# Patient Record
Sex: Female | Born: 1960 | Race: White | Hispanic: No | Smoking: Current every day smoker
Health system: Southern US, Community
[De-identification: ages and names within clinical notes are randomized; demographics above are authoritative.]

## PROBLEM LIST (undated history)

## (undated) DIAGNOSIS — Z973 Presence of spectacles and contact lenses: Secondary | ICD-10-CM

## (undated) DIAGNOSIS — F419 Anxiety disorder, unspecified: Secondary | ICD-10-CM

## (undated) DIAGNOSIS — L732 Hidradenitis suppurativa: Secondary | ICD-10-CM

## (undated) DIAGNOSIS — Z8719 Personal history of other diseases of the digestive system: Secondary | ICD-10-CM

## (undated) DIAGNOSIS — I10 Essential (primary) hypertension: Secondary | ICD-10-CM

## (undated) DIAGNOSIS — F319 Bipolar disorder, unspecified: Secondary | ICD-10-CM

## (undated) DIAGNOSIS — E042 Nontoxic multinodular goiter: Secondary | ICD-10-CM

## (undated) DIAGNOSIS — E785 Hyperlipidemia, unspecified: Secondary | ICD-10-CM

## (undated) DIAGNOSIS — R7303 Prediabetes: Secondary | ICD-10-CM

## (undated) DIAGNOSIS — F329 Major depressive disorder, single episode, unspecified: Secondary | ICD-10-CM

## (undated) DIAGNOSIS — C50919 Malignant neoplasm of unspecified site of unspecified female breast: Secondary | ICD-10-CM

## (undated) DIAGNOSIS — M797 Fibromyalgia: Secondary | ICD-10-CM

## (undated) DIAGNOSIS — K219 Gastro-esophageal reflux disease without esophagitis: Secondary | ICD-10-CM

## (undated) DIAGNOSIS — F32A Depression, unspecified: Secondary | ICD-10-CM

## (undated) DIAGNOSIS — G4730 Sleep apnea, unspecified: Secondary | ICD-10-CM

## (undated) DIAGNOSIS — M1990 Unspecified osteoarthritis, unspecified site: Secondary | ICD-10-CM

## (undated) HISTORY — DX: Malignant neoplasm of unspecified site of unspecified female breast: C50.919

## (undated) HISTORY — PX: TUBAL LIGATION: SHX77

## (undated) HISTORY — PX: CHOLECYSTECTOMY: SHX55

## (undated) HISTORY — PX: GALLBLADDER SURGERY: SHX652

## (undated) HISTORY — DX: Hidradenitis suppurativa: L73.2

---
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
## 1998-06-07 HISTORY — PX: SHOULDER ARTHROSCOPY: SHX128

## 2007-06-08 HISTORY — PX: BREAST BIOPSY: SHX20

## 2011-04-08 HISTORY — PX: ABLATION: SHX5711

## 2011-06-18 DIAGNOSIS — F3131 Bipolar disorder, current episode depressed, mild: Secondary | ICD-10-CM | POA: Diagnosis not present

## 2011-07-19 DIAGNOSIS — F3131 Bipolar disorder, current episode depressed, mild: Secondary | ICD-10-CM | POA: Diagnosis not present

## 2011-08-09 DIAGNOSIS — E785 Hyperlipidemia, unspecified: Secondary | ICD-10-CM | POA: Diagnosis not present

## 2011-08-09 DIAGNOSIS — E559 Vitamin D deficiency, unspecified: Secondary | ICD-10-CM | POA: Diagnosis not present

## 2011-08-09 DIAGNOSIS — E039 Hypothyroidism, unspecified: Secondary | ICD-10-CM | POA: Diagnosis not present

## 2011-08-09 DIAGNOSIS — E7800 Pure hypercholesterolemia, unspecified: Secondary | ICD-10-CM | POA: Diagnosis not present

## 2011-08-09 DIAGNOSIS — T887XXA Unspecified adverse effect of drug or medicament, initial encounter: Secondary | ICD-10-CM | POA: Diagnosis not present

## 2011-08-09 DIAGNOSIS — D649 Anemia, unspecified: Secondary | ICD-10-CM | POA: Diagnosis not present

## 2011-08-17 DIAGNOSIS — F3131 Bipolar disorder, current episode depressed, mild: Secondary | ICD-10-CM | POA: Diagnosis not present

## 2011-09-20 DIAGNOSIS — D1801 Hemangioma of skin and subcutaneous tissue: Secondary | ICD-10-CM | POA: Diagnosis not present

## 2011-09-20 DIAGNOSIS — L42 Pityriasis rosea: Secondary | ICD-10-CM | POA: Diagnosis not present

## 2011-09-27 DIAGNOSIS — L42 Pityriasis rosea: Secondary | ICD-10-CM | POA: Diagnosis not present

## 2011-09-27 DIAGNOSIS — F3131 Bipolar disorder, current episode depressed, mild: Secondary | ICD-10-CM | POA: Diagnosis not present

## 2011-09-28 ENCOUNTER — Ambulatory Visit: Payer: Self-pay | Admitting: Pain Medicine

## 2011-09-28 DIAGNOSIS — M25519 Pain in unspecified shoulder: Secondary | ICD-10-CM | POA: Diagnosis not present

## 2011-09-28 DIAGNOSIS — Z7982 Long term (current) use of aspirin: Secondary | ICD-10-CM | POA: Diagnosis not present

## 2011-09-28 DIAGNOSIS — K449 Diaphragmatic hernia without obstruction or gangrene: Secondary | ICD-10-CM | POA: Diagnosis not present

## 2011-09-28 DIAGNOSIS — M542 Cervicalgia: Secondary | ICD-10-CM | POA: Diagnosis not present

## 2011-09-28 DIAGNOSIS — M549 Dorsalgia, unspecified: Secondary | ICD-10-CM | POA: Diagnosis not present

## 2011-09-28 DIAGNOSIS — R51 Headache: Secondary | ICD-10-CM | POA: Diagnosis not present

## 2011-09-28 DIAGNOSIS — N200 Calculus of kidney: Secondary | ICD-10-CM | POA: Diagnosis not present

## 2011-09-28 DIAGNOSIS — Z79899 Other long term (current) drug therapy: Secondary | ICD-10-CM | POA: Diagnosis not present

## 2011-09-29 ENCOUNTER — Ambulatory Visit: Payer: Self-pay | Admitting: Internal Medicine

## 2011-09-29 DIAGNOSIS — Z1231 Encounter for screening mammogram for malignant neoplasm of breast: Secondary | ICD-10-CM | POA: Diagnosis not present

## 2011-10-06 ENCOUNTER — Ambulatory Visit: Payer: Self-pay | Admitting: Pain Medicine

## 2011-10-06 DIAGNOSIS — I10 Essential (primary) hypertension: Secondary | ICD-10-CM | POA: Diagnosis not present

## 2011-10-06 DIAGNOSIS — E785 Hyperlipidemia, unspecified: Secondary | ICD-10-CM | POA: Diagnosis not present

## 2011-10-06 DIAGNOSIS — R51 Headache: Secondary | ICD-10-CM | POA: Diagnosis not present

## 2011-10-06 DIAGNOSIS — M545 Low back pain, unspecified: Secondary | ICD-10-CM | POA: Diagnosis not present

## 2011-10-06 DIAGNOSIS — M75100 Unspecified rotator cuff tear or rupture of unspecified shoulder, not specified as traumatic: Secondary | ICD-10-CM | POA: Diagnosis not present

## 2011-10-06 DIAGNOSIS — S83006A Unspecified dislocation of unspecified patella, initial encounter: Secondary | ICD-10-CM | POA: Diagnosis not present

## 2011-10-06 DIAGNOSIS — M546 Pain in thoracic spine: Secondary | ICD-10-CM | POA: Diagnosis not present

## 2011-10-06 DIAGNOSIS — M5412 Radiculopathy, cervical region: Secondary | ICD-10-CM | POA: Diagnosis not present

## 2011-10-06 DIAGNOSIS — IMO0002 Reserved for concepts with insufficient information to code with codable children: Secondary | ICD-10-CM | POA: Diagnosis not present

## 2011-10-06 DIAGNOSIS — M542 Cervicalgia: Secondary | ICD-10-CM | POA: Diagnosis not present

## 2011-10-06 DIAGNOSIS — Z79899 Other long term (current) drug therapy: Secondary | ICD-10-CM | POA: Diagnosis not present

## 2011-10-07 DIAGNOSIS — S83006A Unspecified dislocation of unspecified patella, initial encounter: Secondary | ICD-10-CM | POA: Diagnosis not present

## 2011-10-07 DIAGNOSIS — E785 Hyperlipidemia, unspecified: Secondary | ICD-10-CM | POA: Diagnosis not present

## 2011-10-07 DIAGNOSIS — M75100 Unspecified rotator cuff tear or rupture of unspecified shoulder, not specified as traumatic: Secondary | ICD-10-CM | POA: Diagnosis not present

## 2011-10-07 DIAGNOSIS — I10 Essential (primary) hypertension: Secondary | ICD-10-CM | POA: Diagnosis not present

## 2011-10-21 DIAGNOSIS — M75100 Unspecified rotator cuff tear or rupture of unspecified shoulder, not specified as traumatic: Secondary | ICD-10-CM | POA: Diagnosis not present

## 2011-10-21 DIAGNOSIS — S83006A Unspecified dislocation of unspecified patella, initial encounter: Secondary | ICD-10-CM | POA: Diagnosis not present

## 2011-10-21 DIAGNOSIS — F3131 Bipolar disorder, current episode depressed, mild: Secondary | ICD-10-CM | POA: Diagnosis not present

## 2011-10-21 DIAGNOSIS — I10 Essential (primary) hypertension: Secondary | ICD-10-CM | POA: Diagnosis not present

## 2011-10-21 DIAGNOSIS — E785 Hyperlipidemia, unspecified: Secondary | ICD-10-CM | POA: Diagnosis not present

## 2011-10-25 ENCOUNTER — Ambulatory Visit: Payer: Self-pay | Admitting: Internal Medicine

## 2011-10-25 DIAGNOSIS — N289 Disorder of kidney and ureter, unspecified: Secondary | ICD-10-CM | POA: Diagnosis not present

## 2011-10-25 DIAGNOSIS — I10 Essential (primary) hypertension: Secondary | ICD-10-CM | POA: Diagnosis not present

## 2011-11-04 ENCOUNTER — Ambulatory Visit: Payer: Self-pay | Admitting: Pain Medicine

## 2011-11-04 DIAGNOSIS — M542 Cervicalgia: Secondary | ICD-10-CM | POA: Diagnosis not present

## 2011-11-04 DIAGNOSIS — Z79899 Other long term (current) drug therapy: Secondary | ICD-10-CM | POA: Diagnosis not present

## 2011-11-04 DIAGNOSIS — M546 Pain in thoracic spine: Secondary | ICD-10-CM | POA: Diagnosis not present

## 2011-11-04 DIAGNOSIS — R51 Headache: Secondary | ICD-10-CM | POA: Diagnosis not present

## 2011-11-04 DIAGNOSIS — M4712 Other spondylosis with myelopathy, cervical region: Secondary | ICD-10-CM | POA: Diagnosis not present

## 2011-11-16 DIAGNOSIS — F3131 Bipolar disorder, current episode depressed, mild: Secondary | ICD-10-CM | POA: Diagnosis not present

## 2011-11-17 ENCOUNTER — Ambulatory Visit: Payer: Self-pay | Admitting: Pain Medicine

## 2011-11-17 DIAGNOSIS — M546 Pain in thoracic spine: Secondary | ICD-10-CM | POA: Diagnosis not present

## 2011-11-17 DIAGNOSIS — M47812 Spondylosis without myelopathy or radiculopathy, cervical region: Secondary | ICD-10-CM | POA: Diagnosis not present

## 2011-11-17 DIAGNOSIS — IMO0001 Reserved for inherently not codable concepts without codable children: Secondary | ICD-10-CM | POA: Diagnosis not present

## 2011-11-17 DIAGNOSIS — M542 Cervicalgia: Secondary | ICD-10-CM | POA: Diagnosis not present

## 2011-11-17 DIAGNOSIS — M79609 Pain in unspecified limb: Secondary | ICD-10-CM | POA: Diagnosis not present

## 2011-11-17 DIAGNOSIS — R51 Headache: Secondary | ICD-10-CM | POA: Diagnosis not present

## 2011-11-17 DIAGNOSIS — Z79899 Other long term (current) drug therapy: Secondary | ICD-10-CM | POA: Diagnosis not present

## 2011-11-17 DIAGNOSIS — D1809 Hemangioma of other sites: Secondary | ICD-10-CM | POA: Diagnosis not present

## 2011-11-17 DIAGNOSIS — IMO0002 Reserved for concepts with insufficient information to code with codable children: Secondary | ICD-10-CM | POA: Diagnosis not present

## 2011-11-24 DIAGNOSIS — I10 Essential (primary) hypertension: Secondary | ICD-10-CM | POA: Diagnosis not present

## 2011-11-24 DIAGNOSIS — E785 Hyperlipidemia, unspecified: Secondary | ICD-10-CM | POA: Diagnosis not present

## 2011-11-24 DIAGNOSIS — M75100 Unspecified rotator cuff tear or rupture of unspecified shoulder, not specified as traumatic: Secondary | ICD-10-CM | POA: Diagnosis not present

## 2011-11-24 DIAGNOSIS — S83006A Unspecified dislocation of unspecified patella, initial encounter: Secondary | ICD-10-CM | POA: Diagnosis not present

## 2011-12-07 DIAGNOSIS — F3131 Bipolar disorder, current episode depressed, mild: Secondary | ICD-10-CM | POA: Diagnosis not present

## 2011-12-21 ENCOUNTER — Ambulatory Visit: Payer: Self-pay | Admitting: Pain Medicine

## 2011-12-21 DIAGNOSIS — R51 Headache: Secondary | ICD-10-CM | POA: Diagnosis not present

## 2011-12-21 DIAGNOSIS — M47812 Spondylosis without myelopathy or radiculopathy, cervical region: Secondary | ICD-10-CM | POA: Diagnosis not present

## 2011-12-21 DIAGNOSIS — M542 Cervicalgia: Secondary | ICD-10-CM | POA: Diagnosis not present

## 2011-12-21 DIAGNOSIS — M899 Disorder of bone, unspecified: Secondary | ICD-10-CM | POA: Diagnosis not present

## 2011-12-21 DIAGNOSIS — M545 Low back pain, unspecified: Secondary | ICD-10-CM | POA: Diagnosis not present

## 2011-12-21 DIAGNOSIS — M546 Pain in thoracic spine: Secondary | ICD-10-CM | POA: Diagnosis not present

## 2011-12-21 DIAGNOSIS — M79609 Pain in unspecified limb: Secondary | ICD-10-CM | POA: Diagnosis not present

## 2011-12-21 DIAGNOSIS — Z79899 Other long term (current) drug therapy: Secondary | ICD-10-CM | POA: Diagnosis not present

## 2011-12-21 DIAGNOSIS — M19019 Primary osteoarthritis, unspecified shoulder: Secondary | ICD-10-CM | POA: Diagnosis not present

## 2011-12-21 DIAGNOSIS — G8929 Other chronic pain: Secondary | ICD-10-CM | POA: Diagnosis not present

## 2011-12-21 DIAGNOSIS — IMO0002 Reserved for concepts with insufficient information to code with codable children: Secondary | ICD-10-CM | POA: Diagnosis not present

## 2011-12-29 ENCOUNTER — Ambulatory Visit: Payer: Self-pay | Admitting: Pain Medicine

## 2011-12-29 DIAGNOSIS — M67919 Unspecified disorder of synovium and tendon, unspecified shoulder: Secondary | ICD-10-CM | POA: Diagnosis not present

## 2011-12-29 DIAGNOSIS — M25519 Pain in unspecified shoulder: Secondary | ICD-10-CM | POA: Diagnosis not present

## 2011-12-29 DIAGNOSIS — M542 Cervicalgia: Secondary | ICD-10-CM | POA: Diagnosis not present

## 2011-12-29 DIAGNOSIS — M79609 Pain in unspecified limb: Secondary | ICD-10-CM | POA: Diagnosis not present

## 2011-12-29 DIAGNOSIS — M62838 Other muscle spasm: Secondary | ICD-10-CM | POA: Diagnosis not present

## 2011-12-29 DIAGNOSIS — M47812 Spondylosis without myelopathy or radiculopathy, cervical region: Secondary | ICD-10-CM | POA: Diagnosis not present

## 2011-12-29 DIAGNOSIS — M545 Low back pain, unspecified: Secondary | ICD-10-CM | POA: Diagnosis not present

## 2012-01-10 DIAGNOSIS — F3131 Bipolar disorder, current episode depressed, mild: Secondary | ICD-10-CM | POA: Diagnosis not present

## 2012-02-15 DIAGNOSIS — F3131 Bipolar disorder, current episode depressed, mild: Secondary | ICD-10-CM | POA: Diagnosis not present

## 2012-02-23 DIAGNOSIS — Z23 Encounter for immunization: Secondary | ICD-10-CM | POA: Diagnosis not present

## 2012-03-20 DIAGNOSIS — F3131 Bipolar disorder, current episode depressed, mild: Secondary | ICD-10-CM | POA: Diagnosis not present

## 2012-04-24 DIAGNOSIS — E785 Hyperlipidemia, unspecified: Secondary | ICD-10-CM | POA: Diagnosis not present

## 2012-04-24 DIAGNOSIS — J209 Acute bronchitis, unspecified: Secondary | ICD-10-CM | POA: Diagnosis not present

## 2012-04-24 DIAGNOSIS — I10 Essential (primary) hypertension: Secondary | ICD-10-CM | POA: Diagnosis not present

## 2012-05-09 DIAGNOSIS — F3131 Bipolar disorder, current episode depressed, mild: Secondary | ICD-10-CM | POA: Diagnosis not present

## 2012-05-12 DIAGNOSIS — E785 Hyperlipidemia, unspecified: Secondary | ICD-10-CM | POA: Diagnosis not present

## 2012-05-12 DIAGNOSIS — I10 Essential (primary) hypertension: Secondary | ICD-10-CM | POA: Diagnosis not present

## 2012-05-12 DIAGNOSIS — F3130 Bipolar disorder, current episode depressed, mild or moderate severity, unspecified: Secondary | ICD-10-CM | POA: Diagnosis not present

## 2012-05-12 DIAGNOSIS — K219 Gastro-esophageal reflux disease without esophagitis: Secondary | ICD-10-CM | POA: Diagnosis not present

## 2012-05-22 DIAGNOSIS — E785 Hyperlipidemia, unspecified: Secondary | ICD-10-CM | POA: Diagnosis not present

## 2012-05-22 DIAGNOSIS — I10 Essential (primary) hypertension: Secondary | ICD-10-CM | POA: Diagnosis not present

## 2012-05-22 DIAGNOSIS — J45991 Cough variant asthma: Secondary | ICD-10-CM | POA: Diagnosis not present

## 2012-05-22 DIAGNOSIS — M75100 Unspecified rotator cuff tear or rupture of unspecified shoulder, not specified as traumatic: Secondary | ICD-10-CM | POA: Diagnosis not present

## 2012-05-25 DIAGNOSIS — F411 Generalized anxiety disorder: Secondary | ICD-10-CM | POA: Diagnosis not present

## 2012-05-25 DIAGNOSIS — F3130 Bipolar disorder, current episode depressed, mild or moderate severity, unspecified: Secondary | ICD-10-CM | POA: Diagnosis not present

## 2012-05-25 DIAGNOSIS — F4001 Agoraphobia with panic disorder: Secondary | ICD-10-CM | POA: Diagnosis not present

## 2012-05-25 DIAGNOSIS — Z598 Other problems related to housing and economic circumstances: Secondary | ICD-10-CM | POA: Diagnosis not present

## 2012-06-12 DIAGNOSIS — F3162 Bipolar disorder, current episode mixed, moderate: Secondary | ICD-10-CM | POA: Diagnosis not present

## 2012-06-12 DIAGNOSIS — F4001 Agoraphobia with panic disorder: Secondary | ICD-10-CM | POA: Diagnosis not present

## 2012-06-22 DIAGNOSIS — F3131 Bipolar disorder, current episode depressed, mild: Secondary | ICD-10-CM | POA: Diagnosis not present

## 2012-07-03 DIAGNOSIS — F3132 Bipolar disorder, current episode depressed, moderate: Secondary | ICD-10-CM | POA: Diagnosis not present

## 2012-07-03 DIAGNOSIS — F4001 Agoraphobia with panic disorder: Secondary | ICD-10-CM | POA: Diagnosis not present

## 2012-07-24 DIAGNOSIS — F3132 Bipolar disorder, current episode depressed, moderate: Secondary | ICD-10-CM | POA: Diagnosis not present

## 2012-07-24 DIAGNOSIS — F3131 Bipolar disorder, current episode depressed, mild: Secondary | ICD-10-CM | POA: Diagnosis not present

## 2012-07-24 DIAGNOSIS — F4001 Agoraphobia with panic disorder: Secondary | ICD-10-CM | POA: Diagnosis not present

## 2012-07-25 DIAGNOSIS — I10 Essential (primary) hypertension: Secondary | ICD-10-CM | POA: Diagnosis not present

## 2012-07-25 DIAGNOSIS — J45991 Cough variant asthma: Secondary | ICD-10-CM | POA: Diagnosis not present

## 2012-07-25 DIAGNOSIS — Z0000 Encounter for general adult medical examination without abnormal findings: Secondary | ICD-10-CM | POA: Diagnosis not present

## 2012-07-25 DIAGNOSIS — Z131 Encounter for screening for diabetes mellitus: Secondary | ICD-10-CM | POA: Diagnosis not present

## 2012-07-25 DIAGNOSIS — M75100 Unspecified rotator cuff tear or rupture of unspecified shoulder, not specified as traumatic: Secondary | ICD-10-CM | POA: Diagnosis not present

## 2012-08-02 DIAGNOSIS — I10 Essential (primary) hypertension: Secondary | ICD-10-CM | POA: Diagnosis not present

## 2012-08-22 DIAGNOSIS — F319 Bipolar disorder, unspecified: Secondary | ICD-10-CM | POA: Diagnosis not present

## 2012-09-13 DIAGNOSIS — F3132 Bipolar disorder, current episode depressed, moderate: Secondary | ICD-10-CM | POA: Diagnosis not present

## 2012-09-13 DIAGNOSIS — F4001 Agoraphobia with panic disorder: Secondary | ICD-10-CM | POA: Diagnosis not present

## 2012-10-04 DIAGNOSIS — F4001 Agoraphobia with panic disorder: Secondary | ICD-10-CM | POA: Diagnosis not present

## 2012-10-04 DIAGNOSIS — F3132 Bipolar disorder, current episode depressed, moderate: Secondary | ICD-10-CM | POA: Diagnosis not present

## 2012-10-10 DIAGNOSIS — IMO0002 Reserved for concepts with insufficient information to code with codable children: Secondary | ICD-10-CM | POA: Diagnosis not present

## 2012-10-10 DIAGNOSIS — M5030 Other cervical disc degeneration, unspecified cervical region: Secondary | ICD-10-CM | POA: Diagnosis not present

## 2012-10-10 DIAGNOSIS — M9981 Other biomechanical lesions of cervical region: Secondary | ICD-10-CM | POA: Diagnosis not present

## 2012-10-10 DIAGNOSIS — M999 Biomechanical lesion, unspecified: Secondary | ICD-10-CM | POA: Diagnosis not present

## 2012-10-13 DIAGNOSIS — M5030 Other cervical disc degeneration, unspecified cervical region: Secondary | ICD-10-CM | POA: Diagnosis not present

## 2012-10-13 DIAGNOSIS — M999 Biomechanical lesion, unspecified: Secondary | ICD-10-CM | POA: Diagnosis not present

## 2012-10-13 DIAGNOSIS — M9981 Other biomechanical lesions of cervical region: Secondary | ICD-10-CM | POA: Diagnosis not present

## 2012-10-13 DIAGNOSIS — IMO0002 Reserved for concepts with insufficient information to code with codable children: Secondary | ICD-10-CM | POA: Diagnosis not present

## 2012-10-17 DIAGNOSIS — IMO0002 Reserved for concepts with insufficient information to code with codable children: Secondary | ICD-10-CM | POA: Diagnosis not present

## 2012-10-17 DIAGNOSIS — M999 Biomechanical lesion, unspecified: Secondary | ICD-10-CM | POA: Diagnosis not present

## 2012-10-17 DIAGNOSIS — M5030 Other cervical disc degeneration, unspecified cervical region: Secondary | ICD-10-CM | POA: Diagnosis not present

## 2012-10-17 DIAGNOSIS — M9981 Other biomechanical lesions of cervical region: Secondary | ICD-10-CM | POA: Diagnosis not present

## 2012-10-25 DIAGNOSIS — IMO0002 Reserved for concepts with insufficient information to code with codable children: Secondary | ICD-10-CM | POA: Diagnosis not present

## 2012-10-25 DIAGNOSIS — M9981 Other biomechanical lesions of cervical region: Secondary | ICD-10-CM | POA: Diagnosis not present

## 2012-10-25 DIAGNOSIS — M5030 Other cervical disc degeneration, unspecified cervical region: Secondary | ICD-10-CM | POA: Diagnosis not present

## 2012-10-25 DIAGNOSIS — M999 Biomechanical lesion, unspecified: Secondary | ICD-10-CM | POA: Diagnosis not present

## 2012-11-01 DIAGNOSIS — F4001 Agoraphobia with panic disorder: Secondary | ICD-10-CM | POA: Diagnosis not present

## 2012-11-01 DIAGNOSIS — F3132 Bipolar disorder, current episode depressed, moderate: Secondary | ICD-10-CM | POA: Diagnosis not present

## 2012-11-03 DIAGNOSIS — M5030 Other cervical disc degeneration, unspecified cervical region: Secondary | ICD-10-CM | POA: Diagnosis not present

## 2012-11-03 DIAGNOSIS — M9981 Other biomechanical lesions of cervical region: Secondary | ICD-10-CM | POA: Diagnosis not present

## 2012-11-03 DIAGNOSIS — M5137 Other intervertebral disc degeneration, lumbosacral region: Secondary | ICD-10-CM | POA: Diagnosis not present

## 2012-11-03 DIAGNOSIS — M999 Biomechanical lesion, unspecified: Secondary | ICD-10-CM | POA: Diagnosis not present

## 2012-11-07 DIAGNOSIS — F3131 Bipolar disorder, current episode depressed, mild: Secondary | ICD-10-CM | POA: Diagnosis not present

## 2012-11-09 DIAGNOSIS — M999 Biomechanical lesion, unspecified: Secondary | ICD-10-CM | POA: Diagnosis not present

## 2012-11-09 DIAGNOSIS — M5137 Other intervertebral disc degeneration, lumbosacral region: Secondary | ICD-10-CM | POA: Diagnosis not present

## 2012-11-09 DIAGNOSIS — M9981 Other biomechanical lesions of cervical region: Secondary | ICD-10-CM | POA: Diagnosis not present

## 2012-11-09 DIAGNOSIS — M5030 Other cervical disc degeneration, unspecified cervical region: Secondary | ICD-10-CM | POA: Diagnosis not present

## 2012-11-22 DIAGNOSIS — M9981 Other biomechanical lesions of cervical region: Secondary | ICD-10-CM | POA: Diagnosis not present

## 2012-11-22 DIAGNOSIS — M5030 Other cervical disc degeneration, unspecified cervical region: Secondary | ICD-10-CM | POA: Diagnosis not present

## 2012-11-22 DIAGNOSIS — IMO0002 Reserved for concepts with insufficient information to code with codable children: Secondary | ICD-10-CM | POA: Diagnosis not present

## 2012-11-22 DIAGNOSIS — M999 Biomechanical lesion, unspecified: Secondary | ICD-10-CM | POA: Diagnosis not present

## 2012-11-23 DIAGNOSIS — M75100 Unspecified rotator cuff tear or rupture of unspecified shoulder, not specified as traumatic: Secondary | ICD-10-CM | POA: Diagnosis not present

## 2012-11-23 DIAGNOSIS — I10 Essential (primary) hypertension: Secondary | ICD-10-CM | POA: Diagnosis not present

## 2012-11-27 DIAGNOSIS — M75100 Unspecified rotator cuff tear or rupture of unspecified shoulder, not specified as traumatic: Secondary | ICD-10-CM | POA: Diagnosis not present

## 2012-11-28 DIAGNOSIS — F3132 Bipolar disorder, current episode depressed, moderate: Secondary | ICD-10-CM | POA: Diagnosis not present

## 2012-11-28 DIAGNOSIS — F4001 Agoraphobia with panic disorder: Secondary | ICD-10-CM | POA: Diagnosis not present

## 2012-11-29 DIAGNOSIS — M5030 Other cervical disc degeneration, unspecified cervical region: Secondary | ICD-10-CM | POA: Diagnosis not present

## 2012-11-29 DIAGNOSIS — IMO0002 Reserved for concepts with insufficient information to code with codable children: Secondary | ICD-10-CM | POA: Diagnosis not present

## 2012-11-29 DIAGNOSIS — M999 Biomechanical lesion, unspecified: Secondary | ICD-10-CM | POA: Diagnosis not present

## 2012-11-29 DIAGNOSIS — M9981 Other biomechanical lesions of cervical region: Secondary | ICD-10-CM | POA: Diagnosis not present

## 2012-12-13 DIAGNOSIS — M9981 Other biomechanical lesions of cervical region: Secondary | ICD-10-CM | POA: Diagnosis not present

## 2012-12-13 DIAGNOSIS — IMO0002 Reserved for concepts with insufficient information to code with codable children: Secondary | ICD-10-CM | POA: Diagnosis not present

## 2012-12-13 DIAGNOSIS — M5030 Other cervical disc degeneration, unspecified cervical region: Secondary | ICD-10-CM | POA: Diagnosis not present

## 2012-12-13 DIAGNOSIS — M999 Biomechanical lesion, unspecified: Secondary | ICD-10-CM | POA: Diagnosis not present

## 2012-12-14 ENCOUNTER — Ambulatory Visit: Payer: Self-pay | Admitting: Internal Medicine

## 2012-12-14 DIAGNOSIS — R922 Inconclusive mammogram: Secondary | ICD-10-CM | POA: Diagnosis not present

## 2012-12-14 DIAGNOSIS — Z1231 Encounter for screening mammogram for malignant neoplasm of breast: Secondary | ICD-10-CM | POA: Diagnosis not present

## 2012-12-14 DIAGNOSIS — R928 Other abnormal and inconclusive findings on diagnostic imaging of breast: Secondary | ICD-10-CM | POA: Diagnosis not present

## 2012-12-21 DIAGNOSIS — F3132 Bipolar disorder, current episode depressed, moderate: Secondary | ICD-10-CM | POA: Diagnosis not present

## 2012-12-21 DIAGNOSIS — F4001 Agoraphobia with panic disorder: Secondary | ICD-10-CM | POA: Diagnosis not present

## 2013-01-25 DIAGNOSIS — F3162 Bipolar disorder, current episode mixed, moderate: Secondary | ICD-10-CM | POA: Diagnosis not present

## 2013-01-25 DIAGNOSIS — F4001 Agoraphobia with panic disorder: Secondary | ICD-10-CM | POA: Diagnosis not present

## 2013-01-29 DIAGNOSIS — F3131 Bipolar disorder, current episode depressed, mild: Secondary | ICD-10-CM | POA: Diagnosis not present

## 2013-02-19 DIAGNOSIS — F3132 Bipolar disorder, current episode depressed, moderate: Secondary | ICD-10-CM | POA: Diagnosis not present

## 2013-02-19 DIAGNOSIS — F4001 Agoraphobia with panic disorder: Secondary | ICD-10-CM | POA: Diagnosis not present

## 2013-03-15 DIAGNOSIS — F4001 Agoraphobia with panic disorder: Secondary | ICD-10-CM | POA: Diagnosis not present

## 2013-04-10 DIAGNOSIS — F4001 Agoraphobia with panic disorder: Secondary | ICD-10-CM | POA: Diagnosis not present

## 2013-04-27 DIAGNOSIS — F3131 Bipolar disorder, current episode depressed, mild: Secondary | ICD-10-CM | POA: Diagnosis not present

## 2013-06-11 DIAGNOSIS — F3131 Bipolar disorder, current episode depressed, mild: Secondary | ICD-10-CM | POA: Diagnosis not present

## 2013-06-12 DIAGNOSIS — F4001 Agoraphobia with panic disorder: Secondary | ICD-10-CM | POA: Diagnosis not present

## 2013-06-26 DIAGNOSIS — F3131 Bipolar disorder, current episode depressed, mild: Secondary | ICD-10-CM | POA: Diagnosis not present

## 2013-07-03 DIAGNOSIS — F4001 Agoraphobia with panic disorder: Secondary | ICD-10-CM | POA: Diagnosis not present

## 2013-07-27 DIAGNOSIS — F3131 Bipolar disorder, current episode depressed, mild: Secondary | ICD-10-CM | POA: Diagnosis not present

## 2013-08-08 ENCOUNTER — Ambulatory Visit: Payer: Self-pay | Admitting: Internal Medicine

## 2013-08-08 DIAGNOSIS — M75100 Unspecified rotator cuff tear or rupture of unspecified shoulder, not specified as traumatic: Secondary | ICD-10-CM | POA: Diagnosis not present

## 2013-08-08 DIAGNOSIS — M5030 Other cervical disc degeneration, unspecified cervical region: Secondary | ICD-10-CM | POA: Diagnosis not present

## 2013-08-08 DIAGNOSIS — E785 Hyperlipidemia, unspecified: Secondary | ICD-10-CM | POA: Diagnosis not present

## 2013-08-08 DIAGNOSIS — M47812 Spondylosis without myelopathy or radiculopathy, cervical region: Secondary | ICD-10-CM | POA: Diagnosis not present

## 2013-08-08 DIAGNOSIS — M25519 Pain in unspecified shoulder: Secondary | ICD-10-CM | POA: Diagnosis not present

## 2013-08-08 DIAGNOSIS — IMO0002 Reserved for concepts with insufficient information to code with codable children: Secondary | ICD-10-CM | POA: Diagnosis not present

## 2013-08-08 DIAGNOSIS — I10 Essential (primary) hypertension: Secondary | ICD-10-CM | POA: Diagnosis not present

## 2013-08-08 DIAGNOSIS — M19019 Primary osteoarthritis, unspecified shoulder: Secondary | ICD-10-CM | POA: Diagnosis not present

## 2013-08-14 DIAGNOSIS — M75100 Unspecified rotator cuff tear or rupture of unspecified shoulder, not specified as traumatic: Secondary | ICD-10-CM | POA: Diagnosis not present

## 2013-08-14 DIAGNOSIS — F3131 Bipolar disorder, current episode depressed, mild: Secondary | ICD-10-CM | POA: Diagnosis not present

## 2013-08-14 DIAGNOSIS — J209 Acute bronchitis, unspecified: Secondary | ICD-10-CM | POA: Diagnosis not present

## 2013-08-14 DIAGNOSIS — IMO0002 Reserved for concepts with insufficient information to code with codable children: Secondary | ICD-10-CM | POA: Diagnosis not present

## 2013-08-14 DIAGNOSIS — E785 Hyperlipidemia, unspecified: Secondary | ICD-10-CM | POA: Diagnosis not present

## 2013-08-14 DIAGNOSIS — D649 Anemia, unspecified: Secondary | ICD-10-CM | POA: Diagnosis not present

## 2013-08-14 DIAGNOSIS — E109 Type 1 diabetes mellitus without complications: Secondary | ICD-10-CM | POA: Diagnosis not present

## 2013-08-14 DIAGNOSIS — I10 Essential (primary) hypertension: Secondary | ICD-10-CM | POA: Diagnosis not present

## 2013-08-17 DIAGNOSIS — F4001 Agoraphobia with panic disorder: Secondary | ICD-10-CM | POA: Diagnosis not present

## 2013-09-10 DIAGNOSIS — F3131 Bipolar disorder, current episode depressed, mild: Secondary | ICD-10-CM | POA: Diagnosis not present

## 2013-09-14 DIAGNOSIS — F4001 Agoraphobia with panic disorder: Secondary | ICD-10-CM | POA: Diagnosis not present

## 2013-10-23 DIAGNOSIS — F4001 Agoraphobia with panic disorder: Secondary | ICD-10-CM | POA: Diagnosis not present

## 2013-11-16 DIAGNOSIS — F4001 Agoraphobia with panic disorder: Secondary | ICD-10-CM | POA: Diagnosis not present

## 2013-11-16 DIAGNOSIS — F3131 Bipolar disorder, current episode depressed, mild: Secondary | ICD-10-CM | POA: Diagnosis not present

## 2013-12-06 DIAGNOSIS — F4001 Agoraphobia with panic disorder: Secondary | ICD-10-CM | POA: Diagnosis not present

## 2013-12-10 DIAGNOSIS — M542 Cervicalgia: Secondary | ICD-10-CM | POA: Diagnosis not present

## 2013-12-10 DIAGNOSIS — R079 Chest pain, unspecified: Secondary | ICD-10-CM | POA: Diagnosis not present

## 2013-12-10 DIAGNOSIS — G4733 Obstructive sleep apnea (adult) (pediatric): Secondary | ICD-10-CM | POA: Diagnosis not present

## 2013-12-10 DIAGNOSIS — Z0000 Encounter for general adult medical examination without abnormal findings: Secondary | ICD-10-CM | POA: Diagnosis not present

## 2013-12-10 DIAGNOSIS — Z01419 Encounter for gynecological examination (general) (routine) without abnormal findings: Secondary | ICD-10-CM | POA: Diagnosis not present

## 2013-12-11 DIAGNOSIS — R079 Chest pain, unspecified: Secondary | ICD-10-CM | POA: Diagnosis not present

## 2013-12-11 DIAGNOSIS — Z124 Encounter for screening for malignant neoplasm of cervix: Secondary | ICD-10-CM | POA: Diagnosis not present

## 2013-12-11 DIAGNOSIS — Z0142 Encounter for cervical smear to confirm findings of recent normal smear following initial abnormal smear: Secondary | ICD-10-CM | POA: Diagnosis not present

## 2013-12-11 DIAGNOSIS — Z0000 Encounter for general adult medical examination without abnormal findings: Secondary | ICD-10-CM | POA: Diagnosis not present

## 2013-12-13 DIAGNOSIS — R079 Chest pain, unspecified: Secondary | ICD-10-CM | POA: Diagnosis not present

## 2013-12-17 DIAGNOSIS — F3131 Bipolar disorder, current episode depressed, mild: Secondary | ICD-10-CM | POA: Diagnosis not present

## 2013-12-26 DIAGNOSIS — G4730 Sleep apnea, unspecified: Secondary | ICD-10-CM | POA: Diagnosis not present

## 2013-12-31 DIAGNOSIS — M47812 Spondylosis without myelopathy or radiculopathy, cervical region: Secondary | ICD-10-CM | POA: Diagnosis not present

## 2014-01-02 DIAGNOSIS — G4733 Obstructive sleep apnea (adult) (pediatric): Secondary | ICD-10-CM | POA: Diagnosis not present

## 2014-01-14 DIAGNOSIS — G4733 Obstructive sleep apnea (adult) (pediatric): Secondary | ICD-10-CM | POA: Diagnosis not present

## 2014-01-15 ENCOUNTER — Ambulatory Visit: Payer: Self-pay | Admitting: Nurse Practitioner

## 2014-01-15 DIAGNOSIS — M5030 Other cervical disc degeneration, unspecified cervical region: Secondary | ICD-10-CM | POA: Diagnosis not present

## 2014-01-15 DIAGNOSIS — M47812 Spondylosis without myelopathy or radiculopathy, cervical region: Secondary | ICD-10-CM | POA: Diagnosis not present

## 2014-01-17 DIAGNOSIS — F3131 Bipolar disorder, current episode depressed, mild: Secondary | ICD-10-CM | POA: Diagnosis not present

## 2014-01-21 DIAGNOSIS — M542 Cervicalgia: Secondary | ICD-10-CM | POA: Diagnosis not present

## 2014-01-23 DIAGNOSIS — M542 Cervicalgia: Secondary | ICD-10-CM | POA: Diagnosis not present

## 2014-01-23 DIAGNOSIS — G4733 Obstructive sleep apnea (adult) (pediatric): Secondary | ICD-10-CM | POA: Diagnosis not present

## 2014-01-28 DIAGNOSIS — M542 Cervicalgia: Secondary | ICD-10-CM | POA: Diagnosis not present

## 2014-01-30 DIAGNOSIS — M542 Cervicalgia: Secondary | ICD-10-CM | POA: Diagnosis not present

## 2014-02-01 ENCOUNTER — Ambulatory Visit: Payer: Self-pay | Admitting: Internal Medicine

## 2014-02-01 DIAGNOSIS — Z1231 Encounter for screening mammogram for malignant neoplasm of breast: Secondary | ICD-10-CM | POA: Diagnosis not present

## 2014-02-04 DIAGNOSIS — M542 Cervicalgia: Secondary | ICD-10-CM | POA: Diagnosis not present

## 2014-02-06 DIAGNOSIS — M542 Cervicalgia: Secondary | ICD-10-CM | POA: Diagnosis not present

## 2014-02-13 DIAGNOSIS — M542 Cervicalgia: Secondary | ICD-10-CM | POA: Diagnosis not present

## 2014-02-15 DIAGNOSIS — M542 Cervicalgia: Secondary | ICD-10-CM | POA: Diagnosis not present

## 2014-02-18 DIAGNOSIS — M542 Cervicalgia: Secondary | ICD-10-CM | POA: Diagnosis not present

## 2014-02-20 DIAGNOSIS — M542 Cervicalgia: Secondary | ICD-10-CM | POA: Diagnosis not present

## 2014-02-26 DIAGNOSIS — M542 Cervicalgia: Secondary | ICD-10-CM | POA: Diagnosis not present

## 2014-02-28 DIAGNOSIS — M542 Cervicalgia: Secondary | ICD-10-CM | POA: Diagnosis not present

## 2014-03-04 DIAGNOSIS — M542 Cervicalgia: Secondary | ICD-10-CM | POA: Diagnosis not present

## 2014-03-06 DIAGNOSIS — M542 Cervicalgia: Secondary | ICD-10-CM | POA: Diagnosis not present

## 2014-03-13 DIAGNOSIS — M542 Cervicalgia: Secondary | ICD-10-CM | POA: Diagnosis not present

## 2014-03-15 DIAGNOSIS — F3131 Bipolar disorder, current episode depressed, mild: Secondary | ICD-10-CM | POA: Diagnosis not present

## 2014-03-18 DIAGNOSIS — M542 Cervicalgia: Secondary | ICD-10-CM | POA: Diagnosis not present

## 2014-03-20 DIAGNOSIS — M4302 Spondylolysis, cervical region: Secondary | ICD-10-CM | POA: Diagnosis not present

## 2014-03-22 DIAGNOSIS — M542 Cervicalgia: Secondary | ICD-10-CM | POA: Diagnosis not present

## 2014-03-25 DIAGNOSIS — Z23 Encounter for immunization: Secondary | ICD-10-CM | POA: Diagnosis not present

## 2014-03-28 DIAGNOSIS — F319 Bipolar disorder, unspecified: Secondary | ICD-10-CM | POA: Diagnosis not present

## 2014-03-29 DIAGNOSIS — M542 Cervicalgia: Secondary | ICD-10-CM | POA: Diagnosis not present

## 2014-04-01 DIAGNOSIS — B977 Papillomavirus as the cause of diseases classified elsewhere: Secondary | ICD-10-CM | POA: Diagnosis not present

## 2014-04-01 DIAGNOSIS — R87810 Cervical high risk human papillomavirus (HPV) DNA test positive: Secondary | ICD-10-CM | POA: Diagnosis not present

## 2014-04-01 DIAGNOSIS — K626 Ulcer of anus and rectum: Secondary | ICD-10-CM | POA: Diagnosis not present

## 2014-04-01 DIAGNOSIS — K629 Disease of anus and rectum, unspecified: Secondary | ICD-10-CM | POA: Diagnosis not present

## 2014-04-03 DIAGNOSIS — M542 Cervicalgia: Secondary | ICD-10-CM | POA: Diagnosis not present

## 2014-04-09 ENCOUNTER — Ambulatory Visit: Payer: Self-pay | Admitting: Gastroenterology

## 2014-04-09 DIAGNOSIS — Z808 Family history of malignant neoplasm of other organs or systems: Secondary | ICD-10-CM | POA: Diagnosis not present

## 2014-04-09 DIAGNOSIS — K5730 Diverticulosis of large intestine without perforation or abscess without bleeding: Secondary | ICD-10-CM | POA: Diagnosis not present

## 2014-04-09 DIAGNOSIS — Z720 Tobacco use: Secondary | ICD-10-CM | POA: Diagnosis not present

## 2014-04-09 DIAGNOSIS — K2950 Unspecified chronic gastritis without bleeding: Secondary | ICD-10-CM | POA: Diagnosis not present

## 2014-04-09 DIAGNOSIS — Z8489 Family history of other specified conditions: Secondary | ICD-10-CM | POA: Diagnosis not present

## 2014-04-09 DIAGNOSIS — K649 Unspecified hemorrhoids: Secondary | ICD-10-CM | POA: Diagnosis not present

## 2014-04-09 DIAGNOSIS — I10 Essential (primary) hypertension: Secondary | ICD-10-CM | POA: Diagnosis not present

## 2014-04-09 DIAGNOSIS — Z79899 Other long term (current) drug therapy: Secondary | ICD-10-CM | POA: Diagnosis not present

## 2014-04-09 DIAGNOSIS — Z833 Family history of diabetes mellitus: Secondary | ICD-10-CM | POA: Diagnosis not present

## 2014-04-09 DIAGNOSIS — K641 Second degree hemorrhoids: Secondary | ICD-10-CM | POA: Diagnosis not present

## 2014-04-09 DIAGNOSIS — Z823 Family history of stroke: Secondary | ICD-10-CM | POA: Diagnosis not present

## 2014-04-09 DIAGNOSIS — K2970 Gastritis, unspecified, without bleeding: Secondary | ICD-10-CM | POA: Diagnosis not present

## 2014-04-09 DIAGNOSIS — R1013 Epigastric pain: Secondary | ICD-10-CM | POA: Diagnosis not present

## 2014-04-09 DIAGNOSIS — G4730 Sleep apnea, unspecified: Secondary | ICD-10-CM | POA: Diagnosis not present

## 2014-04-09 DIAGNOSIS — Z888 Allergy status to other drugs, medicaments and biological substances status: Secondary | ICD-10-CM | POA: Diagnosis not present

## 2014-04-09 DIAGNOSIS — Z1211 Encounter for screening for malignant neoplasm of colon: Secondary | ICD-10-CM | POA: Diagnosis not present

## 2014-04-12 DIAGNOSIS — F3131 Bipolar disorder, current episode depressed, mild: Secondary | ICD-10-CM | POA: Diagnosis not present

## 2014-04-19 ENCOUNTER — Ambulatory Visit: Payer: Self-pay | Admitting: Internal Medicine

## 2014-04-19 DIAGNOSIS — R042 Hemoptysis: Secondary | ICD-10-CM | POA: Diagnosis not present

## 2014-04-19 DIAGNOSIS — E663 Overweight: Secondary | ICD-10-CM | POA: Diagnosis not present

## 2014-04-19 DIAGNOSIS — Z0389 Encounter for observation for other suspected diseases and conditions ruled out: Secondary | ICD-10-CM | POA: Diagnosis not present

## 2014-04-19 DIAGNOSIS — G4731 Primary central sleep apnea: Secondary | ICD-10-CM | POA: Diagnosis not present

## 2014-05-15 DIAGNOSIS — Z23 Encounter for immunization: Secondary | ICD-10-CM | POA: Diagnosis not present

## 2014-05-27 DIAGNOSIS — F3131 Bipolar disorder, current episode depressed, mild: Secondary | ICD-10-CM | POA: Diagnosis not present

## 2014-06-06 DIAGNOSIS — F4001 Agoraphobia with panic disorder: Secondary | ICD-10-CM | POA: Diagnosis not present

## 2014-06-10 DIAGNOSIS — Z79899 Other long term (current) drug therapy: Secondary | ICD-10-CM | POA: Diagnosis not present

## 2014-06-10 DIAGNOSIS — F3131 Bipolar disorder, current episode depressed, mild: Secondary | ICD-10-CM | POA: Diagnosis not present

## 2014-07-08 DIAGNOSIS — F4001 Agoraphobia with panic disorder: Secondary | ICD-10-CM | POA: Diagnosis not present

## 2014-07-29 DIAGNOSIS — F3131 Bipolar disorder, current episode depressed, mild: Secondary | ICD-10-CM | POA: Diagnosis not present

## 2014-08-06 DIAGNOSIS — F4001 Agoraphobia with panic disorder: Secondary | ICD-10-CM | POA: Diagnosis not present

## 2014-08-27 DIAGNOSIS — F4001 Agoraphobia with panic disorder: Secondary | ICD-10-CM | POA: Diagnosis not present

## 2014-09-12 DIAGNOSIS — H6980 Other specified disorders of Eustachian tube, unspecified ear: Secondary | ICD-10-CM | POA: Diagnosis not present

## 2014-09-12 DIAGNOSIS — H9319 Tinnitus, unspecified ear: Secondary | ICD-10-CM | POA: Diagnosis not present

## 2014-09-12 DIAGNOSIS — J301 Allergic rhinitis due to pollen: Secondary | ICD-10-CM | POA: Diagnosis not present

## 2014-09-12 DIAGNOSIS — H93293 Other abnormal auditory perceptions, bilateral: Secondary | ICD-10-CM | POA: Diagnosis not present

## 2014-09-12 DIAGNOSIS — R42 Dizziness and giddiness: Secondary | ICD-10-CM | POA: Diagnosis not present

## 2014-09-19 DIAGNOSIS — F3131 Bipolar disorder, current episode depressed, mild: Secondary | ICD-10-CM | POA: Diagnosis not present

## 2014-09-30 LAB — SURGICAL PATHOLOGY

## 2014-10-01 DIAGNOSIS — F4001 Agoraphobia with panic disorder: Secondary | ICD-10-CM | POA: Diagnosis not present

## 2014-10-21 DIAGNOSIS — M7550 Bursitis of unspecified shoulder: Secondary | ICD-10-CM | POA: Diagnosis not present

## 2014-10-21 DIAGNOSIS — M19041 Primary osteoarthritis, right hand: Secondary | ICD-10-CM | POA: Diagnosis not present

## 2014-10-21 DIAGNOSIS — M19042 Primary osteoarthritis, left hand: Secondary | ICD-10-CM | POA: Diagnosis not present

## 2014-10-22 DIAGNOSIS — F3131 Bipolar disorder, current episode depressed, mild: Secondary | ICD-10-CM | POA: Diagnosis not present

## 2014-10-22 DIAGNOSIS — R5381 Other malaise: Secondary | ICD-10-CM | POA: Diagnosis not present

## 2014-10-24 DIAGNOSIS — F4001 Agoraphobia with panic disorder: Secondary | ICD-10-CM | POA: Diagnosis not present

## 2014-11-01 DIAGNOSIS — E663 Overweight: Secondary | ICD-10-CM | POA: Diagnosis not present

## 2014-11-01 DIAGNOSIS — G4733 Obstructive sleep apnea (adult) (pediatric): Secondary | ICD-10-CM | POA: Diagnosis not present

## 2014-11-11 DIAGNOSIS — J45991 Cough variant asthma: Secondary | ICD-10-CM | POA: Diagnosis not present

## 2014-11-11 DIAGNOSIS — M7550 Bursitis of unspecified shoulder: Secondary | ICD-10-CM | POA: Diagnosis not present

## 2014-11-11 DIAGNOSIS — I10 Essential (primary) hypertension: Secondary | ICD-10-CM | POA: Diagnosis not present

## 2014-11-15 ENCOUNTER — Ambulatory Visit (INDEPENDENT_AMBULATORY_CARE_PROVIDER_SITE_OTHER): Payer: Medicare Other | Admitting: Licensed Clinical Social Worker

## 2014-11-15 DIAGNOSIS — F3130 Bipolar disorder, current episode depressed, mild or moderate severity, unspecified: Secondary | ICD-10-CM | POA: Diagnosis not present

## 2014-11-15 DIAGNOSIS — F4001 Agoraphobia with panic disorder: Secondary | ICD-10-CM

## 2014-11-15 NOTE — Progress Notes (Signed)
   THERAPIST PROGRESS NOTE  Session Time: 11:14 a.m.- 12:15 p.m.  Participation Level: Active  Behavioral Response: CasualAlertAnxious  Type of Therapy: Family Therapy  Treatment Goals addressed: Anxiety and Coping  Interventions: Strength-based and Supportive  Summary: Tiffany Mathews is a 54 y.o. female who presents with recent set back in her depressed mood that she attributes to recent rainy and dark cloudy days.  Her Psychiatrist recent added an antidepressant and the two are monitoring this to make sure she does not experienced an induced mania. With addition of the anti-depressant in combination with the previous meds, Clarisa is feeling a lessening of depressive symptoms.  "The gloom and doom has lifted."  In an effort to reinvest in her life and work through her social anxieties, client informed LCSW,   "I've signed up for classes at [employer]."  Getting [employer] assistance and will begin an Associates Degree in the Fall 2016. Decklyn brought her daughter, [name], to session.  Daughter shared her beautiful art work with [employer]. The two of them will travel to client's home state of [place] before daughter restarts school in August.  Living arrangements remain comfortable with daughter and estranged spouse. [name] talked about various interests and activities that she and daughter do together to stay active and she continues to enjoy reading.  The two voiced appropriate sadness around missing son, his fiance' and two year old grandson who recently moved back to [place].  No new concerns were voiced.  Suicidal/Homicidal: Nowithout intent/plan  Therapist Response: LCSW commended client's daughter on her beautiful artwork.  Discussed family dynamics and dealing with difficult situations. Reinforced role of thinking errors in the maintenance of symptoms and highlighted client's strengths and resources used over the past several months to continue coping with very stressful life events.  Discussed techniques  available to manage and/or decrease stress reactions given client's high stress. Emphasized revisiting stress management techniques in future sessions.  Ongoing supportive therapy with insight was provided along with much encouragement.  Plan: Return again in 3 weeks.  Diagnosis: Bi-Polar Disorder, Depressed, Moderate   Panic Disorder with Agoraphobia   Miguel Dibble, LCSW 11/15/2014

## 2014-11-19 DIAGNOSIS — F3131 Bipolar disorder, current episode depressed, mild: Secondary | ICD-10-CM | POA: Diagnosis not present

## 2014-12-11 ENCOUNTER — Ambulatory Visit (INDEPENDENT_AMBULATORY_CARE_PROVIDER_SITE_OTHER): Payer: Medicare Other | Admitting: Licensed Clinical Social Worker

## 2014-12-11 DIAGNOSIS — F3160 Bipolar disorder, current episode mixed, unspecified: Secondary | ICD-10-CM

## 2014-12-11 DIAGNOSIS — F4001 Agoraphobia with panic disorder: Secondary | ICD-10-CM
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
## 2014-12-12 DIAGNOSIS — F3160 Bipolar disorder, current episode mixed, unspecified: Secondary | ICD-10-CM | POA: Insufficient documentation

## 2014-12-12 DIAGNOSIS — F4001 Agoraphobia with panic disorder: Secondary | ICD-10-CM | POA: Insufficient documentation

## 2014-12-20 ENCOUNTER — Other Ambulatory Visit: Payer: Self-pay | Admitting: Gastroenterology

## 2015-01-08 DIAGNOSIS — F3131 Bipolar disorder, current episode depressed, mild: Secondary | ICD-10-CM | POA: Diagnosis not present

## 2015-01-09 ENCOUNTER — Ambulatory Visit (INDEPENDENT_AMBULATORY_CARE_PROVIDER_SITE_OTHER): Payer: Medicare Other | Admitting: Licensed Clinical Social Worker

## 2015-01-09 DIAGNOSIS — F3160 Bipolar disorder, current episode mixed, unspecified: Secondary | ICD-10-CM | POA: Diagnosis not present

## 2015-01-09 DIAGNOSIS — F4001 Agoraphobia with panic disorder: Secondary | ICD-10-CM | POA: Diagnosis not present

## 2015-01-09 NOTE — Progress Notes (Signed)
THERAPIST PROGRESS NOTE  Session Time: 1:15 p.m. - 2:20 p.m.  Participation Level: Active  Behavioral Response: NeatAlertAnxious and Depressed  Type of Therapy: Individual Therapy  Treatment Goals addressed: Anxiety and Coping  Interventions: Solution Focused, Strength-based, Supportive and Reframing  Summary: Tiffany Mathews is a 54 y.o. female who returns to OPT to continue addressing coping with anxiety and Bi-Polar. Beaulah continues to track her moods and with increasing hypo-mania during summer months followed by depression in the Fall/Winter months.  Having a consistent schedule helps her to keep moods stable.  She remains compliant with medications and with recent visit to see Dr. Nicolasa Ducking her Psychiatrist.  Angelamarie brought in a list of her medications.  She reported that despite some hypo-mania as evidenced by pressured speech and racing thoughts, she is doing good.  She did not appear hypo-manic today or depressed, remains attentive to personal hygiene and with good insight into signs of worsening Bi-Polar symptoms.  "Socializing is still a thing for me. I think that binge eating is a thing for me too."  Weight gain is of concern to her and associates this to ongoing anxiety and snacking.  Visit to see family in [place] was good and family was supportive and encouraging of both client and her daughter.  Tiffany Mathews's daughter remains compliant with her medications and treatment and she is very pleased with how well her daughter is doing.  The living arrangements remain that she lives with her ex-husband and daughter.  "I stay on my side of the house and he stays on his and we just don't cross paths."  This arrangement appears to work per client and allows her a more active role in her daughter's life.  On a positive note she was found eligible for a [employer] to begin a program at [employer] and classes begin 01/21/15.  "I'm all set."  Tiffany Mathews spoke favorably of the teacher of her program.  "I'm taking  baby steps. I'm pulling myself out of that hole."    Discussed current strategies that she uses to soothe herself and decrease anxiety such as deep breathing and positive self talk.  Client with good insight that there are still thinking styles that interfere with how she views herself and session focused more on identifying strengths and current self-critical self talk.  Tiffany Mathews has various activities that she enjoys such as reading and is hopeful that being out and in school will allow her new opportunities to meet people and get more involved with campus clubs and events.  No new concerns and Deltha able to see her progress and believes that the therapeutic experience remains positive and useful for her.  Goal: "Learning more strategies for decreasing anxiety symptoms."  Suicidal/Homicidal: Negativewithout intent/plan  Therapist Response:   LCSW reinforced importance of identification of irrational fears and beliefs that contribute to anxiety and panic. LCSW provided client with supportive therapy with insight along with emotional and social support to reinforce client's strengths, progress made and available resources. Also commended her on use of previously discussed information and coping strategies to redirect her thoughts.  Gently reinforced and assisted client to re-frame various self-defeating thoughts.  Offered hand-out that explained the fight or flight experience to assist Tiffany Mathews to continue to understand and recognize where she first feels her anxiety so that she can begin to use self-calming/self-soothing strategies.  Also provided hand-out on developing coping cards with examples.  Reassured that she and LCSW could continue to assess the effectiveness of current strategies being  used and work on additional ones.  Plan: Return again in 3-4 weeks.  [name] is aware that she can contact LCSW sooner PRN.  She will keep all appointments and take medications as  prescribed.  Diagnosis: Bi-Polar Disorder, MRE, Mixed, In Early Remission   Panic Disorder with Agoraphobia    Miguel Dibble, LCSW 01/09/2015

## 2015-01-27 DIAGNOSIS — J301 Allergic rhinitis due to pollen: Secondary | ICD-10-CM | POA: Diagnosis not present

## 2015-01-27 DIAGNOSIS — J387 Other diseases of larynx: Secondary | ICD-10-CM | POA: Diagnosis not present

## 2015-01-27 DIAGNOSIS — F458 Other somatoform disorders: Secondary | ICD-10-CM | POA: Diagnosis not present

## 2015-01-27 DIAGNOSIS — H6980 Other specified disorders of Eustachian tube, unspecified ear: Secondary | ICD-10-CM | POA: Diagnosis not present

## 2015-01-27 DIAGNOSIS — Z87891 Personal history of nicotine dependence: Secondary | ICD-10-CM | POA: Diagnosis not present

## 2015-02-12 ENCOUNTER — Ambulatory Visit: Payer: Medicare Other | Admitting: Licensed Clinical Social Worker

## 2015-02-12 DIAGNOSIS — M25562 Pain in left knee: Secondary | ICD-10-CM | POA: Diagnosis not present

## 2015-02-12 DIAGNOSIS — M25862 Other specified joint disorders, left knee: Secondary | ICD-10-CM | POA: Diagnosis not present

## 2015-02-14 ENCOUNTER — Other Ambulatory Visit: Payer: Self-pay | Admitting: Orthopedic Surgery

## 2015-02-14 DIAGNOSIS — M25562 Pain in left knee: Secondary | ICD-10-CM

## 2015-02-14 DIAGNOSIS — R2242 Localized swelling, mass and lump, left lower limb: Secondary | ICD-10-CM

## 2015-02-17 ENCOUNTER — Ambulatory Visit: Admission: RE | Admit: 2015-02-17 | Payer: Medicare Other | Source: Ambulatory Visit

## 2015-02-19 ENCOUNTER — Ambulatory Visit: Payer: Self-pay | Admitting: Licensed Clinical Social Worker

## 2015-02-24 ENCOUNTER — Ambulatory Visit: Payer: Medicare Other

## 2015-02-26 ENCOUNTER — Ambulatory Visit (INDEPENDENT_AMBULATORY_CARE_PROVIDER_SITE_OTHER): Payer: Medicare Other | Admitting: Licensed Clinical Social Worker

## 2015-02-26 DIAGNOSIS — F3160 Bipolar disorder, current episode mixed, unspecified: Secondary | ICD-10-CM | POA: Diagnosis not present

## 2015-02-26 DIAGNOSIS — F3130 Bipolar disorder, current episode depressed, mild or moderate severity, unspecified: Secondary | ICD-10-CM | POA: Diagnosis not present

## 2015-02-26 DIAGNOSIS — F4001 Agoraphobia with panic disorder: Secondary | ICD-10-CM

## 2015-02-26 NOTE — Progress Notes (Signed)
THERAPIST PROGRESS NOTE  Session Time: 3:12 p.m. - 4:15 p.m.  Participation Level: Active  Behavioral Response: Casual and NeatAlertAnxious and Depressed  Type of Therapy: Individual Therapy  Treatment Goals addressed: Anxiety and Coping  Interventions: CBT, Strength-based, Supportive and Reframing  Summary: Tiffany Mathews is a 54 y.o. female who returns to OPT to address ongoing psychosocial stressors, coping and illness management. Tiffany Mathews started school at [employer] and recently had to drop a class because of car problems and she was sick.  Now she is in class Tuesday 12:30 p.m.- 3:00 p.m. & Thursdays and Fridays 11:30 a.m.- 2:40 p.m. and is taking graphic arts.  She had to cancel her Monday night class because her daughter was texting her too much and was anxious with Darriana being gone a few hours from the home.  She had a conversation with her daughter about this and will try an evening class in the Spring 2017.    Her daughter is now on a 65B plan and other than some occasional clinging, Tiffany Mathews feels that her daughter is doing well and symptoms are managed on current medications and daughter continues to participate in OPT.  The living arrangements are the same and she and ex-spouse maintain distance yet are civil and able to communicate free from conflict regarding daughter's needs.  Tiffany Mathews and daughter cook meals together and seem to spend quality time together doing other activities as well.  Client will follow up with Dr. Nicolasa Ducking next week and will discuss an increase in her anti-depressant to be pro-active in managing her depression with pending change of seasons. "I know my triggers and I don't want to be a Debbie downer. I want to put up a Christmas tree."  She does not feel that her mood is as severe as it was this time last year and although things like hearing Christmas music or other seasonal songs and advertisements are difficult at times, these do not negatively impact her mood  to the severity that these did last year at this time.  Some concern voiced about sliding backwards in her mood and seeing herself more in a slump as evidenced by not exercising.  Mood is not interfering with her attendance and classroom participation per client and she is managing her anxiety by "I just push through it."  Tiffany Mathews remains goal focused and with good insight regarding triggers that can lead to relapse of her Bi-Polar, especially her depression and she remains pro-active in regards to her medication management and therapy needs.  "It is helpful to come here because you give me insight."  She voiced a positive experience with the therapeutic process and assured LCSW that she feels confident that she has more skills to cope with her anxiety and depression.  Maintaining regular contacts with family in [place] is a resource for [employer] and now being in school has reinforced for her that she can do more than she gave herself credit for in the past.  She talked about how she is enjoying the socialization even being one of the older students and feels less pressure on how her illness will be perceived by others since getting to know other students in her classes who are also in treatment or on medications.  No new concerns or needs voiced and overall Tiffany Mathews remains stable and is staying involved with meaningful activities and enjoying time with her daughter that is uninterrupted by her ex-spouse.  Suicidal/Homicidal: Negativewithout intent/plan  Therapist Response:   LCSW reinforced importance of identification  of irrational fears and beliefs that contribute to anxiety and panic and praised her for attending school and recognizing that she can push through her anxiety and not experience panics. LCSW provided client with supportive therapy with insight along with emotional and social support to reinforce client's strengths, progress made and available resources. Reassured that she and LCSW could continue  to assess the effectiveness of current strategies being used and work on additional ones as deemed necessary.  Assessed overall functioning and relapse risks.  Commended her for staying pro-active and involved in self-care and follow up with treatment recommendations.  Plan: Return again in 3-4 weeks.  [name] is aware that she can contact LCSW sooner PRN.  She will keep all appointments and take medications as prescribed.  Diagnosis: Bi-Polar Disorder, MRE, Depressed, Moderate   Panic Disorder with Agoraphobia  Miguel Dibble, LCSW 02/26/2015

## 2015-03-05 DIAGNOSIS — F3131 Bipolar disorder, current episode depressed, mild: Secondary | ICD-10-CM | POA: Diagnosis not present

## 2015-03-26 ENCOUNTER — Ambulatory Visit (INDEPENDENT_AMBULATORY_CARE_PROVIDER_SITE_OTHER): Payer: Medicare Other | Admitting: Licensed Clinical Social Worker

## 2015-03-26 DIAGNOSIS — F3130 Bipolar disorder, current episode depressed, mild or moderate severity, unspecified: Secondary | ICD-10-CM | POA: Diagnosis not present

## 2015-03-26 DIAGNOSIS — F4001 Agoraphobia with panic disorder: Secondary | ICD-10-CM

## 2015-03-26 NOTE — Progress Notes (Signed)
THERAPIST PROGRESS NOTE  Session Time: 11:10 a.m. - 12:30 p.m.  Participation Level: Active  Behavioral Response: CasualAlertAnxious, Depressed and Euthymic  Type of Therapy: Individual Therapy  Treatment Goals addressed: Anxiety and Coping  Interventions: Solution Focused, Strength-based, Supportive and Reframing  Summary: Tiffany Mathews is a 54 y.o. female who presents for routine OPT follow up session.  "I'm taking one day at a time."  Updates on her daughter were provided. Tiffany Mathews continues attending [employer] classes Tuesdays, Thursdays & Fridays.  "It's helpful to be there. "  She is enjoying the younger students and positive about her interactions with them and her professors.  Observing their creativity and exploring her own has been exciting per client. "I did it. I'm finally in school."  It is important to Tiffany Mathews to role model for her daughter how to live responsibly and with structure especially in light of her daughter's diagnosis of Schizophrenia.    Client provided LCSW with an update about her daughter who is doing well socially and academically and who was recently awarded a certificate for her art. Some sadness around how disconnected from emotions her daughter is yet client is pleased and proud of daughter's talents and opportunities to expand her love of animation and cameras/computers to do this.  No problems indicated with the ex-spouse and Tiffany Mathews with plans to have the two of them finalize some legal documents to protect their daughter as well as preserve client's maternal rights.  She expressed more confidence in asserting herself with him.  There is reported concern about her depression symptoms returning.  "I feel like I was a little manic in the Summer and had all of these plans and was staying busy. I feel body aches all over and I feel like I'm grieving and I don't know why. I feel like I'm bouncing back into this." She described self as feeling slowed down and forcing  self to get things done like simple things.  "I don't want it to be this way."  Client will follow up with Dr. Nicolasa Ducking next week and will discuss an increase in her anti-depressant to be pro-active in managing her depression with pending change of seasons.  Nikol talked about her writings "Unsent letters" is what she called and reflected on how "far out" she must have been emotionally and behaviorally. Through reading these again recently, she has been able to identify  "I've really made progress." Denied that her mood is not interfering with her attendance and classroom participation per client and she is managing her anxiety by "I just push through it."  Tiffany Mathews remains goal focused and with good insight regarding triggers that can lead to relapse of her Bi-Polar, especially her depression and she remains pro-active in regards to her medication management and therapy needs.  "It is helpful to come here because you give me insight."  She voiced a positive experience with the therapeutic process and assured LCSW that she feels confident that she has more skills to cope with her anxiety and depression.  No new concerns or needs voiced and overall Tiffany Mathews remains stable and is staying involved with meaningful activities and enjoying time with her daughter.  Client expressed disappointment in news about LCSW leaving this practice and accepted notice in writing with this information as well as information about her options to work with other OPT in the community or in this practice.  Tiffany Mathews expressed understanding that there could be a delay in services if she continues in OPT with this  LCSW yet her preference is to follow this LCSW to the new practice and continue OPT.  She provided LCSW her contact information/PHI, including insurance to coordinate service provision.  Suicidal/Homicidal: Negativewithout intent/plan  Therapist Response:   LCSW reinforced client's progress and increased use of effective  coping strategies.  Praised her efforts and celebrated her success in terms of getting into college.   LCSW provided client with supportive therapy with insight along with emotional and social support to reinforce client's strengths, progress made and available resources. Reassured that she and LCSW could continue to assess the effectiveness of current strategies being used and work on additional ones as deemed necessary.  Assessed overall functioning and relapse risks.  Commended her for staying pro-active and involved in self-care and follow up with treatment recommendations.  LCSW notified client that therapist resigned current position with this practice and last day to be 04/11/15.  Provided client with list of other providers and also included LCSW as an option if she would like to continue with LCSW at another practice pending insurance contract being secured.  Reassured client that she could also follow up in this clinic with Royal Piedra, LCSW PRN.    Plan: Terminated services with client at this location/practice.  Client sees Dr. Nicolasa Ducking next week for medication management and she will resume outpatient therapy with LCSW at LCSW's new office location.  [name] will contact LCSW at this practice until 04/11/15, and afterwards will contact her psychiatrist for any crisis until services can resume with LCSW.  Diagnosis: Bi-Polar Disorder, MRE, Depressed, Moderate   Panic Disorder with Agoraphobia  Tiffany Dibble, LCSW 03/26/2015

## 2015-03-27 ENCOUNTER — Other Ambulatory Visit: Payer: Self-pay | Admitting: Internal Medicine

## 2015-03-27 DIAGNOSIS — M7550 Bursitis of unspecified shoulder: Secondary | ICD-10-CM | POA: Diagnosis not present

## 2015-03-27 DIAGNOSIS — I10 Essential (primary) hypertension: Secondary | ICD-10-CM | POA: Diagnosis not present

## 2015-03-27 DIAGNOSIS — Z23 Encounter for immunization: Secondary | ICD-10-CM | POA: Diagnosis not present

## 2015-03-27 DIAGNOSIS — Z1231 Encounter for screening mammogram for malignant neoplasm of breast: Secondary | ICD-10-CM

## 2015-03-27 DIAGNOSIS — G5600 Carpal tunnel syndrome, unspecified upper limb: Secondary | ICD-10-CM | POA: Diagnosis not present

## 2015-04-03 DIAGNOSIS — F3131 Bipolar disorder, current episode depressed, mild: Secondary | ICD-10-CM | POA: Diagnosis not present

## 2015-04-09 DIAGNOSIS — A630 Anogenital (venereal) warts: Secondary | ICD-10-CM | POA: Diagnosis not present

## 2015-04-09 DIAGNOSIS — R102 Pelvic and perineal pain: Secondary | ICD-10-CM | POA: Diagnosis not present

## 2015-04-10 ENCOUNTER — Other Ambulatory Visit: Payer: Self-pay | Admitting: Internal Medicine

## 2015-04-10 ENCOUNTER — Ambulatory Visit
Admission: RE | Admit: 2015-04-10 | Discharge: 2015-04-10 | Disposition: A | Discharge status: Home / Self Care | Payer: Medicare Other | Source: Ambulatory Visit | Attending: Internal Medicine | Admitting: Internal Medicine

## 2015-04-10 DIAGNOSIS — Z1231 Encounter for screening mammogram for malignant neoplasm of breast: Secondary | ICD-10-CM

## 2015-04-23 DIAGNOSIS — Z1211 Encounter for screening for malignant neoplasm of colon: Secondary | ICD-10-CM | POA: Diagnosis not present

## 2015-04-23 DIAGNOSIS — R102 Pelvic and perineal pain: Secondary | ICD-10-CM | POA: Diagnosis not present

## 2015-04-23 DIAGNOSIS — Z124 Encounter for screening for malignant neoplasm of cervix: Secondary | ICD-10-CM | POA: Diagnosis not present

## 2015-04-23 DIAGNOSIS — Z01419 Encounter for gynecological examination (general) (routine) without abnormal findings: Secondary | ICD-10-CM | POA: Diagnosis not present

## 2015-05-21 DIAGNOSIS — F3132 Bipolar disorder, current episode depressed, moderate: Secondary | ICD-10-CM | POA: Diagnosis not present

## 2015-05-21 DIAGNOSIS — F4001 Agoraphobia with panic disorder: Secondary | ICD-10-CM | POA: Diagnosis not present

## 2015-05-28 DIAGNOSIS — F3131 Bipolar disorder, current episode depressed, mild: Secondary | ICD-10-CM | POA: Diagnosis not present

## 2015-06-23 DIAGNOSIS — F3132 Bipolar disorder, current episode depressed, moderate: Secondary | ICD-10-CM | POA: Diagnosis not present

## 2015-06-23 DIAGNOSIS — F4001 Agoraphobia with panic disorder: Secondary | ICD-10-CM | POA: Diagnosis not present

## 2015-07-01 DIAGNOSIS — F3131 Bipolar disorder, current episode depressed, mild: Secondary | ICD-10-CM | POA: Diagnosis not present

## 2015-07-10 ENCOUNTER — Encounter: Payer: Self-pay | Admitting: Emergency Medicine

## 2015-07-10 ENCOUNTER — Emergency Department: Payer: Medicare Other

## 2015-07-10 ENCOUNTER — Emergency Department
Admission: EM | Admit: 2015-07-10 | Discharge: 2015-07-10 | Disposition: A | Discharge status: Home / Self Care | Payer: Medicare Other | Attending: Emergency Medicine | Admitting: Emergency Medicine

## 2015-07-10 DIAGNOSIS — I10 Essential (primary) hypertension: Secondary | ICD-10-CM | POA: Insufficient documentation

## 2015-07-10 DIAGNOSIS — R51 Headache: Secondary | ICD-10-CM | POA: Insufficient documentation

## 2015-07-10 DIAGNOSIS — R079 Chest pain, unspecified: Secondary | ICD-10-CM | POA: Insufficient documentation

## 2015-07-10 DIAGNOSIS — R1013 Epigastric pain: Secondary | ICD-10-CM | POA: Insufficient documentation

## 2015-07-10 DIAGNOSIS — R109 Unspecified abdominal pain: Secondary | ICD-10-CM | POA: Diagnosis not present

## 2015-07-10 DIAGNOSIS — R0602 Shortness of breath: Secondary | ICD-10-CM | POA: Diagnosis not present

## 2015-07-10 HISTORY — DX: Bipolar disorder, unspecified: F31.9

## 2015-07-10 HISTORY — DX: Essential (primary) hypertension: I10

## 2015-07-10 LAB — CBC
HCT: 41.5 % (ref 35.0–47.0)
Hemoglobin: 14 g/dL (ref 12.0–16.0)
MCH: 31.7 pg (ref 26.0–34.0)
MCHC: 33.7 g/dL (ref 32.0–36.0)
MCV: 94 fL (ref 80.0–100.0)
PLATELETS: 351 10*3/uL (ref 150–440)
RBC: 4.41 MIL/uL (ref 3.80–5.20)
RDW: 13.3 % (ref 11.5–14.5)
WBC: 8.5 10*3/uL (ref 3.6–11.0)

## 2015-07-10 LAB — BASIC METABOLIC PANEL
Anion gap: 5 (ref 5–15)
BUN: 14 mg/dL (ref 6–20)
CALCIUM: 8.8 mg/dL — AB (ref 8.9–10.3)
CO2: 26 mmol/L (ref 22–32)
CREATININE: 0.83 mg/dL (ref 0.44–1.00)
Chloride: 110 mmol/L (ref 101–111)
GFR calc non Af Amer: >60 mL/min (ref >60)
GLUCOSE: 123 mg/dL — AB (ref 65–99)
Potassium: 3.7 mmol/L (ref 3.5–5.1)
Sodium: 141 mmol/L (ref 135–145)

## 2015-07-10 LAB — TROPONIN I: Troponin I: <0.03 ng/mL (ref <0.031)

## 2015-07-10 LAB — LIPASE, BLOOD: Lipase: 29 U/L (ref 11–51)

## 2015-07-10 MED ORDER — OXYCODONE-ACETAMINOPHEN 5-325 MG PO TABS
ORAL_TABLET | ORAL | Status: DC
Start: 2015-07-10 — End: 2015-07-10
  Filled 2015-07-10: qty 2

## 2015-07-10 MED ORDER — OXYCODONE-ACETAMINOPHEN 5-325 MG PO TABS
2.0000 | ORAL_TABLET | Freq: Once | ORAL | Status: AC
  Administered 2015-07-10: 2 via ORAL

## 2015-07-10 NOTE — Discharge Instructions (Signed)

## 2015-07-10 NOTE — ED Notes (Signed)
Pt reports left sided chest pain since last night, radiates into back and stabbing pain. Pt denies shortness of breath, nausea or vomiting. Pt reports dizziness and "severe" headache.

## 2015-07-10 NOTE — ED Notes (Signed)
Developed center and left sided chest pain last pm  Describes pain as stabbing

## 2015-07-10 NOTE — ED Provider Notes (Signed)
[facility] Emergency Department Provider Note     Time seen: ----------------------------------------- 6:06 PM on 07/10/2015 -----------------------------------------    I have reviewed the triage vital signs and the nursing notes.   HISTORY  Chief Complaint Chest Pain    HPI Tiffany Mathews is a 55 y.o. female who presents ER for left-sided chest pain and epigastric pain that radiates into her back. Patient describes a stabbing pain, denies fevers, chills, shortness of breath, nausea vomiting or diarrhea. Patient does report severe headache. Patient denies a history of this before. Nothing makes it better or worse.   Past Medical History  Diagnosis Date  . Hypertension   . Bipolar 1 disorder [facility])     Patient Active Problem List   Diagnosis Date Noted  . Panic disorder with agoraphobia and moderate panic attacks 12/12/2014  . Bipolar I disorder, most recent episode mixed ([place]) 12/12/2014    Past Surgical History  Procedure Laterality Date  . Breast biopsy Right 2009    -  . Cesarean section      Allergies Depakote; Lithium; and Lamictal  Social History Social History  Substance Use Topics  . Smoking status: Never Smoker   . Smokeless tobacco: None  . Alcohol Use: No    Review of Systems Constitutional: Negative for fever. Eyes: Negative for visual changes. ENT: Negative for sore throat. Cardiovascular: Positive for chest pain Respiratory: Negative for shortness of breath. Gastrointestinal: Positive for abdominal pain Genitourinary: Negative for dysuria. Musculoskeletal: Negative for back pain. Skin: Negative for rash. Neurological: Negative for headaches, focal weakness or numbness.  10-point ROS otherwise negative.  ____________________________________________   PHYSICAL EXAM:  VITAL SIGNS: ED Triage Vitals  Enc Vitals Group     BP --      Pulse --      Resp --      Temp --      Temp src --      SpO2 --    Weight --      Height --      Head Cir --      Peak Flow --      Pain Score 07/10/15 1708 10     Pain Loc --      Pain Edu? --      Excl. in [place]? --     Constitutional: Alert and oriented. Well appearing and in no distress. Eyes: Conjunctivae are normal. PERRL. Normal extraocular movements. ENT   Head: Normocephalic and atraumatic.   Nose: No congestion/rhinnorhea.   Mouth/Throat: Mucous membranes are moist.   Neck: No stridor. Cardiovascular: Normal rate, regular rhythm. Normal and symmetric distal pulses are present in all extremities. No murmurs, rubs, or gallops. Respiratory: Normal respiratory effort without tachypnea nor retractions. Breath sounds are clear and equal bilaterally. No wheezes/rales/rhonchi. Gastrointestinal: Epigastric tenderness, no rebound or guarding. Normal bowel sounds. Musculoskeletal: Nontender with normal range of motion in all extremities. No joint effusions.  No lower extremity tenderness nor edema. Neurologic:  Normal speech and language. No gross focal neurologic deficits are appreciated. Speech is normal. No gait instability. Skin:  Skin is warm, dry and intact. No rash noted. Psychiatric: Mood and affect are normal. Speech and behavior are normal. Patient exhibits appropriate insight and judgment. ____________________________________________  EKG: Interpreted by me. Normal sinus rhythm with normal axis normal intervals, no evidence of hypertrophy or acute infarction. Rate is 86 bpm  ____________________________________________  ED COURSE:  Pertinent labs & imaging results that were available during my care of the  patient were reviewed by me and considered in my medical decision making (see chart for details). Patient is in no acute distress, will check cardiac and abdominal labs. ____________________________________________    LABS (pertinent positives/negatives)  Labs Reviewed  BASIC METABOLIC PANEL - Abnormal; Notable for the  following:    Glucose, Bld 123 (*)    Calcium 8.8 (*)    All other components within normal limits  TROPONIN I  CBC  LIPASE, BLOOD    RADIOLOGY Images were viewed by me  Acute abdominal series Is unremarkable ____________________________________________  FINAL ASSESSMENT AND PLAN  Abdominal pain  Plan: Patient with labs and imaging as dictated above. Patient's labs are unremarkable, x-rays as well. She is stable for outpatient follow-up with her doctor. No clear etiology for symptoms.   Earleen Newport, MD   Earleen Newport, MD 07/10/15 2004

## 2015-07-15 DIAGNOSIS — F3132 Bipolar disorder, current episode depressed, moderate: Secondary | ICD-10-CM | POA: Diagnosis not present

## 2015-07-15 DIAGNOSIS — F4001 Agoraphobia with panic disorder: Secondary | ICD-10-CM | POA: Diagnosis not present

## 2015-07-17 DIAGNOSIS — F3131 Bipolar disorder, current episode depressed, mild: Secondary | ICD-10-CM | POA: Diagnosis not present

## 2015-07-18 DIAGNOSIS — K2900 Acute gastritis without bleeding: Secondary | ICD-10-CM | POA: Diagnosis not present

## 2015-07-18 DIAGNOSIS — I10 Essential (primary) hypertension: Secondary | ICD-10-CM | POA: Diagnosis not present

## 2015-07-21 DIAGNOSIS — R109 Unspecified abdominal pain: Secondary | ICD-10-CM | POA: Diagnosis not present

## 2015-07-21 DIAGNOSIS — R110 Nausea: Secondary | ICD-10-CM | POA: Diagnosis not present

## 2015-08-05 DIAGNOSIS — F4001 Agoraphobia with panic disorder: Secondary | ICD-10-CM | POA: Diagnosis not present

## 2015-08-05 DIAGNOSIS — F3132 Bipolar disorder, current episode depressed, moderate: Secondary | ICD-10-CM | POA: Diagnosis not present

## 2015-08-25 ENCOUNTER — Ambulatory Visit: Payer: Self-pay | Admitting: Gastroenterology

## 2015-08-28 DIAGNOSIS — F3131 Bipolar disorder, current episode depressed, mild: Secondary | ICD-10-CM | POA: Diagnosis not present

## 2015-08-29 DIAGNOSIS — F3131 Bipolar disorder, current episode depressed, mild: Secondary | ICD-10-CM | POA: Diagnosis not present

## 2015-08-29 DIAGNOSIS — Z79899 Other long term (current) drug therapy: Secondary | ICD-10-CM | POA: Diagnosis not present

## 2015-09-02 DIAGNOSIS — F4001 Agoraphobia with panic disorder: Secondary | ICD-10-CM | POA: Diagnosis not present

## 2015-09-02 DIAGNOSIS — F3132 Bipolar disorder, current episode depressed, moderate: Secondary | ICD-10-CM | POA: Diagnosis not present

## 2015-09-24 DIAGNOSIS — F3132 Bipolar disorder, current episode depressed, moderate: Secondary | ICD-10-CM | POA: Diagnosis not present

## 2015-09-24 DIAGNOSIS — F4001 Agoraphobia with panic disorder: Secondary | ICD-10-CM | POA: Diagnosis not present

## 2015-10-13 DIAGNOSIS — F3131 Bipolar disorder, current episode depressed, mild: Secondary | ICD-10-CM | POA: Diagnosis not present

## 2015-10-15 DIAGNOSIS — F3132 Bipolar disorder, current episode depressed, moderate: Secondary | ICD-10-CM | POA: Diagnosis not present

## 2015-10-15 DIAGNOSIS — F4001 Agoraphobia with panic disorder: Secondary | ICD-10-CM | POA: Diagnosis not present

## 2015-10-31 DIAGNOSIS — G4733 Obstructive sleep apnea (adult) (pediatric): Secondary | ICD-10-CM | POA: Diagnosis not present

## 2015-10-31 DIAGNOSIS — E669 Obesity, unspecified: Secondary | ICD-10-CM | POA: Diagnosis not present

## 2015-11-05 DIAGNOSIS — F4001 Agoraphobia with panic disorder: Secondary | ICD-10-CM | POA: Diagnosis not present

## 2015-11-05 DIAGNOSIS — F3132 Bipolar disorder, current episode depressed, moderate: Secondary | ICD-10-CM | POA: Diagnosis not present

## 2015-11-14 DIAGNOSIS — F3131 Bipolar disorder, current episode depressed, mild: Secondary | ICD-10-CM | POA: Diagnosis not present

## 2015-11-26 DIAGNOSIS — F4001 Agoraphobia with panic disorder: Secondary | ICD-10-CM | POA: Diagnosis not present

## 2015-11-26 DIAGNOSIS — F3132 Bipolar disorder, current episode depressed, moderate: Secondary | ICD-10-CM | POA: Diagnosis not present

## 2015-11-28 DIAGNOSIS — F3131 Bipolar disorder, current episode depressed, mild: Secondary | ICD-10-CM | POA: Diagnosis not present

## 2015-12-04 DIAGNOSIS — M7550 Bursitis of unspecified shoulder: Secondary | ICD-10-CM | POA: Diagnosis not present

## 2015-12-04 DIAGNOSIS — J45991 Cough variant asthma: Secondary | ICD-10-CM | POA: Diagnosis not present

## 2015-12-04 DIAGNOSIS — M797 Fibromyalgia: Secondary | ICD-10-CM | POA: Diagnosis not present

## 2015-12-04 DIAGNOSIS — E784 Other hyperlipidemia: Secondary | ICD-10-CM | POA: Diagnosis not present

## 2015-12-05 DIAGNOSIS — R5381 Other malaise: Secondary | ICD-10-CM | POA: Diagnosis not present

## 2015-12-05 DIAGNOSIS — R110 Nausea: Secondary | ICD-10-CM | POA: Diagnosis not present

## 2015-12-17 DIAGNOSIS — F4001 Agoraphobia with panic disorder: Secondary | ICD-10-CM | POA: Diagnosis not present

## 2015-12-17 DIAGNOSIS — F3132 Bipolar disorder, current episode depressed, moderate: Secondary | ICD-10-CM | POA: Diagnosis not present

## 2015-12-25 ENCOUNTER — Other Ambulatory Visit: Payer: Self-pay | Admitting: Gastroenterology

## 2015-12-25 DIAGNOSIS — M2550 Pain in unspecified joint: Secondary | ICD-10-CM | POA: Insufficient documentation

## 2015-12-25 DIAGNOSIS — R946 Abnormal results of thyroid function studies: Secondary | ICD-10-CM | POA: Diagnosis not present

## 2015-12-25 DIAGNOSIS — R21 Rash and other nonspecific skin eruption: Secondary | ICD-10-CM | POA: Diagnosis not present

## 2015-12-25 DIAGNOSIS — M791 Myalgia, unspecified site: Secondary | ICD-10-CM | POA: Insufficient documentation

## 2015-12-25 DIAGNOSIS — Q382 Macroglossia: Secondary | ICD-10-CM | POA: Diagnosis not present

## 2015-12-25 DIAGNOSIS — R918 Other nonspecific abnormal finding of lung field: Secondary | ICD-10-CM | POA: Diagnosis not present

## 2015-12-25 HISTORY — DX: Pain in unspecified joint: M25.50

## 2015-12-25 HISTORY — DX: Macroglossia: Q38.2

## 2016-01-20 ENCOUNTER — Telehealth: Payer: Self-pay | Admitting: Gastroenterology

## 2016-01-20 DIAGNOSIS — F3131 Bipolar disorder, current episode depressed, mild: Secondary | ICD-10-CM | POA: Diagnosis not present

## 2016-01-20 DIAGNOSIS — F900 Attention-deficit hyperactivity disorder, predominantly inattentive type: Secondary | ICD-10-CM | POA: Diagnosis not present

## 2016-01-20 NOTE — Telephone Encounter (Signed)
Pt stated the epigastric pain is getting painful. Can we give her some samples of Dexilant to try before her appt in mid September?

## 2016-01-20 NOTE — Telephone Encounter (Signed)
This is a patient of Dr Allen Norris last seen in 2015. She has continued to take Pantoprazole, however she is experiencing epigastric pain again and feels like the Pantoprazole is no longer working . I have made an appointment for her to see Dr Allen Norris on Monday September 18th. She would like to speak with you about possibly changing the medication for epigastric pain, before she sees Dr Allen Norris  In September. Please call and advise.

## 2016-01-24 ENCOUNTER — Other Ambulatory Visit: Payer: Self-pay | Admitting: Gastroenterology

## 2016-01-24 NOTE — Telephone Encounter (Signed)
Yes. Please give her samples.

## 2016-01-28 NOTE — Telephone Encounter (Signed)
Patient is returning your call.  

## 2016-01-28 NOTE — Telephone Encounter (Signed)
Tried contacting pt but vm box was full. Couldn't leave message.

## 2016-01-29 NOTE — Telephone Encounter (Signed)
Contacted pt to let her know Dr. Allen Norris was ok with her having samples of Dexilant to try. She stated she wanted to stay with the Pantoprazole for now and would like to add an additional PPI for any breakthrough reflux. Advised her she needs to make sure she has at least 8 hours between the two medications. Pt has a follow up with Dr. Allen Norris and I have advised her due to our schedule running into October, she really needs to keep this appt.

## 2016-02-12 DIAGNOSIS — F900 Attention-deficit hyperactivity disorder, predominantly inattentive type: Secondary | ICD-10-CM | POA: Diagnosis not present

## 2016-02-12 DIAGNOSIS — F3131 Bipolar disorder, current episode depressed, mild: Secondary | ICD-10-CM | POA: Diagnosis not present

## 2016-02-18 DIAGNOSIS — F3132 Bipolar disorder, current episode depressed, moderate: Secondary | ICD-10-CM | POA: Diagnosis not present

## 2016-02-18 DIAGNOSIS — F4001 Agoraphobia with panic disorder: Secondary | ICD-10-CM | POA: Diagnosis not present

## 2016-02-23 ENCOUNTER — Other Ambulatory Visit: Payer: Self-pay

## 2016-02-23 ENCOUNTER — Ambulatory Visit: Payer: Medicare Other | Admitting: Gastroenterology

## 2016-02-23 ENCOUNTER — Ambulatory Visit (INDEPENDENT_AMBULATORY_CARE_PROVIDER_SITE_OTHER): Payer: Medicare Other | Admitting: Gastroenterology

## 2016-02-23 ENCOUNTER — Encounter: Payer: Self-pay | Admitting: Gastroenterology

## 2016-02-23 VITALS — BP 149/74 | HR 86 | Temp 98.5°F | Ht 65.0 in | Wt 192.0 lb

## 2016-02-23 DIAGNOSIS — F32A Depression, unspecified: Secondary | ICD-10-CM | POA: Insufficient documentation

## 2016-02-23 DIAGNOSIS — K449 Diaphragmatic hernia without obstruction or gangrene: Secondary | ICD-10-CM | POA: Insufficient documentation

## 2016-02-23 DIAGNOSIS — G8929 Other chronic pain: Secondary | ICD-10-CM | POA: Diagnosis not present

## 2016-02-23 DIAGNOSIS — F329 Major depressive disorder, single episode, unspecified: Secondary | ICD-10-CM | POA: Insufficient documentation

## 2016-02-23 DIAGNOSIS — R1013 Epigastric pain: Secondary | ICD-10-CM | POA: Diagnosis not present

## 2016-02-23 DIAGNOSIS — IMO0002 Reserved for concepts with insufficient information to code with codable children: Secondary | ICD-10-CM | POA: Insufficient documentation

## 2016-02-23 DIAGNOSIS — E041 Nontoxic single thyroid nodule: Secondary | ICD-10-CM | POA: Insufficient documentation

## 2016-02-23 DIAGNOSIS — M1990 Unspecified osteoarthritis, unspecified site: Secondary | ICD-10-CM | POA: Insufficient documentation

## 2016-02-23 DIAGNOSIS — F319 Bipolar disorder, unspecified: Secondary | ICD-10-CM | POA: Insufficient documentation

## 2016-02-23 DIAGNOSIS — N209 Urinary calculus, unspecified: Secondary | ICD-10-CM | POA: Insufficient documentation

## 2016-02-23 DIAGNOSIS — F341 Dysthymic disorder: Secondary | ICD-10-CM | POA: Insufficient documentation

## 2016-02-23 DIAGNOSIS — R102 Pelvic and perineal pain: Secondary | ICD-10-CM | POA: Insufficient documentation

## 2016-02-23 DIAGNOSIS — K5792 Diverticulitis of intestine, part unspecified, without perforation or abscess without bleeding: Secondary | ICD-10-CM | POA: Insufficient documentation

## 2016-02-23 HISTORY — DX: Diaphragmatic hernia without obstruction or gangrene: K44.9

## 2016-02-23 NOTE — Progress Notes (Signed)
Primary Care Physician: Cletis Athens, MD  Primary Gastroenterologist:  Dr. Lucilla Lame  Chief Complaint  Patient presents with  . Abdominal Pain    HPI: Tiffany Mathews is a 55 y.o. female here for abdominalpain.  The patient reports that her abdominal pain is in the right upper quadrant and epigastric area.  She also states he goes down to the right middle quadrant. The patient had an EGD and colonoscopy in the past approximately one year ago at that time the patient was found to have an active gastritis. The patient now reports that she is taking her Protonix and Zantac daily.  She states that she still has some indigestion in the evening.  Current Outpatient Prescriptions  Medication Sig Dispense Refill  . amphetamine-dextroamphetamine (ADDERALL) 5 MG tablet     . aspirin EC 81 MG tablet Take by mouth.    Marland Kitchen azelastine (ASTELIN) 0.1 % nasal spray Place 1 spray into both nostrils 2 (two) times daily. Use in each nostril as directed    . clonazePAM (KLONOPIN) 0.5 MG tablet Take 0.5 mg by mouth 2 (two) times daily as needed for anxiety.    . cyclobenzaprine (FLEXERIL) 10 MG tablet Take 10 mg by mouth 3 (three) times daily as needed for muscle spasms.    . fluticasone (FLONASE) 50 MCG/ACT nasal spray     . gabapentin (NEURONTIN) 100 MG capsule     . losartan (COZAAR) 100 MG tablet Take by mouth.    . pantoprazole (PROTONIX) 40 MG tablet Take 1 tablet (40 mg total) by mouth daily. 30 tablet 6  . QUEtiapine (SEROQUEL) 25 MG tablet     . ranitidine (ZANTAC) 150 MG tablet Take 150 mg by mouth 2 (two) times daily.    . sertraline (ZOLOFT) 100 MG tablet Take 100 mg by mouth daily.    . sucralfate (CARAFATE) 1 g tablet     . atorvastatin (LIPITOR) 40 MG tablet Take by mouth.     No current facility-administered medications for this visit.     Allergies as of 02/23/2016 - Review Complete 02/23/2016  Allergen Reaction Noted  . Depakote [divalproex sodium] Other (See Comments) 03/20/2014  .  Lithium Other (See Comments) 03/20/2014  . Lamictal [lamotrigine] Rash and Other (See Comments) 03/20/2014    ROS:  General: Negative for anorexia, weight loss, fever, chills, fatigue, weakness. ENT: Negative for hoarseness, difficulty swallowing , nasal congestion. CV: Negative for chest pain, angina, palpitations, dyspnea on exertion, peripheral edema.  Respiratory: Negative for dyspnea at rest, dyspnea on exertion, cough, sputum, wheezing.  GI: See history of present illness. GU:  Negative for dysuria, hematuria, urinary incontinence, urinary frequency, nocturnal urination.  Endo: Negative for unusual weight change.    Physical Examination:   BP (!) 149/74   Pulse 86   Temp 98.5 F (36.9 C) (Oral)   Ht 5\' 5"  (1.651 m)   Wt 192 lb (87.1 kg)   BMI 31.95 kg/m   General: Well-nourished, well-developed in no acute distress.  Eyes: No icterus. Conjunctivae pink. Mouth: Oropharyngeal mucosa moist and pink , no lesions erythema or exudate. Lungs: Clear to auscultation bilaterally. Non-labored. Heart: Regular rate and rhythm, no murmurs rubs or gallops.  Abdomen: Bowel sounds are normal, Positive tenderness to one finger palpation while flexing abdominal wall muscles, nondistended, no hepatosplenomegaly or masses, no abdominal bruits or hernia , no rebound or guarding.   Extremities: No lower extremity edema. No clubbing or deformities. Neuro: Alert and oriented x 3.  Grossly intact.  Skin: Warm and dry, no jaundice.   Psych: Alert and cooperative, normal mood and affect.  Labs:    Imaging Studies: No results found.  Assessment and Plan:   Tiffany Mathews is a 55 y.o. y/o female who comes in today with epigastric and right-sided abdominal pain which is reproducible by raising the patient's legs 6 inches off the exam table and palpating with one finger. The patient's dyspepsia at night will be treated with switching to Dexilant  in the evening so that her blood concentration is  maximum in the evening.  The patient has also been told to use warm compresses to the abdominal wall muscles.  She has also been told that if her dyspepsia does not get better she may need a right upper quadrant ultrasound with a HIDA scan.  He should has been explained the plan and agrees with it.  Note: This dictation was prepared with Dragon dictation along with smaller phrase technology. Any transcriptional errors that result from this process are unintentional.

## 2016-03-01 DIAGNOSIS — E010 Iodine-deficiency related diffuse (endemic) goiter: Secondary | ICD-10-CM | POA: Diagnosis not present

## 2016-03-10 DIAGNOSIS — F3132 Bipolar disorder, current episode depressed, moderate: Secondary | ICD-10-CM | POA: Diagnosis not present

## 2016-03-10 DIAGNOSIS — F4001 Agoraphobia with panic disorder: Secondary | ICD-10-CM | POA: Diagnosis not present

## 2016-03-12 ENCOUNTER — Telehealth: Payer: Self-pay | Admitting: Gastroenterology

## 2016-03-12 ENCOUNTER — Other Ambulatory Visit: Payer: Self-pay

## 2016-03-12 DIAGNOSIS — R1084 Generalized abdominal pain: Secondary | ICD-10-CM

## 2016-03-12 NOTE — Telephone Encounter (Signed)
Tried returning pt's call. VM was not set up.

## 2016-03-12 NOTE — Telephone Encounter (Signed)
Pt returned call and advised her per her last office visit with Dr. Allen Norris, if no improvement with Dexilant pt is to be set up for a RUQ [place] with HIDA scan.   Pt has been scheduled for this imaging on Thursday, Oct 26th at [place] at [facility] outpatient [place] location. Pt has been instructed to arrive at 7:45am and to be NPO after midnight. She was also instructed to hold any pain medications the morning of scan. Pt verbalized understanding.

## 2016-03-12 NOTE — Telephone Encounter (Signed)
Doesn't need another RX for Dexilant. Same results as losartin. Does she need a appointement for gallbladder.

## 2016-03-25 DIAGNOSIS — F900 Attention-deficit hyperactivity disorder, predominantly inattentive type: Secondary | ICD-10-CM | POA: Diagnosis not present

## 2016-03-25 DIAGNOSIS — F3131 Bipolar disorder, current episode depressed, mild: Secondary | ICD-10-CM | POA: Diagnosis not present

## 2016-03-25 DIAGNOSIS — F5105 Insomnia due to other mental disorder: Secondary | ICD-10-CM | POA: Diagnosis not present

## 2016-03-30 ENCOUNTER — Telehealth: Payer: Self-pay

## 2016-03-30 DIAGNOSIS — E042 Nontoxic multinodular goiter: Secondary | ICD-10-CM | POA: Diagnosis not present

## 2016-03-30 NOTE — Telephone Encounter (Signed)
Joesphine Bare called [facility]. She needs to know when the patients last menstrual period was. She needs to have not had it for at least two years or have had her tubes tied. If not, she is going to need a urine pregnancy test ordered before her imaging on thursday

## 2016-03-30 NOTE — Telephone Encounter (Signed)
Tried contacting pt but vm was not set up.  

## 2016-03-30 NOTE — Telephone Encounter (Signed)
Pt returned call and stated she had a endometrial ablasion on 04/2011.

## 2016-04-01 ENCOUNTER — Ambulatory Visit
Admission: RE | Admit: 2016-04-01 | Discharge: 2016-04-01 | Disposition: A | Discharge status: Home / Self Care | Payer: Medicare Other | Source: Ambulatory Visit | Attending: Gastroenterology | Admitting: Gastroenterology

## 2016-04-01 ENCOUNTER — Encounter
Admission: RE | Admit: 2016-04-01 | Discharge: 2016-04-01 | Disposition: A | Discharge status: Home / Self Care | Payer: Medicare Other | Source: Ambulatory Visit | Attending: Gastroenterology | Admitting: Gastroenterology

## 2016-04-01 DIAGNOSIS — R1084 Generalized abdominal pain: Secondary | ICD-10-CM | POA: Insufficient documentation

## 2016-04-01 DIAGNOSIS — K828 Other specified diseases of gallbladder: Secondary | ICD-10-CM | POA: Insufficient documentation

## 2016-04-01 DIAGNOSIS — R932 Abnormal findings on diagnostic imaging of liver and biliary tract: Secondary | ICD-10-CM | POA: Insufficient documentation

## 2016-04-01 DIAGNOSIS — R1010 Upper abdominal pain, unspecified: Secondary | ICD-10-CM | POA: Diagnosis not present

## 2016-04-01 DIAGNOSIS — R109 Unspecified abdominal pain: Secondary | ICD-10-CM | POA: Diagnosis not present

## 2016-04-01 MED ORDER — TECHNETIUM TC 99M MEBROFENIN IV KIT
5.0000 | PACK | Freq: Once | INTRAVENOUS | Status: AC | PRN
  Administered 2016-04-01: 4.96 via INTRAVENOUS

## 2016-04-02 ENCOUNTER — Telehealth: Payer: Self-pay

## 2016-04-02 DIAGNOSIS — F3132 Bipolar disorder, current episode depressed, moderate: Secondary | ICD-10-CM | POA: Diagnosis not present

## 2016-04-02 DIAGNOSIS — F4001 Agoraphobia with panic disorder: Secondary | ICD-10-CM | POA: Diagnosis not present

## 2016-04-02 NOTE — Telephone Encounter (Signed)
Tried contacting pt regarding imaging results. No vm was set up.

## 2016-04-02 NOTE — Telephone Encounter (Signed)
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 We should have her set up with a surgeon to discuss possible gallbladder removal.

## 2016-04-02 NOTE — Telephone Encounter (Signed)
Pt notified of imaging results. Pt has been scheduled with Dr. Adonis Huguenin for consultation.

## 2016-04-02 NOTE — Telephone Encounter (Signed)
-----   Message from Lucilla Lame, MD sent at 04/01/2016 10:39 AM EDT ----- Let the patient know that the u/s showed fatty liver but no cause for pain.

## 2016-04-05 ENCOUNTER — Other Ambulatory Visit: Payer: Self-pay

## 2016-04-07 ENCOUNTER — Ambulatory Visit: Payer: Medicare Other | Admitting: General Surgery

## 2016-04-08 ENCOUNTER — Encounter: Payer: Self-pay | Admitting: General Surgery

## 2016-04-08 ENCOUNTER — Ambulatory Visit (INDEPENDENT_AMBULATORY_CARE_PROVIDER_SITE_OTHER): Payer: Medicare Other | Admitting: General Surgery

## 2016-04-08 VITALS — BP 143/81 | HR 100 | Temp 98.3°F | Ht 65.0 in | Wt 194.0 lb

## 2016-04-08 DIAGNOSIS — K828 Other specified diseases of gallbladder: Secondary | ICD-10-CM | POA: Diagnosis not present

## 2016-04-08 HISTORY — DX: Other specified diseases of gallbladder: K82.8

## 2016-04-08 NOTE — Patient Instructions (Signed)
You have requested to have your gallbladder removed. We will arrange for this to be done on 05/27/16 at [facility] with Dr. Adonis Huguenin.  You will most likely be out of work 1-2 weeks for this surgery. You will return after your post-op appointment with a lifting restriction for approximately 4 more weeks.  You will be able to eat anything you would like to following surgery. But, start by eating a bland diet and advance this as tolerated.  Please see the (blue)pre-care form that you have been given today. If you have any questions, please call our office.  Laparoscopic Cholecystectomy Laparoscopic cholecystectomy is surgery to remove the gallbladder. The gallbladder is located in the upper right part of the abdomen, behind the liver. It is a storage sac for bile, which is produced in the liver. Bile aids in the digestion and absorption of fats. Cholecystectomy is often done for inflammation of the gallbladder (cholecystitis). This condition is usually caused by a buildup of gallstones (cholelithiasis) in the gallbladder. Gallstones can block the flow of bile, and that can result in inflammation and pain. In severe cases, emergency surgery may be required. If emergency surgery is not required, you will have time to prepare for the procedure. Laparoscopic surgery is an alternative to open surgery. Laparoscopic surgery has a shorter recovery time. Your common bile duct may also need to be examined during the procedure. If stones are found in the common bile duct, they may be removed. LET [facility] CARE PROVIDER KNOW ABOUT:  Any allergies you have.  All medicines you are taking, including vitamins, herbs, eye drops, creams, and over-the-counter medicines.  Previous problems you or members of your family have had with the use of anesthetics.  Any blood disorders you have.  Previous surgeries you have had.    Any medical conditions you have. RISKS AND COMPLICATIONS Generally, this is a  safe procedure. However, problems may occur, including:  Infection.  Bleeding.  Allergic reactions to medicines.  Damage to other structures or organs.  A stone remaining in the common bile duct.  A bile leak from the cyst duct that is clipped when your gallbladder is removed.  The need to convert to open surgery, which requires a larger incision in the abdomen. This may be necessary if your surgeon thinks that it is not safe to continue with a laparoscopic procedure. BEFORE THE PROCEDURE  Ask your health care provider about:  Changing or stopping your regular medicines. This is especially important if you are taking diabetes medicines or blood thinners.  Taking medicines such as aspirin and ibuprofen. These medicines can thin your blood. Do not take these medicines before your procedure if your health care provider instructs you not to.  Follow instructions from your health care provider about eating or drinking restrictions.  Let your health care provider know if you develop a cold or an infection before surgery.  Plan to have someone take you home after the procedure.  Ask your health care provider how your surgical site will be marked or identified.  You may be given antibiotic medicine to help prevent infection. PROCEDURE  To reduce your risk of infection:  Your health care team will wash or sanitize their hands.  Your skin will be washed with soap.  An IV tube may be inserted into one of your veins.  You will be given a medicine to make you fall asleep (general anesthetic).  A breathing tube will be placed in your mouth.  The surgeon  will make several small cuts (incisions) in your abdomen.  A thin, lighted tube (laparoscope) that has a tiny camera on the end will be inserted through one of the small incisions. The camera on the laparoscope will send a picture to a TV screen (monitor) in the operating room. This will give the surgeon a good view inside your  abdomen.  A gas will be pumped into your abdomen. This will expand your abdomen to give the surgeon more room to perform the surgery.  Other tools that are needed for the procedure will be inserted through the other incisions. The gallbladder will be removed through one of the incisions.  After your gallbladder has been removed, the incisions will be closed with stitches (sutures), staples, or skin glue.  Your incisions may be covered with a bandage (dressing). The procedure may vary among health care providers and hospitals. AFTER THE PROCEDURE  Your blood pressure, heart rate, breathing rate, and blood oxygen level will be monitored often until the medicines you were given have worn off.  You will be given medicines as needed to control your pain.   This information is not intended to replace advice given to you by your health care provider. Make sure you discuss any questions you have with your health care provider.   Document Released: 05/24/2005 Document Revised: 02/12/2015 Document Reviewed: 01/03/2013 Elsevier Interactive Patient Education 2016 [place] Diet for Pancreatitis or Gallbladder Conditions A low-fat diet can be helpful if you have pancreatitis or a gallbladder condition. With these conditions, your pancreas and gallbladder have trouble digesting fats. A healthy eating plan with less fat will help rest your pancreas and gallbladder and reduce your symptoms. WHAT DO I NEED TO KNOW ABOUT THIS DIET?  Eat a low-fat diet.  Reduce your fat intake to less than 20-30% of your total daily calories. This is less than 50-60 g of fat per day.  Remember that you need some fat in your diet. Ask your dietician what your daily goal should be.  Choose nonfat and low-fat healthy foods. Look for the words "nonfat," "low fat," or "fat free."  As a guide, look on the label and choose foods with less than 3 g of fat per serving. Eat only one serving.  Avoid  alcohol.  Do not smoke. If you need help quitting, talk with your health care provider.  Eat small frequent meals instead of three large heavy meals. WHAT FOODS CAN I EAT? Grains Include healthy grains and starches such as potatoes, wheat bread, fiber-rich cereal, and brown rice. Choose whole grain options whenever possible. In adults, whole grains should account for 45-65% of your daily calories.  Fruits and Vegetables Eat plenty of fruits and vegetables. Fresh fruits and vegetables add fiber to your diet. Meats and Other Protein Sources Eat lean meat such as chicken and pork. Trim any fat off of meat before cooking it. Eggs, fish, and beans are other sources of protein. In adults, these foods should account for 10-35% of your daily calories. Dairy Choose low-fat milk and dairy options. Dairy includes fat and protein, as well as calcium.  Fats and Oils Limit high-fat foods such as fried foods, sweets, baked goods, sugary drinks.  Other Creamy sauces and condiments, such as mayonnaise, can add extra fat. Think about whether or not you need to use them, or use smaller amounts or low fat options. WHAT FOODS ARE NOT RECOMMENDED?  High fat foods, such as:  [employer].  Ice  cream.  French toast.  Sweet rolls.  Pizza.  Cheese bread.  Foods covered with batter, butter, creamy sauces, or cheese.  Fried foods.  Sugary drinks and desserts.  Foods that cause gas or bloating   This information is not intended to replace advice given to you by your health care provider. Make sure you discuss any questions you have with your health care provider.   Document Released: 05/29/2013 Document Reviewed: 05/29/2013 Elsevier Interactive Patient Education [employer].

## 2016-04-08 NOTE — Progress Notes (Signed)
Patient ID: Tiffany Mathews, female   DOB: 01-06-1961, 55 y.o.   MRN: [id number]  CC: RUQ Pain HPI Tiffany Mathews is a 55 y.o. female who presents to clinic today for further evaluation of right upper quadrant abdominal pain. She has had an extensive workup for this through GI. Upon doing so she was found to have a decreased ejection fraction consistent with biliary dyskinesia. Patient reports she is been having postprandial right upper quadrant pain for at least the last 6 months. The pain is always in her midepigastric and right upper quadrant and is worsened with eating but improves with time. The pain does not radiate and is showing sharp in nature as well as daily without any nausea, vomiting. She denies any fevers, chills, chest pain, shortness of breath, change in appetite. She also made between constipation and diarrhea but this is been her [name] going on for many years. She is otherwise in her usual state of health.  HPI  Past Medical History:  Diagnosis Date  . Bipolar 1 disorder ([place])   . Hypertension     Past Surgical History:  Procedure Laterality Date  . ABLATION  04/2011  . BLADDER REPAIR W/ CESAREAN SECTION  [id number]  . BREAST BIOPSY Right 2009   -  . CESAREAN SECTION    . SHOULDER ARTHROSCOPY  2000    Family History  Problem Relation Age of Onset  . Thyroid cancer Mother   . Hypertension Mother   . Diabetes Mother   . Breast cancer Maternal Aunt 35  . Alzheimer's disease Father   . Stroke Father   . Stomach cancer Sister   . Diabetes Brother   . Hypertension Brother   . Heart attack Maternal Grandfather   . Diabetes Brother   . Hypertension Brother   . Alcohol abuse Brother   . Schizophrenia Brother     Social History Social History  Substance Use Topics  . Smoking status: Former Smoker    Packs/day: 1.00    Years: 20.00    Quit date: 2016  . Smokeless tobacco: Never Used  . Alcohol use No    Allergies  Allergen Reactions  . Depakote  [Divalproex Sodium] Other (See Comments)    Severe hair loss  . Lithium Other (See Comments)    Unknown; "It just made me feel bad."  . Lamictal [Lamotrigine] Rash and Other (See Comments)    Current Outpatient Prescriptions  Medication Sig Dispense Refill  . amphetamine-dextroamphetamine (ADDERALL) 5 MG tablet     . aspirin EC 81 MG tablet Take by mouth.    Marland Kitchen atorvastatin (LIPITOR) 40 MG tablet Take by mouth.    Marland Kitchen azelastine (ASTELIN) 0.1 % nasal spray Place 1 spray into both nostrils 2 (two) times daily. Use in each nostril as directed    . clonazePAM (KLONOPIN) 0.5 MG tablet Take 0.5 mg by mouth 2 (two) times daily as needed for anxiety.    . cyclobenzaprine (FLEXERIL) 10 MG tablet Take 10 mg by mouth 3 (three) times daily as needed for muscle spasms.    . fluticasone (FLONASE) 50 MCG/ACT nasal spray     . gabapentin (NEURONTIN) 100 MG capsule     . losartan (COZAAR) 100 MG tablet Take by mouth.    . pantoprazole (PROTONIX) 40 MG tablet Take 1 tablet (40 mg total) by mouth daily. 30 tablet 6  . QUEtiapine (SEROQUEL) 25 MG tablet     . sertraline (ZOLOFT) 100 MG tablet Take 100 mg by mouth daily.  No current facility-administered medications for this visit.      Review of Systems A Multi-point review of systems was asked and was negative except for the findings documented in the history of present illness  Physical Exam Blood pressure (!) 143/81, pulse 100, temperature 98.3 F (36.8 C), temperature source Oral, height 5\' 5"  (1.651 m), weight 88 kg (194 lb). CONSTITUTIONAL: No acute distress. EYES: Pupils are equal, round, and reactive to light, Sclera are non-icteric. EARS, NOSE, MOUTH AND THROAT: The oropharynx is clear. The oral mucosa is pink and moist. Hearing is intact to voice. LYMPH NODES:  Lymph nodes in the neck are normal. RESPIRATORY:  Lungs are clear. There is normal respiratory effort, with equal breath sounds bilaterally, and without pathologic use of accessory  muscles. CARDIOVASCULAR: Heart is regular without murmurs, gallops, or rubs. GI: The abdomen is soft, mildly tender to deep palpation in the right upper quadrant with a negative Murphy sign, and nondistended. There are no palpable masses. There is no hepatosplenomegaly. There are normal bowel sounds in all quadrants. GU: Rectal deferred.   MUSCULOSKELETAL: Normal muscle strength and tone. No cyanosis or edema.   SKIN: Turgor is good and there are no pathologic skin lesions or ulcers. NEUROLOGIC: Motor and sensation is grossly normal. Cranial nerves are grossly intact. PSYCH:  Oriented to person, place and time. Affect is normal.  Data Reviewed Images and labs reviewed. She's not had any labs since February of this year at which point were all within normal limits. She had an ultrasound of the right upper quadrant that failed to identify any biliary pathology, no gallstones, wall thickening, pericholecystic fluid, ductal dilatation. She then had a HIDA scan last month as well which showed a decreased ejection fraction of 25%. I have personally reviewed the patient's imaging, laboratory findings and medical records.    Assessment    Biliary dyskinesia    Plan    55 year old female with right upper quadrant pain. Discussed the diagnosis of biliary dyskinesia at length with the patient. Discussed the treatment options to include a laparoscopic cholecystectomy. I discussed the procedure in detail.  The patient was given Neurosurgeon.  We discussed the risks and benefits of a laparoscopic cholecystectomy and possible cholangiogram including, but not limited to bleeding, infection, injury to surrounding structures such as the intestine or liver, bile leak, retained gallstones, need to convert to an open procedure, prolonged diarrhea, blood clots such as  DVT, common bile duct injury, anesthesia risks, and possible need for additional procedures.  The likelihood of improvement in symptoms and  return to the patient's normal status is good. We discussed the typical post-operative recovery course. Patient voiced understanding and desired to proceed however she wished to complete her semester school work. She'll return to clinic in 5 weeks with plans for surgery the week before Christmas. We discussed standard laparoscopy versus robotic and currently she is desiring a robotic surgery.      Time spent with the patient was 45 minutes, with more than 50% of the time spent in face-to-face education, counseling and care coordination.     Clayburn Pert, MD FACS General Surgeon 04/08/2016, 11:39 AM

## 2016-04-09 ENCOUNTER — Telehealth: Payer: Self-pay | Admitting: General Surgery

## 2016-04-09 NOTE — Telephone Encounter (Signed)
Pt advised of pre op date/time and sx date. Sx: 05/27/16 with Dr Eddie Dibbles assisted laparoscopic cholecystectomy.  Pre op: 05/20/16 @ 8:00am--Office.   Patient made aware to call [phone], between 1-3:00pm the day before surgery, to find out what time to arrive.

## 2016-04-22 DIAGNOSIS — F3132 Bipolar disorder, current episode depressed, moderate: Secondary | ICD-10-CM | POA: Diagnosis not present

## 2016-04-22 DIAGNOSIS — F4001 Agoraphobia with panic disorder: Secondary | ICD-10-CM | POA: Diagnosis not present

## 2016-05-06 DIAGNOSIS — F4011 Social phobia, generalized: Secondary | ICD-10-CM | POA: Diagnosis not present

## 2016-05-06 DIAGNOSIS — F411 Generalized anxiety disorder: Secondary | ICD-10-CM | POA: Diagnosis not present

## 2016-05-06 DIAGNOSIS — F900 Attention-deficit hyperactivity disorder, predominantly inattentive type: Secondary | ICD-10-CM | POA: Diagnosis not present

## 2016-05-06 DIAGNOSIS — F3181 Bipolar II disorder: Secondary | ICD-10-CM | POA: Diagnosis not present

## 2016-05-13 ENCOUNTER — Ambulatory Visit (INDEPENDENT_AMBULATORY_CARE_PROVIDER_SITE_OTHER): Payer: Medicare Other | Admitting: General Surgery

## 2016-05-13 ENCOUNTER — Encounter: Payer: Self-pay | Admitting: General Surgery

## 2016-05-13 VITALS — BP 150/84 | HR 95 | Temp 98.2°F | Ht 65.0 in | Wt 195.5 lb

## 2016-05-13 DIAGNOSIS — K828 Other specified diseases of gallbladder: Secondary | ICD-10-CM | POA: Diagnosis not present

## 2016-05-13 NOTE — Patient Instructions (Addendum)
We have your surgery scheduled on 05/27/16 and your pre-op appointment 05/20/16 at 8:00 am. Please call our office if you have any questions.

## 2016-05-13 NOTE — Progress Notes (Signed)
Outpatient Surgical Follow Up  05/13/2016  Tiffany Mathews is an 55 y.o. female.   Chief Complaint  Patient presents with  . Follow-up    Update H&P -Surgery scheduled 05/27/16    HPI: 55 year old female returns to clinic for follow-up of biliary dyskinesia. She is previously seen for this and has are to been scheduled for robotic assisted laparoscopic cholecystectomy. She is here to update her H&P. She reports that since her last visit she is continuing to have intermittent right upper quadrant pains. This is especially if she doesn't eat the appropriate foods. However, when she abide by low-fat diet she has minimal symptoms of right upper quadrant abdominal pain. She also reports since her last visit her anxiety medications have been changed. These are documented within the chart. She denies any current fevers, chills, nausea, vomiting, chest pain, short of breath, diarrhea, constipation.  Past Medical History:  Diagnosis Date  . Bipolar 1 disorder ([place])   . Hypertension     Past Surgical History:  Procedure Laterality Date  . ABLATION  04/2011  . BLADDER REPAIR W/ CESAREAN SECTION  [id number]  . BREAST BIOPSY Right 2009   -  . CESAREAN SECTION    . SHOULDER ARTHROSCOPY  2000    Family History  Problem Relation Age of Onset  . Thyroid cancer Mother   . Hypertension Mother   . Diabetes Mother   . Breast cancer Maternal Aunt 22  . Alzheimer's disease Father   . Stroke Father   . Stomach cancer Sister   . Diabetes Brother   . Hypertension Brother   . Heart attack Maternal Grandfather   . Diabetes Brother   . Hypertension Brother   . Alcohol abuse Brother   . Schizophrenia Brother     Social History:  reports that she quit smoking about 23 months ago. She has a 20.00 pack-year smoking history. She has never used smokeless tobacco. She reports that she does not drink alcohol or use drugs.  Allergies:  Allergies  Allergen Reactions  . Depakote [Divalproex Sodium]  Other (See Comments)    Severe hair loss  . Lithium Other (See Comments)    Unknown; "It just made me feel bad."  . Lamictal [Lamotrigine] Rash and Other (See Comments)    Medications reviewed.    ROS A multipoint review of systems was completed, all pertinent positives and negatives are documented within the history of present illness and remainder are negative.   BP (!) 150/84   Pulse 95   Temp 98.2 F (36.8 C) (Oral)   Ht 5\' 5"  (1.651 m)   Wt 88.7 kg (195 lb 8 oz)   BMI 32.53 kg/m   Physical Exam Gen.: No acute distress Neck: Supple and nontender Chest: Clear to auscultation Heart: Regular rate and rhythm Abdomen: Large, soft, nontender, nondistended. Extremities: Moves all extremities without cyanosis or edema.    No results found for this or any previous visit (from the past 48 hour(s)). No results found.  Assessment/Plan:  1. Biliary dyskinesia 55 year old female with biliary dyskinesia causing biliary colic. I again discussed the procedure in detail.  We reviewed the risks and benefits of a laparoscopic cholecystectomy and possible cholangiogram including, but not limited to bleeding, infection, injury to surrounding structures such as the intestine or liver, bile leak, retained gallstones, need to convert to an open procedure, prolonged diarrhea, blood clots such as  DVT, common bile duct injury, anesthesia risks, and possible need for additional procedures.  The likelihood of improvement  in symptoms and return to the patient's normal status is good. We discussed the typical post-operative recovery course. Patient again voiced understanding and reiterated that she is ready to proceed with having her gallbladder removed. Her surgeries scheduled for December 21.  A total of 25 minutes was used for this encounter, greater than 50% of it was used for counseling and coordination of care.    Clayburn Pert, MD FACS General Surgeon  05/13/2016,10:04 AM

## 2016-05-19 DIAGNOSIS — F4001 Agoraphobia with panic disorder: Secondary | ICD-10-CM | POA: Diagnosis not present

## 2016-05-19 DIAGNOSIS — F3132 Bipolar disorder, current episode depressed, moderate: Secondary | ICD-10-CM | POA: Diagnosis not present

## 2016-05-20 ENCOUNTER — Encounter
Admission: RE | Admit: 2016-05-20 | Discharge: 2016-05-20 | Disposition: A | Discharge status: Home / Self Care | Payer: Medicare Other | Source: Ambulatory Visit | Attending: General Surgery | Admitting: General Surgery

## 2016-05-20 DIAGNOSIS — Z01818 Encounter for other preprocedural examination: Secondary | ICD-10-CM | POA: Insufficient documentation

## 2016-05-20 DIAGNOSIS — I498 Other specified cardiac arrhythmias: Secondary | ICD-10-CM | POA: Diagnosis not present

## 2016-05-20 DIAGNOSIS — I10 Essential (primary) hypertension: Secondary | ICD-10-CM | POA: Insufficient documentation

## 2016-05-20 DIAGNOSIS — K828 Other specified diseases of gallbladder: Secondary | ICD-10-CM | POA: Insufficient documentation

## 2016-05-20 HISTORY — DX: Unspecified osteoarthritis, unspecified site: M19.90

## 2016-05-20 HISTORY — DX: Sleep apnea, unspecified: G47.30

## 2016-05-20 HISTORY — DX: Gastro-esophageal reflux disease without esophagitis: K21.9

## 2016-05-20 HISTORY — DX: Hyperlipidemia, unspecified: E78.5

## 2016-05-20 HISTORY — DX: Fibromyalgia: M79.7

## 2016-05-20 NOTE — Patient Instructions (Signed)
  Your procedure is scheduled [id number]. 05/27/16 Report to Day Surgery. To find out your arrival time please call [phone] between [place] on Wed. 05/26/16.  Remember: Instructions that are not followed completely may result in serious medical risk, up to and including death, or upon the discretion of your surgeon and anesthesiologist your surgery may need to be rescheduled.    __x__ 1. Do not eat food or drink liquids after midnight. No gum chewing or hard candies.     __x__ 2. No Alcohol for 24 hours before or after surgery.   ____ 3. Do Not Smoke For 24 Hours Prior to Your Surgery.   ____ 4. Bring all medications with you on the day of surgery if instructed.    __x__ 5. Notify your doctor if there is any change in your medical condition     (cold, fever, infections).       Do not wear jewelry, make-up, hairpins, clips or nail polish.  Do not wear lotions, powders, or perfumes. You may wear deodorant.  Do not shave 48 hours prior to surgery. Men may shave face and neck.  Do not bring valuables to the hospital.    [facility] is not responsible for any belongings or valuables.               Contacts, dentures or bridgework may not be worn into surgery.  Leave your suitcase in the car. After surgery it may be brought to your room.  For patients admitted to the hospital, discharge time is determined by your                treatment team.   Patients discharged the day of surgery will not be allowed to drive home.   Please read over the following fact sheets that you were given:      _x___ Take these medicines the morning of surgery with A SIP OF WATER:    1. ARIPiprazole (ABILIFY) 5 MG tablet  2. azelastine (ASTELIN) 0.1 % nasal spray  3. clonazePAM (KLONOPIN) 0.5 MG tablet  4.fluticasone (FLONASE) 50 MCG/ACT nasal spray  5.gabapentin (NEURONTIN) 100 MG capsule only if needed  6. losartan (COZAAR) 100 MG tablet    pantoprazole (PROTONIX) 40 MG tablet sertraline  (ZOLOFT) 100 MG tablet Tylenol if needed for pain  ____ Fleet Enema (as directed)   _x___ Use CHG Soap as directed  ____ Use inhalers on the day of surgery  ____ Stop metformin 2 days prior to surgery    ____ Take 1/2 of usual insulin dose the night before surgery and none on the morning of surgery.   _x___ Stop aspirin on today _x___ Stop Anti-inflammatories on today  Tylenol   ____ Stop supplements until after surgery.    _x___ Bring C-Pap to the hospital.

## 2016-05-27 ENCOUNTER — Ambulatory Visit: Payer: Medicare Other | Admitting: Anesthesiology

## 2016-05-27 ENCOUNTER — Ambulatory Visit
Admission: RE | Admit: 2016-05-27 | Discharge: 2016-05-27 | Disposition: A | Discharge status: Home / Self Care | Payer: Medicare Other | Source: Ambulatory Visit | Attending: General Surgery | Admitting: General Surgery

## 2016-05-27 ENCOUNTER — Encounter: Payer: Self-pay | Admitting: *Deleted

## 2016-05-27 ENCOUNTER — Encounter
Admission: RE | Disposition: A | Discharge status: Home / Self Care | Payer: Self-pay | Source: Ambulatory Visit | Attending: General Surgery

## 2016-05-27 DIAGNOSIS — M1990 Unspecified osteoarthritis, unspecified site: Secondary | ICD-10-CM | POA: Diagnosis not present

## 2016-05-27 DIAGNOSIS — K828 Other specified diseases of gallbladder: Secondary | ICD-10-CM

## 2016-05-27 DIAGNOSIS — Z87891 Personal history of nicotine dependence: Secondary | ICD-10-CM | POA: Insufficient documentation

## 2016-05-27 DIAGNOSIS — G4730 Sleep apnea, unspecified: Secondary | ICD-10-CM | POA: Diagnosis not present

## 2016-05-27 DIAGNOSIS — M797 Fibromyalgia: Secondary | ICD-10-CM | POA: Insufficient documentation

## 2016-05-27 DIAGNOSIS — F319 Bipolar disorder, unspecified: Secondary | ICD-10-CM | POA: Insufficient documentation

## 2016-05-27 DIAGNOSIS — K219 Gastro-esophageal reflux disease without esophagitis: Secondary | ICD-10-CM | POA: Diagnosis not present

## 2016-05-27 DIAGNOSIS — I10 Essential (primary) hypertension: Secondary | ICD-10-CM | POA: Diagnosis not present

## 2016-05-27 DIAGNOSIS — F419 Anxiety disorder, unspecified: Secondary | ICD-10-CM | POA: Insufficient documentation

## 2016-05-27 DIAGNOSIS — K811 Chronic cholecystitis: Secondary | ICD-10-CM | POA: Insufficient documentation

## 2016-05-27 HISTORY — PX: ROBOTIC ASSISTED LAPAROSCOPIC CHOLECYSTECTOMY: SHX6521

## 2016-05-27 HISTORY — PX: LYSIS OF ADHESION: SHX5961

## 2016-05-27 SURGERY — ROBOTIC ASSISTED LAPAROSCOPIC CHOLECYSTECTOMY
Anesthesia: General | Wound class: Clean Contaminated

## 2016-05-27 MED ORDER — SUGAMMADEX SODIUM 200 MG/2ML IV SOLN
INTRAVENOUS | Status: DC | PRN
  Administered 2016-05-27: 350 mg via INTRAVENOUS

## 2016-05-27 MED ORDER — DEXAMETHASONE SODIUM PHOSPHATE 10 MG/ML IJ SOLN
INTRAMUSCULAR | Status: AC
  Filled 2016-05-27: qty 1

## 2016-05-27 MED ORDER — LIDOCAINE HCL (PF) 1 % IJ SOLN
INTRAMUSCULAR | Status: AC
Start: 2016-05-27 — End: 2016-05-27
  Filled 2016-05-27: qty 30

## 2016-05-27 MED ORDER — PHENYLEPHRINE HCL 10 MG/ML IJ SOLN
INTRAMUSCULAR | Status: DC | PRN
  Administered 2016-05-27 (×5): 80 ug via INTRAVENOUS

## 2016-05-27 MED ORDER — BUPIVACAINE HCL 0.5 % IJ SOLN
INTRAMUSCULAR | Status: DC | PRN
  Administered 2016-05-27: 30 mL via SUBCUTANEOUS

## 2016-05-27 MED ORDER — FENTANYL CITRATE (PF) 100 MCG/2ML IJ SOLN
INTRAMUSCULAR | Status: AC
  Filled 2016-05-27: qty 2

## 2016-05-27 MED ORDER — INDOCYANINE GREEN 25 MG IV SOLR
1.5000 mg | Freq: Once | INTRAVENOUS | Status: AC
  Administered 2016-05-27: 1.5 mg via INTRAVENOUS
  Filled 2016-05-27: qty 25

## 2016-05-27 MED ORDER — ACETAMINOPHEN 10 MG/ML IV SOLN
INTRAVENOUS | Status: DC | PRN
  Administered 2016-05-27: 1000 mg via INTRAVENOUS

## 2016-05-27 MED ORDER — OXYCODONE HCL 5 MG PO TABS
5.0000 mg | ORAL_TABLET | Freq: Once | ORAL | Status: AC | PRN
  Administered 2016-05-27: 5 mg via ORAL

## 2016-05-27 MED ORDER — SUCCINYLCHOLINE CHLORIDE 200 MG/10ML IV SOSY
PREFILLED_SYRINGE | INTRAVENOUS | Status: AC
  Filled 2016-05-27: qty 10

## 2016-05-27 MED ORDER — INDOCYANINE GREEN 25 MG IV SOLR
INTRAVENOUS | Status: AC
  Filled 2016-05-27: qty 25

## 2016-05-27 MED ORDER — SODIUM CHLORIDE 0.9 % IV SOLN
INTRAVENOUS | Status: DC | PRN
  Administered 2016-05-27: 25 ug/min via INTRAVENOUS

## 2016-05-27 MED ORDER — ONDANSETRON HCL 4 MG/2ML IJ SOLN
INTRAMUSCULAR | Status: AC
  Filled 2016-05-27: qty 2

## 2016-05-27 MED ORDER — DEXAMETHASONE SODIUM PHOSPHATE 10 MG/ML IJ SOLN
INTRAMUSCULAR | Status: DC | PRN
  Administered 2016-05-27: 10 mg via INTRAVENOUS

## 2016-05-27 MED ORDER — EPHEDRINE 5 MG/ML INJ
INTRAVENOUS | Status: AC
  Filled 2016-05-27: qty 10

## 2016-05-27 MED ORDER — ONDANSETRON HCL 4 MG/2ML IJ SOLN
INTRAMUSCULAR | Status: DC | PRN
  Administered 2016-05-27: 4 mg via INTRAVENOUS

## 2016-05-27 MED ORDER — SODIUM CHLORIDE 0.9 % IJ SOLN
INTRAMUSCULAR | Status: AC
  Filled 2016-05-27: qty 10

## 2016-05-27 MED ORDER — FENTANYL CITRATE (PF) 100 MCG/2ML IJ SOLN
25.0000 ug | INTRAMUSCULAR | Status: DC | PRN
  Administered 2016-05-27 (×2): 50 ug via INTRAVENOUS

## 2016-05-27 MED ORDER — CHLORHEXIDINE GLUCONATE CLOTH 2 % EX PADS: 6.0000 | MEDICATED_PAD | Freq: Once | CUTANEOUS | Status: DC

## 2016-05-27 MED ORDER — ROCURONIUM BROMIDE 10 MG/ML (PF) SYRINGE
PREFILLED_SYRINGE | INTRAVENOUS | Status: AC
  Filled 2016-05-27: qty 10

## 2016-05-27 MED ORDER — GLYCOPYRROLATE 0.2 MG/ML IJ SOLN
INTRAMUSCULAR | Status: AC
  Filled 2016-05-27: qty 1

## 2016-05-27 MED ORDER — PHENYLEPHRINE HCL 10 MG/ML IJ SOLN
INTRAMUSCULAR | Status: AC
  Filled 2016-05-27: qty 1

## 2016-05-27 MED ORDER — PROPOFOL 10 MG/ML IV BOLUS
INTRAVENOUS | Status: AC
  Filled 2016-05-27: qty 40

## 2016-05-27 MED ORDER — ACETAMINOPHEN 10 MG/ML IV SOLN
INTRAVENOUS | Status: AC
  Filled 2016-05-27: qty 100

## 2016-05-27 MED ORDER — KETOROLAC TROMETHAMINE 30 MG/ML IJ SOLN
INTRAMUSCULAR | Status: DC | PRN
  Administered 2016-05-27: 30 mg via INTRAVENOUS

## 2016-05-27 MED ORDER — MIDAZOLAM HCL 2 MG/2ML IJ SOLN
INTRAMUSCULAR | Status: AC
  Filled 2016-05-27: qty 2

## 2016-05-27 MED ORDER — SUGAMMADEX SODIUM 200 MG/2ML IV SOLN
INTRAVENOUS | Status: AC
  Filled 2016-05-27: qty 2

## 2016-05-27 MED ORDER — HYDROCODONE-ACETAMINOPHEN 5-325 MG PO TABS: 1.0000 | ORAL_TABLET | Freq: Four times a day (QID) | ORAL | 0 refills | Status: DC | PRN

## 2016-05-27 MED ORDER — BUPIVACAINE HCL (PF) 0.5 % IJ SOLN
INTRAMUSCULAR | Status: AC
  Filled 2016-05-27: qty 30

## 2016-05-27 MED ORDER — OXYCODONE HCL 5 MG/5ML PO SOLN: 5.0000 mg | Freq: Once | ORAL | Status: AC | PRN

## 2016-05-27 MED ORDER — PHENYLEPHRINE 40 MCG/ML (10ML) SYRINGE FOR IV PUSH (FOR BLOOD PRESSURE SUPPORT)
PREFILLED_SYRINGE | INTRAVENOUS | Status: AC
  Filled 2016-05-27: qty 10

## 2016-05-27 MED ORDER — OXYCODONE HCL 5 MG PO TABS
ORAL_TABLET | ORAL | Status: AC
  Administered 2016-05-27: 5 mg via ORAL
  Filled 2016-05-27: qty 1

## 2016-05-27 MED ORDER — PROPOFOL 10 MG/ML IV BOLUS
INTRAVENOUS | Status: DC | PRN
Start: 2016-05-27 — End: 2016-05-27
  Administered 2016-05-27: 160 mg via INTRAVENOUS

## 2016-05-27 MED ORDER — LACTATED RINGERS IV SOLN
INTRAVENOUS | Status: DC
  Administered 2016-05-27: 07:00:00 via INTRAVENOUS

## 2016-05-27 MED ORDER — MIDAZOLAM HCL 2 MG/2ML IJ SOLN
INTRAMUSCULAR | Status: DC | PRN
  Administered 2016-05-27: 2 mg via INTRAVENOUS

## 2016-05-27 MED ORDER — FENTANYL CITRATE (PF) 100 MCG/2ML IJ SOLN
INTRAMUSCULAR | Status: DC | PRN
  Administered 2016-05-27: 100 ug via INTRAVENOUS
  Administered 2016-05-27: 50 ug via INTRAVENOUS

## 2016-05-27 MED ORDER — SODIUM CHLORIDE 0.9 % IV SOLN
INTRAVENOUS | Status: DC | PRN
  Administered 2016-05-27: 08:00:00 via INTRAVENOUS

## 2016-05-27 MED ORDER — KETOROLAC TROMETHAMINE 30 MG/ML IJ SOLN
INTRAMUSCULAR | Status: AC
  Filled 2016-05-27: qty 1

## 2016-05-27 MED ORDER — SUCCINYLCHOLINE CHLORIDE 20 MG/ML IJ SOLN
INTRAMUSCULAR | Status: DC | PRN
  Administered 2016-05-27: 100 mg via INTRAVENOUS

## 2016-05-27 MED ORDER — FENTANYL CITRATE (PF) 100 MCG/2ML IJ SOLN
INTRAMUSCULAR | Status: AC
  Administered 2016-05-27: 50 ug via INTRAVENOUS
  Filled 2016-05-27: qty 2

## 2016-05-27 MED ORDER — DEXTROSE 5 % IV SOLN
1.0000 g | INTRAVENOUS | Status: AC
  Administered 2016-05-27: 1 g via INTRAVENOUS
  Filled 2016-05-27: qty 1

## 2016-05-27 MED ORDER — LACTATED RINGERS IV SOLN
INTRAVENOUS | Status: DC | PRN
  Administered 2016-05-27: 07:00:00 via INTRAVENOUS

## 2016-05-27 MED ORDER — SEVOFLURANE IN SOLN
RESPIRATORY_TRACT | Status: AC
  Filled 2016-05-27: qty 250

## 2016-05-27 MED ORDER — LIDOCAINE 2% (20 MG/ML) 5 ML SYRINGE
INTRAMUSCULAR | Status: AC
  Filled 2016-05-27: qty 5

## 2016-05-27 MED ORDER — LIDOCAINE HCL (CARDIAC) 20 MG/ML IV SOLN
INTRAVENOUS | Status: DC | PRN
  Administered 2016-05-27: 80 mg via INTRAVENOUS

## 2016-05-27 MED ORDER — ROCURONIUM BROMIDE 100 MG/10ML IV SOLN
INTRAVENOUS | Status: DC | PRN
Start: 2016-05-27 — End: 2016-05-27
  Administered 2016-05-27: 50 mg via INTRAVENOUS
  Administered 2016-05-27 (×2): 10 mg via INTRAVENOUS

## 2016-05-27 SURGICAL SUPPLY — 41 items
ADHESIVE MASTISOL STRL (MISCELLANEOUS) ×4 IMPLANT
BLADE SURG SZ11 CARB STEEL (BLADE) ×4 IMPLANT
CANISTER SUCT 1200ML W/VALVE (MISCELLANEOUS) ×4 IMPLANT
CHLORAPREP W/TINT 26ML (MISCELLANEOUS) ×4 IMPLANT
CLIP LIGATING HEMO O LOK GREEN (MISCELLANEOUS) ×8 IMPLANT
CORD BIP STRL DISP 12FT (MISCELLANEOUS) ×4 IMPLANT
COVER TIP SHEARS 8 DVNC (MISCELLANEOUS) ×2 IMPLANT
COVER TIP SHEARS 8MM DA VINCI (MISCELLANEOUS) ×2
DEFOGGER SCOPE WARMER CLEARIFY (MISCELLANEOUS) ×4 IMPLANT
DRAPE 3 ARM ACCESS DA VINCI (DRAPES) ×2
DRAPE 3 ARM ACCESS DVNC (DRAPES) ×2 IMPLANT
DRESSING TELFA 4X3 1S ST N-ADH (GAUZE/BANDAGES/DRESSINGS) ×4 IMPLANT
DRSG TEGADERM 4X4.75 (GAUZE/BANDAGES/DRESSINGS) ×16 IMPLANT
ELECT REM PT RETURN 9FT ADLT (ELECTROSURGICAL) ×4
ELECTRODE REM PT RTRN 9FT ADLT (ELECTROSURGICAL) ×2 IMPLANT
GLOVE BIO SURGEON STRL SZ7.5 (GLOVE) ×8 IMPLANT
GLOVE INDICATOR 8.0 STRL GRN (GLOVE) ×8 IMPLANT
GOWN STRL REUS W/ TWL LRG LVL3 (GOWN DISPOSABLE) ×6 IMPLANT
GOWN STRL REUS W/TWL LRG LVL3 (GOWN DISPOSABLE) ×6
IRRIGATION STRYKERFLOW (MISCELLANEOUS) ×2 IMPLANT
IRRIGATOR STRYKERFLOW (MISCELLANEOUS) ×4
IV NS 1000ML (IV SOLUTION) ×2
IV NS 1000ML BAXH (IV SOLUTION) ×2 IMPLANT
KIT PINK PAD W/HEAD ARE REST (MISCELLANEOUS) ×4
KIT PINK PAD W/HEAD ARM REST (MISCELLANEOUS) ×2 IMPLANT
L-HOOK LAP DISP 36CM (ELECTROSURGICAL) ×4
LHOOK LAP DISP 36CM (ELECTROSURGICAL) ×2 IMPLANT
NDL SAFETY 22GX1.5 (NEEDLE) ×4 IMPLANT
NS IRRIG 500ML POUR BTL (IV SOLUTION) ×4 IMPLANT
PACK LAP CHOLECYSTECTOMY (MISCELLANEOUS) ×4 IMPLANT
PENCIL ELECTRO HAND CTR (MISCELLANEOUS) ×4 IMPLANT
POUCH ENDO CATCH 10MM SPEC (MISCELLANEOUS) ×4 IMPLANT
SCISSORS METZENBAUM CVD 33 (INSTRUMENTS) ×4 IMPLANT
SOLUTION ELECTROLUBE (MISCELLANEOUS) ×4 IMPLANT
SUT MNCRL 4-0 (SUTURE) ×2
SUT MNCRL 4-0 27XMFL (SUTURE) ×2
SUT VICRYL 0 AB UR-6 (SUTURE) ×8 IMPLANT
SUTURE MNCRL 4-0 27XMF (SUTURE) ×2 IMPLANT
TROCAR XCEL 12X100 BLDLESS (ENDOMECHANICALS) ×4 IMPLANT
TROCAR XCEL NON-BLD 5MMX100MML (ENDOMECHANICALS) ×4 IMPLANT
TUBING INSUFFLATOR HI FLOW (MISCELLANEOUS) ×4 IMPLANT

## 2016-05-27 NOTE — Brief Op Note (Signed)
05/27/2016  9:22 AM  PATIENT:  Tiffany Mathews  55 y.o. female  PRE-OPERATIVE DIAGNOSIS:  BILIARY DYSKINESIA  POST-OPERATIVE DIAGNOSIS:  BILIARY DYSKINESIA  PROCEDURE:  Procedure(s): ROBOTIC ASSISTED LAPAROSCOPIC CHOLECYSTECTOMY (N/A) LYSIS OF ADHESION  SURGEON:  Surgeon(s) and Role:    * Clayburn Pert, MD - Primary  PHYSICIAN ASSISTANT:   ASSISTANTS: none   ANESTHESIA:   general  EBL:  Total I/O In: 1200 [I.V.:1200] Out: 10 [Blood:10]  BLOOD ADMINISTERED:none  DRAINS: none   LOCAL MEDICATIONS USED:  MARCAINE   , XYLOCAINE  and Amount: 30 ml  SPECIMEN:  Source of Specimen:  gallbladder  DISPOSITION OF SPECIMEN:  PATHOLOGY  COUNTS:  YES  TOURNIQUET:  * No tourniquets in log *  DICTATION: .Dragon Dictation  PLAN OF CARE: Discharge to home after PACU  PATIENT DISPOSITION:  PACU - hemodynamically stable.   Delay start of Pharmacological VTE agent (>24hrs) due to surgical blood loss or risk of bleeding: no

## 2016-05-27 NOTE — Interval H&P Note (Signed)
History and Physical Interval Note:  05/27/2016 6:55 AM  Tiffany Mathews  has presented today for surgery, with the diagnosis of BILIARY DYSKINESIA  The various methods of treatment have been discussed with the patient and family. After consideration of risks, benefits and other options for treatment, the patient has consented to  Procedure(s): ROBOTIC ASSISTED LAPAROSCOPIC CHOLECYSTECTOMY (N/A) as a surgical intervention .  The patient's history has been reviewed, patient examined, no change in status, stable for surgery.  I have reviewed the patient's chart and labs.  Questions were answered to the patient's satisfaction.     Clayburn Pert

## 2016-05-27 NOTE — H&P (View-Only) (Signed)
Outpatient Surgical Follow Up  05/13/2016  Tiffany Mathews is an 55 y.o. female.   Chief Complaint  Patient presents with  . Follow-up    Update H&P -Surgery scheduled 05/27/16    HPI: 55 year old female returns to clinic for follow-up of biliary dyskinesia. She is previously seen for this and has are to been scheduled for robotic assisted laparoscopic cholecystectomy. She is here to update her H&P. She reports that since her last visit she is continuing to have intermittent right upper quadrant pains. This is especially if she doesn't eat the appropriate foods. However, when she abide by low-fat diet she has minimal symptoms of right upper quadrant abdominal pain. She also reports since her last visit her anxiety medications have been changed. These are documented within the chart. She denies any current fevers, chills, nausea, vomiting, chest pain, short of breath, diarrhea, constipation.  Past Medical History:  Diagnosis Date  . Bipolar 1 disorder ([place])   . Hypertension     Past Surgical History:  Procedure Laterality Date  . ABLATION  04/2011  . BLADDER REPAIR W/ CESAREAN SECTION  [id number]  . BREAST BIOPSY Right 2009   -  . CESAREAN SECTION    . SHOULDER ARTHROSCOPY  2000    Family History  Problem Relation Age of Onset  . Thyroid cancer Mother   . Hypertension Mother   . Diabetes Mother   . Breast cancer Maternal Aunt 56  . Alzheimer's disease Father   . Stroke Father   . Stomach cancer Sister   . Diabetes Brother   . Hypertension Brother   . Heart attack Maternal Grandfather   . Diabetes Brother   . Hypertension Brother   . Alcohol abuse Brother   . Schizophrenia Brother     Social History:  reports that she quit smoking about 23 months ago. She has a 20.00 pack-year smoking history. She has never used smokeless tobacco. She reports that she does not drink alcohol or use drugs.  Allergies:  Allergies  Allergen Reactions  . Depakote [Divalproex Sodium]  Other (See Comments)    Severe hair loss  . Lithium Other (See Comments)    Unknown; "It just made me feel bad."  . Lamictal [Lamotrigine] Rash and Other (See Comments)    Medications reviewed.    ROS A multipoint review of systems was completed, all pertinent positives and negatives are documented within the history of present illness and remainder are negative.   BP (!) 150/84   Pulse 95   Temp 98.2 F (36.8 C) (Oral)   Ht 5\' 5"  (1.651 m)   Wt 88.7 kg (195 lb 8 oz)   BMI 32.53 kg/m   Physical Exam Gen.: No acute distress Neck: Supple and nontender Chest: Clear to auscultation Heart: Regular rate and rhythm Abdomen: Large, soft, nontender, nondistended. Extremities: Moves all extremities without cyanosis or edema.    No results found for this or any previous visit (from the past 48 hour(s)). No results found.  Assessment/Plan:  1. Biliary dyskinesia 55 year old female with biliary dyskinesia causing biliary colic. I again discussed the procedure in detail.  We reviewed the risks and benefits of a laparoscopic cholecystectomy and possible cholangiogram including, but not limited to bleeding, infection, injury to surrounding structures such as the intestine or liver, bile leak, retained gallstones, need to convert to an open procedure, prolonged diarrhea, blood clots such as  DVT, common bile duct injury, anesthesia risks, and possible need for additional procedures.  The likelihood of improvement  in symptoms and return to the patient's normal status is good. We discussed the typical post-operative recovery course. Patient again voiced understanding and reiterated that she is ready to proceed with having her gallbladder removed. Her surgeries scheduled for December 21.  A total of 25 minutes was used for this encounter, greater than 50% of it was used for counseling and coordination of care.    Clayburn Pert, MD FACS General Surgeon  05/13/2016,10:04 AM

## 2016-05-27 NOTE — Anesthesia Preprocedure Evaluation (Signed)
Anesthesia Evaluation  Patient identified by MRN, date of birth, ID band Patient awake    Reviewed: Allergy & Precautions, H&P , NPO status , Patient's Chart, lab work & pertinent test results  History of Anesthesia Complications Negative for: history of anesthetic complications  Airway Mallampati: III  TM Distance: <3 FB Neck ROM: full    Dental no notable dental hx. (+) Poor Dentition, Chipped   Pulmonary neg shortness of breath, sleep apnea , former smoker,    Pulmonary exam normal breath sounds clear to auscultation       Cardiovascular Exercise Tolerance: Good hypertension, (-) angina(-) Past MI Normal cardiovascular exam Rhythm:regular Rate:Normal     Neuro/Psych PSYCHIATRIC DISORDERS Depression Bipolar Disorder  Neuromuscular disease    GI/Hepatic Neg liver ROS, hiatal hernia, GERD  Medicated and Controlled,  Endo/Other  negative endocrine ROS  Renal/GU      Musculoskeletal  (+) Arthritis , Fibromyalgia -  Abdominal   Peds  Hematology negative hematology ROS (+)   Anesthesia Other Findings Past Medical History: No date: Arthritis No date: Bipolar 1 disorder ([facility]) No date: Fibromyalgia No date: GERD (gastroesophageal reflux disease) No date: Hyperlipidemia No date: Hypertension No date: Sleep apnea  Past Surgical History: 04/2011: ABLATION [id number]: BLADDER REPAIR W/ CESAREAN SECTION 2009: BREAST BIOPSY Right     Comment: - No date: CESAREAN SECTION 2000: SHOULDER ARTHROSCOPY No date: TUBAL LIGATION     Reproductive/Obstetrics negative OB ROS                             Anesthesia Physical Anesthesia Plan  ASA: III  Anesthesia Plan: General ETT   Post-op Pain Management:    Induction:   Airway Management Planned:   Additional Equipment:   Intra-op Plan:   Post-operative Plan:   Informed Consent: I have reviewed the patients History and Physical,  chart, labs and discussed the procedure including the risks, benefits and alternatives for the proposed anesthesia with the patient or authorized representative who has indicated his/her understanding and acceptance.   Dental Advisory Given  Plan Discussed with: Anesthesiologist, CRNA and Surgeon  Anesthesia Plan Comments:         Anesthesia Quick Evaluation

## 2016-05-27 NOTE — Op Note (Signed)
Robotic Assisted Laparoscopic Cholecystectomy  Pre-operative Diagnosis: Biliary Dyskinesia  Post-operative Diagnosis: Biliary dyskinesia, supraumbilical hernia  Procedure: Robotic Assisted Laparoscopic Cholecystectomy with lysis of adhesions  Surgeon: Juanda Crumble T. Adonis Huguenin, MD FACS  Anesthesia: Gen. with endotracheal tube  Assistant:None  Procedure Details  The patient was seen again in the Holding Room. The benefits, complications, treatment options, and expected outcomes were discussed with the patient. The risks of bleeding, infection, recurrence of symptoms, failure to resolve symptoms, bile duct damage, bile duct leak, retained common bile duct stone, bowel injury, any of which could require further surgery and/or ERCP, stent, or papillotomy were reviewed with the patient. The likelihood of improving the patient's symptoms with return to their baseline status is good.  The patient and/or family concurred with the proposed plan, giving informed consent.  The patient was taken to Operating Room, identified as Tiffany Mathews and the procedure verified as robotic assisted Laparoscopic Cholecystectomy.  A Time Out was held and the above information confirmed.  Prior to the induction of general anesthesia, antibiotic prophylaxis was administered. VTE prophylaxis was in place. General endotracheal anesthesia was then administered and tolerated well. After the induction, the abdomen was prepped with Chloraprep and draped in the sterile fashion. The patient was positioned in the supine position.  Local anesthetic  was injected into the skin below the umbilicus and an incision made. The skin was incised with an 11 blade scalpel and using Bovie electrocautery segment of the level of the fascia. The fascia was grasped with a Cook forceps and incised again with the 11 blade scalpel. This showed visualization of the peritoneal cavity and a 11 mm trocar was inserted. Pneumoperitoneum was then created under  direct visualization with CO2 and tolerated well without any adverse changes in the patient's vital signs. Immediately omentum stuck to the upper midline was identified obscuring our view of the liver and upper abdomen. Our left-sided trochars were able to be placed. 10 cm lateral from the umbilicus our initial 5 mm operative trocar was placed. 10 cm lateral to that additional 5 mm assistant trocar was placed. Using accommodation of blunt dissection and electrocautery the midline hernia was able to be reduced without any obvious Location. This then allowed for additional operative trocar placed 10 cm to the right of the umbilicus. All skin incisions  were infiltrated with a local anesthetic agent before making the incision and placing the trocars. The robot was then docked and undocked because the patient was in the incorrect position.  The patient was positioned  in reverse Trendelenburg, tilted slightly to the patient's left.  We then re-docked the [employer] robot and proceeded with the operation. The gallbladder was identified, the fundus grasped and retracted cephalad.   I broke scrub and proceeded to the console. Adhesions were lysed bluntly. The infundibulum was grasped and retracted laterally, exposing the peritoneum overlying the triangle of Calot. This was then divided and exposed in a blunt fashion. A critical view of the cystic duct and cystic artery was obtained.  The cystic duct was clearly identified and bluntly dissected. Our anatomy was verified using a firefighter lysin indigo carmine.  The cystic duct and cystic artery were then serially clipped with hemo-lock clips. There are cut in between with robotic shears.The gallbladder was taken from the gallbladder fossa in a retrograde fashion with the electrocautery. The gallbladder was removed and placed in an Endocatch bag. The liver bed was irrigated and inspected. Hemostasis was achieved with the electrocautery. Copious irrigation was utilized  and was repeatedly aspirated until clear.  The gallbladder and Endocatch sac were then removed through the umbilical port site.   Inspection of the right upper quadrant was performed. No bleeding, bile duct injury or leak, or bowel injury was noted. Pneumoperitoneum was released.  The umbilical port site was closed with figure-of-eight 0 Vicryl sutures. 4-0 subcuticular Monocryl was used to close the skin at all port sites. Steristrips and Mastisol and sterile dressings were  applied.  The patient was then extubated and brought to the recovery room in stable condition. Sponge, lap, and needle counts were correct at closure and at the conclusion of the case.   Findings: Biliary dyskinesia, ventral hernia   Estimated Blood Loss: 10 mL         Drains: None         Specimens: Gallbladder           Complications: none               Anneli Bing T. Adonis Huguenin, MD, FACS

## 2016-05-27 NOTE — Anesthesia Procedure Notes (Signed)
Procedure Name: Intubation Date/Time: 05/27/2016 7:34 AM Performed by: Darlyne Russian Pre-anesthesia Checklist: Patient identified, Emergency Drugs available, Suction available, Patient being monitored and Timeout performed Patient Re-evaluated:Patient Re-evaluated prior to inductionOxygen Delivery Method: Circle system utilized Preoxygenation: Pre-oxygenation with 100% oxygen Intubation Type: IV induction Ventilation: Mask ventilation without difficulty Laryngoscope Size: Mac and 3 Grade View: Grade II Tube type: Oral Tube size: 7.0 mm Number of attempts: 1 Airway Equipment and Method: Stylet Placement Confirmation: ETT inserted through vocal cords under direct vision,  positive ETCO2 and breath sounds checked- equal and bilateral Secured at: 22 cm Tube secured with: Tape Dental Injury: Teeth and Oropharynx as per pre-operative assessment  Comments: Soft biteblock placed between left molars.

## 2016-05-27 NOTE — Discharge Instructions (Signed)
Laparoscopic Cholecystectomy, Care After °This sheet gives you information about how to care for yourself after your procedure. Your health care provider may also give you more specific instructions. If you have problems or questions, contact your health care provider. °What can I expect after the procedure? °After the procedure, it is common to have: °· Pain at your incision sites. You will be given medicines to control this pain. °· Mild nausea or vomiting. °· Bloating and possible shoulder pain from the air-like gas that was used during the procedure. ° °Follow these instructions at home: °Incision care ° °· Follow instructions from your health care provider about how to take care of your incisions. Make sure you: °? Wash your hands with soap and water before you change your bandage (dressing). If soap and water are not available, use hand sanitizer. °? Change your dressing as told by your health care provider. °? Leave stitches (sutures), skin glue, or adhesive strips in place. These skin closures may need to be in place for 2 weeks or longer. If adhesive strip edges start to loosen and curl up, you may trim the loose edges. Do not remove adhesive strips completely unless your health care provider tells you to do that. °· Do not take baths, swim, or use a hot tub until your health care provider approves. Ask your health care provider if you can take showers. You may only be allowed to take sponge baths for bathing. °· Check your incision area every day for signs of infection. Check for: °? More redness, swelling, or pain. °? More fluid or blood. °? Warmth. °? Pus or a bad smell. °Activity °· Do not drive or use heavy machinery while taking prescription pain medicine. °· Do not lift anything that is heavier than 10 lb (4.5 kg) until your health care provider approves. °· Do not play contact sports until your health care provider approves. °· Do not drive for 24 hours if you were given a medicine to help you relax  (sedative). °· Rest as needed. Do not return to work or school until your health care provider approves. °General instructions °· Take over-the-counter and prescription medicines only as told by your health care provider. °· To prevent or treat constipation while you are taking prescription pain medicine, your health care provider may recommend that you: °? Drink enough fluid to keep your urine clear or pale yellow. °? Take over-the-counter or prescription medicines. °? Eat foods that are high in fiber, such as fresh fruits and vegetables, whole grains, and beans. °? Limit foods that are high in fat and processed sugars, such as fried and sweet foods. °Contact a health care provider if: °· You develop a rash. °· You have more redness, swelling, or pain around your incisions. °· You have more fluid or blood coming from your incisions. °· Your incisions feel warm to the touch. °· You have pus or a bad smell coming from your incisions. °· You have a fever. °· One or more of your incisions breaks open. °Get help right away if: °· You have trouble breathing. °· You have chest pain. °· You have increasing pain in your shoulders. °· You faint or feel dizzy when you stand. °· You have severe pain in your abdomen. °· You have nausea or vomiting that lasts for more than one day. °· You have leg pain. °This information is not intended to replace advice given to you by your health care provider. Make sure you discuss any questions you   have with your health care provider. °Document Released: 05/24/2005 Document Revised: 12/13/2015 Document Reviewed: 11/10/2015 °Elsevier Interactive Patient Education © 2017 [employer] °AMBULATORY SURGERY  °DISCHARGE INSTRUCTIONS ° ° °1) The drugs that you were given will stay in your system until tomorrow so for the next 24 hours you should not: ° °A) Drive an automobile °B) Make any legal decisions °C) Drink any alcoholic beverage ° ° °2) You may resume regular meals tomorrow.  Today it is  better to start with liquids and gradually work up to solid foods. ° °You may eat anything you prefer, but it is better to start with liquids, then soup and crackers, and gradually work up to solid foods. ° ° °3) Please notify your doctor immediately if you have any unusual bleeding, trouble breathing, redness and pain at the surgery site, drainage, fever, or pain not relieved by medication. ° ° ° °4) Additional Instructions: ° ° ° ° ° ° ° °Please contact your physician with any problems or Same Day Surgery at [phone], Monday through Friday 6 am to 4 pm, or [facility] number at [phone]. °

## 2016-05-27 NOTE — Transfer of Care (Signed)
Immediate Anesthesia Transfer of Care Note  Patient: Tiffany Mathews  Procedure(s) Performed: Procedure(s): ROBOTIC ASSISTED LAPAROSCOPIC CHOLECYSTECTOMY (N/A) LYSIS OF ADHESION  Patient Location: PACU  Anesthesia Type:General  Level of Consciousness: awake and patient cooperative  Airway & Oxygen Therapy: Patient Spontanous Breathing and Patient connected to nasal cannula oxygen  Post-op Assessment: Report given to RN, Post -op Vital signs reviewed and stable and Patient moving all extremities X 4  Post vital signs: Reviewed and stable  Last Vitals:  Vitals:   05/27/16 0624 05/27/16 0930  BP: 125/84 (!) 151/90  Pulse: 84 85  Resp: 18 11  Temp: 36.4 C 36.4 C    Last Pain:  Vitals:   05/27/16 0624  TempSrc: Oral  PainSc: 3          Complications: No apparent anesthesia complications

## 2016-05-27 NOTE — Anesthesia Postprocedure Evaluation (Signed)
Anesthesia Post Note  Patient: Tiffany Mathews  Procedure(s) Performed: Procedure(s) (LRB): ROBOTIC ASSISTED LAPAROSCOPIC CHOLECYSTECTOMY (N/A) LYSIS OF ADHESION  Patient location during evaluation: PACU Anesthesia Type: General Level of consciousness: awake and alert Pain management: pain level controlled Vital Signs Assessment: post-procedure vital signs reviewed and stable Respiratory status: spontaneous breathing, nonlabored ventilation, respiratory function stable and patient connected to nasal cannula oxygen Cardiovascular status: blood pressure returned to baseline and stable Postop Assessment: no signs of nausea or vomiting Anesthetic complications: no     Last Vitals:  Vitals:   05/27/16 1015 05/27/16 1026  BP: 129/75 127/79  Pulse: 79 74  Resp: 12 16  Temp: 36.6 C     Last Pain:  Vitals:   05/27/16 1026  TempSrc:   PainSc: 8                  Ellie Spickler K Jazzlyn Huizenga

## 2016-05-28 LAB — SURGICAL PATHOLOGY

## 2016-06-03 DIAGNOSIS — F411 Generalized anxiety disorder: Secondary | ICD-10-CM | POA: Diagnosis not present

## 2016-06-03 DIAGNOSIS — F3181 Bipolar II disorder: Secondary | ICD-10-CM | POA: Diagnosis not present

## 2016-06-04 DIAGNOSIS — L300 Nummular dermatitis: Secondary | ICD-10-CM | POA: Diagnosis not present

## 2016-06-04 DIAGNOSIS — D225 Melanocytic nevi of trunk: Secondary | ICD-10-CM | POA: Diagnosis not present

## 2016-06-04 DIAGNOSIS — L732 Hidradenitis suppurativa: Secondary | ICD-10-CM | POA: Diagnosis not present

## 2016-06-04 DIAGNOSIS — D2271 Melanocytic nevi of right lower limb, including hip: Secondary | ICD-10-CM | POA: Diagnosis not present

## 2016-06-14 ENCOUNTER — Ambulatory Visit: Payer: Self-pay | Admitting: General Surgery

## 2016-06-15 ENCOUNTER — Encounter: Payer: Self-pay | Admitting: General Surgery

## 2016-06-15 ENCOUNTER — Other Ambulatory Visit: Payer: Self-pay

## 2016-06-15 ENCOUNTER — Ambulatory Visit (INDEPENDENT_AMBULATORY_CARE_PROVIDER_SITE_OTHER): Payer: Medicare Other | Admitting: General Surgery

## 2016-06-15 VITALS — BP 147/83 | HR 111 | Temp 98.3°F | Ht 65.0 in | Wt 194.2 lb

## 2016-06-15 DIAGNOSIS — Z4889 Encounter for other specified surgical aftercare: Secondary | ICD-10-CM

## 2016-06-15 NOTE — Patient Instructions (Signed)
Please call our office with any questions or concerns.  Please do not submerge in a tub, hot tub, or pool until incisions are completely sealed.  Use sun block to incision area over the next year if this area will be exposed to sun. This helps decrease scarring.  You may resume your normal activities on 06/15/2016. At that time- Listen to your body when lifting, if you have pain when lifting, stop and then try again in a few days. Pain after doing exercises or activities of daily living is normal as you get back in to your normal routine.  If you develop redness, drainage, or pain at incision sites- call our office immediately and speak with a nurse.

## 2016-06-15 NOTE — Progress Notes (Signed)
Outpatient Surgical Follow Up  06/15/2016  Tiffany Mathews is an 56 y.o. female.   Chief Complaint  Patient presents with  . Routine Post Op    Robotic Assisted Laparoscopic Cholecystectomy -05/27/16-Dr.Daylen Lipsky    HPI: 56 year old female returns to clinic 2 weeks status post robotic-assisted laparoscopic cholecystectomy. Patient reports doing very well. She denies any pain. She has been eating well but knows that she still much she will have abdominal discomfort. She denies any fevers, chills, nausea, vomiting, diarrhea, constipation. She's been very happy with her surgical clearance.  Past Medical History:  Diagnosis Date  . Arthritis   . Bipolar 1 disorder ([place])   . Fibromyalgia   . GERD (gastroesophageal reflux disease)   . Hyperlipidemia   . Hypertension   . Sleep apnea     Past Surgical History:  Procedure Laterality Date  . ABLATION  04/2011  . BLADDER REPAIR W/ CESAREAN SECTION  [id number]  . BREAST BIOPSY Right 2009   -  . CESAREAN SECTION    . LYSIS OF ADHESION  05/27/2016   Procedure: LYSIS OF ADHESION;  Surgeon: Clayburn Pert, MD;  Location: [facility] ORS;  Service: General;;  . ROBOTIC ASSISTED LAPAROSCOPIC CHOLECYSTECTOMY N/A 05/27/2016   Procedure: ROBOTIC ASSISTED LAPAROSCOPIC CHOLECYSTECTOMY;  Surgeon: Clayburn Pert, MD;  Location: [facility] ORS;  Service: General;  Laterality: N/A;  . SHOULDER ARTHROSCOPY  2000  . TUBAL LIGATION      Family History  Problem Relation Age of Onset  . Thyroid cancer Mother   . Hypertension Mother   . Diabetes Mother   . Breast cancer Maternal Aunt 77  . Alzheimer's disease Father   . Stroke Father   . Stomach cancer Sister   . Diabetes Brother   . Hypertension Brother   . Heart attack Maternal Grandfather   . Diabetes Brother   . Hypertension Brother   . Alcohol abuse Brother   . Schizophrenia Brother     Social History:  reports that she quit smoking about 2 years ago. She has a 20.00 pack-year smoking history. She  has never used smokeless tobacco. She reports that she does not drink alcohol or use drugs.  Allergies:  Allergies  Allergen Reactions  . Lamictal [Lamotrigine] Rash and Other (See Comments)  . Depakote [Divalproex Sodium] Other (See Comments)    Severe hair loss  . Lithium Other (See Comments)    Unknown; "It just made me feel bad."    Medications reviewed.    ROS A multipoint review of systems was completed, all pertinent positives and negatives are documented within the history of present illness and remainder are negative.   BP (!) 147/83   Pulse (!) 111   Temp 98.3 F (36.8 C) (Oral)   Ht 5\' 5"  (1.651 m)   Wt 88.1 kg (194 lb 3.2 oz)   BMI 32.32 kg/m   Physical Exam Gen.: No acute distress Chest: Clear to auscultation Heart: Tachycardic Abdomen: Soft, nontender, nondistended. No palpable evidence of hernia or masses. Well approximated laparoscopic incision sites without any evidence of erythema or drainage.    No results found for this or any previous visit (from the past 48 hour(s)). No results found.  Assessment/Plan:  1. Aftercare following surgery 56 year old female status post robotic assisted laparoscopic cholecystectomy. Doing very well. Pathology reviewed and operative findings of fat containing ventral hernia that was reduced were again discussed. Provided with standard postoperative precautions and instructions. All questions answered to her satisfaction and she will follow-up in clinic on an  as-needed basis.     Clayburn Pert, MD FACS General Surgeon  06/15/2016,12:23 PM

## 2016-06-16 DIAGNOSIS — F3132 Bipolar disorder, current episode depressed, moderate: Secondary | ICD-10-CM | POA: Diagnosis not present

## 2016-06-16 DIAGNOSIS — F4001 Agoraphobia with panic disorder: Secondary | ICD-10-CM | POA: Diagnosis not present

## 2016-06-21 ENCOUNTER — Other Ambulatory Visit: Payer: Self-pay | Admitting: Internal Medicine

## 2016-07-20 ENCOUNTER — Other Ambulatory Visit: Payer: Self-pay | Admitting: Internal Medicine

## 2016-07-20 DIAGNOSIS — M7550 Bursitis of unspecified shoulder: Secondary | ICD-10-CM | POA: Diagnosis not present

## 2016-07-20 DIAGNOSIS — J45991 Cough variant asthma: Secondary | ICD-10-CM | POA: Diagnosis not present

## 2016-07-20 DIAGNOSIS — L732 Hidradenitis suppurativa: Secondary | ICD-10-CM | POA: Diagnosis not present

## 2016-07-20 DIAGNOSIS — Z1231 Encounter for screening mammogram for malignant neoplasm of breast: Secondary | ICD-10-CM

## 2016-07-20 DIAGNOSIS — M75100 Unspecified rotator cuff tear or rupture of unspecified shoulder, not specified as traumatic: Secondary | ICD-10-CM | POA: Diagnosis not present

## 2016-07-22 DIAGNOSIS — R5381 Other malaise: Secondary | ICD-10-CM | POA: Diagnosis not present

## 2016-07-22 DIAGNOSIS — E784 Other hyperlipidemia: Secondary | ICD-10-CM | POA: Diagnosis not present

## 2016-07-22 DIAGNOSIS — I10 Essential (primary) hypertension: Secondary | ICD-10-CM | POA: Diagnosis not present

## 2016-07-23 DIAGNOSIS — F603 Borderline personality disorder: Secondary | ICD-10-CM | POA: Diagnosis not present

## 2016-07-23 DIAGNOSIS — F3181 Bipolar II disorder: Secondary | ICD-10-CM | POA: Diagnosis not present

## 2016-07-23 DIAGNOSIS — F411 Generalized anxiety disorder: Secondary | ICD-10-CM | POA: Diagnosis not present

## 2016-08-19 DIAGNOSIS — F411 Generalized anxiety disorder: Secondary | ICD-10-CM | POA: Diagnosis not present

## 2016-08-19 DIAGNOSIS — F3181 Bipolar II disorder: Secondary | ICD-10-CM | POA: Diagnosis not present

## 2016-08-19 DIAGNOSIS — F900 Attention-deficit hyperactivity disorder, predominantly inattentive type: Secondary | ICD-10-CM | POA: Diagnosis not present

## 2016-08-20 ENCOUNTER — Ambulatory Visit
Admission: RE | Admit: 2016-08-20 | Discharge: 2016-08-20 | Disposition: A | Discharge status: Home / Self Care | Payer: Medicare Other | Source: Ambulatory Visit | Attending: Internal Medicine | Admitting: Internal Medicine

## 2016-08-20 DIAGNOSIS — Z1231 Encounter for screening mammogram for malignant neoplasm of breast: Secondary | ICD-10-CM | POA: Diagnosis not present

## 2016-08-30 DIAGNOSIS — L732 Hidradenitis suppurativa: Secondary | ICD-10-CM | POA: Diagnosis not present

## 2016-08-30 DIAGNOSIS — E784 Other hyperlipidemia: Secondary | ICD-10-CM | POA: Diagnosis not present

## 2016-08-30 DIAGNOSIS — M7550 Bursitis of unspecified shoulder: Secondary | ICD-10-CM | POA: Diagnosis not present

## 2016-08-30 DIAGNOSIS — M75100 Unspecified rotator cuff tear or rupture of unspecified shoulder, not specified as traumatic: Secondary | ICD-10-CM | POA: Diagnosis not present

## 2016-09-14 DIAGNOSIS — L732 Hidradenitis suppurativa: Secondary | ICD-10-CM | POA: Diagnosis not present

## 2016-09-14 DIAGNOSIS — M7550 Bursitis of unspecified shoulder: Secondary | ICD-10-CM | POA: Diagnosis not present

## 2016-09-14 DIAGNOSIS — M75100 Unspecified rotator cuff tear or rupture of unspecified shoulder, not specified as traumatic: Secondary | ICD-10-CM | POA: Diagnosis not present

## 2016-09-14 DIAGNOSIS — J45991 Cough variant asthma: Secondary | ICD-10-CM | POA: Diagnosis not present

## 2016-09-23 ENCOUNTER — Other Ambulatory Visit: Payer: Self-pay

## 2016-09-23 ENCOUNTER — Telehealth: Payer: Self-pay | Admitting: Gastroenterology

## 2016-09-23 MED ORDER — PANTOPRAZOLE SODIUM 40 MG PO TBEC: 40.0000 mg | DELAYED_RELEASE_TABLET | Freq: Every day | ORAL | 3 refills | Status: DC

## 2016-09-23 NOTE — Telephone Encounter (Signed)
Rx refill for Pantoprazole sent to pt's pharmacy.

## 2016-09-23 NOTE — Telephone Encounter (Signed)
*  STAT* If patient is at the pharmacy, call can be transferred to refill team.   1. Which medications need to be refilled? (please list name of each medication and dose if known) pantoprazole (PROTONIX) 40 MG tablet  2. Which pharmacy/location (including street and city if local pharmacy) is medication to be sent to? [facility] in [place]  3. Do they need a 30 day or 90 day supply? 90 day

## 2016-10-07 DIAGNOSIS — E041 Nontoxic single thyroid nodule: Secondary | ICD-10-CM | POA: Diagnosis not present

## 2016-10-14 DIAGNOSIS — F411 Generalized anxiety disorder: Secondary | ICD-10-CM | POA: Diagnosis not present

## 2016-10-14 DIAGNOSIS — F900 Attention-deficit hyperactivity disorder, predominantly inattentive type: Secondary | ICD-10-CM | POA: Diagnosis not present

## 2016-10-14 DIAGNOSIS — F3181 Bipolar II disorder: Secondary | ICD-10-CM | POA: Diagnosis not present

## 2016-10-15 DIAGNOSIS — E042 Nontoxic multinodular goiter: Secondary | ICD-10-CM | POA: Diagnosis not present

## 2016-10-19 DIAGNOSIS — L732 Hidradenitis suppurativa: Secondary | ICD-10-CM | POA: Diagnosis not present

## 2016-10-19 DIAGNOSIS — M7590 Shoulder lesion, unspecified, unspecified shoulder: Secondary | ICD-10-CM | POA: Diagnosis not present

## 2016-10-19 DIAGNOSIS — M75100 Unspecified rotator cuff tear or rupture of unspecified shoulder, not specified as traumatic: Secondary | ICD-10-CM | POA: Diagnosis not present

## 2016-10-19 DIAGNOSIS — J45991 Cough variant asthma: Secondary | ICD-10-CM | POA: Diagnosis not present

## 2016-11-04 ENCOUNTER — Other Ambulatory Visit: Payer: Self-pay | Admitting: Obstetrics and Gynecology

## 2016-11-04 DIAGNOSIS — R102 Pelvic and perineal pain: Secondary | ICD-10-CM

## 2016-11-04 DIAGNOSIS — E669 Obesity, unspecified: Secondary | ICD-10-CM | POA: Diagnosis not present

## 2016-11-04 DIAGNOSIS — Z01419 Encounter for gynecological examination (general) (routine) without abnormal findings: Secondary | ICD-10-CM | POA: Diagnosis not present

## 2016-11-04 DIAGNOSIS — Z1211 Encounter for screening for malignant neoplasm of colon: Secondary | ICD-10-CM | POA: Diagnosis not present

## 2016-11-04 DIAGNOSIS — Z124 Encounter for screening for malignant neoplasm of cervix: Secondary | ICD-10-CM | POA: Diagnosis not present

## 2016-11-25 ENCOUNTER — Ambulatory Visit
Admission: RE | Admit: 2016-11-25 | Discharge: 2016-11-25 | Disposition: A | Discharge status: Home / Self Care | Payer: Medicare Other | Source: Ambulatory Visit | Attending: Obstetrics and Gynecology | Admitting: Obstetrics and Gynecology

## 2016-11-25 DIAGNOSIS — R102 Pelvic and perineal pain: Secondary | ICD-10-CM | POA: Insufficient documentation

## 2016-11-25 DIAGNOSIS — K760 Fatty (change of) liver, not elsewhere classified: Secondary | ICD-10-CM | POA: Diagnosis not present

## 2016-11-25 DIAGNOSIS — I700 Atherosclerosis of aorta: Secondary | ICD-10-CM | POA: Diagnosis not present

## 2016-11-25 DIAGNOSIS — R160 Hepatomegaly, not elsewhere classified: Secondary | ICD-10-CM | POA: Diagnosis not present

## 2016-11-25 MED ORDER — IOPAMIDOL (ISOVUE-300) INJECTION 61%
100.0000 mL | Freq: Once | INTRAVENOUS | Status: AC | PRN
  Administered 2016-11-25: 100 mL via INTRAVENOUS

## 2016-12-07 DIAGNOSIS — R102 Pelvic and perineal pain: Secondary | ICD-10-CM | POA: Diagnosis not present

## 2016-12-21 DIAGNOSIS — Z5181 Encounter for therapeutic drug level monitoring: Secondary | ICD-10-CM | POA: Diagnosis not present

## 2016-12-21 DIAGNOSIS — F900 Attention-deficit hyperactivity disorder, predominantly inattentive type: Secondary | ICD-10-CM | POA: Diagnosis not present

## 2016-12-21 DIAGNOSIS — F411 Generalized anxiety disorder: Secondary | ICD-10-CM | POA: Diagnosis not present

## 2016-12-21 DIAGNOSIS — Z79899 Other long term (current) drug therapy: Secondary | ICD-10-CM | POA: Diagnosis not present

## 2016-12-21 DIAGNOSIS — F319 Bipolar disorder, unspecified: Secondary | ICD-10-CM | POA: Diagnosis not present

## 2017-01-24 DIAGNOSIS — F900 Attention-deficit hyperactivity disorder, predominantly inattentive type: Secondary | ICD-10-CM | POA: Diagnosis not present

## 2017-01-24 DIAGNOSIS — F319 Bipolar disorder, unspecified: Secondary | ICD-10-CM | POA: Diagnosis not present

## 2017-02-03 ENCOUNTER — Ambulatory Visit: Payer: Medicare Other | Admitting: Gastroenterology

## 2017-03-14 ENCOUNTER — Ambulatory Visit: Payer: Medicare Other | Admitting: Gastroenterology

## 2017-03-21 DIAGNOSIS — F900 Attention-deficit hyperactivity disorder, predominantly inattentive type: Secondary | ICD-10-CM | POA: Diagnosis not present

## 2017-03-21 DIAGNOSIS — F319 Bipolar disorder, unspecified: Secondary | ICD-10-CM | POA: Diagnosis not present

## 2017-04-26 DIAGNOSIS — F902 Attention-deficit hyperactivity disorder, combined type: Secondary | ICD-10-CM | POA: Diagnosis not present

## 2017-04-26 DIAGNOSIS — F319 Bipolar disorder, unspecified: Secondary | ICD-10-CM | POA: Diagnosis not present

## 2017-05-24 DIAGNOSIS — F902 Attention-deficit hyperactivity disorder, combined type: Secondary | ICD-10-CM | POA: Diagnosis not present

## 2017-05-24 DIAGNOSIS — F319 Bipolar disorder, unspecified: Secondary | ICD-10-CM | POA: Diagnosis not present

## 2017-05-25 DIAGNOSIS — J45991 Cough variant asthma: Secondary | ICD-10-CM | POA: Diagnosis not present

## 2017-05-25 DIAGNOSIS — M75100 Unspecified rotator cuff tear or rupture of unspecified shoulder, not specified as traumatic: Secondary | ICD-10-CM | POA: Diagnosis not present

## 2017-05-25 DIAGNOSIS — L732 Hidradenitis suppurativa: Secondary | ICD-10-CM | POA: Diagnosis not present

## 2017-05-25 DIAGNOSIS — J209 Acute bronchitis, unspecified: Secondary | ICD-10-CM | POA: Diagnosis not present

## 2017-09-03 ENCOUNTER — Other Ambulatory Visit: Payer: Self-pay | Admitting: Gastroenterology

## 2017-09-05 ENCOUNTER — Other Ambulatory Visit: Payer: Self-pay

## 2017-09-05 ENCOUNTER — Telehealth: Payer: Self-pay | Admitting: Gastroenterology

## 2017-09-05 MED ORDER — PANTOPRAZOLE SODIUM 40 MG PO TBEC: 40.0000 mg | DELAYED_RELEASE_TABLET | Freq: Every day | ORAL | 1 refills | Status: DC

## 2017-09-05 NOTE — Telephone Encounter (Signed)
Pt is calling to get refill on rx pencozole send to [facility] [address] she set up for apt for 10/19/17

## 2017-09-05 NOTE — Telephone Encounter (Signed)
Pantoprazole 40mg  has been sent to pt's pharmacy. Pt given enough to cover until follow up appt.

## 2017-10-13 ENCOUNTER — Emergency Department
Admission: EM | Admit: 2017-10-13 | Discharge: 2017-10-13 | Disposition: A | Discharge status: Home / Self Care | Payer: Medicare Other | Attending: Emergency Medicine | Admitting: Emergency Medicine

## 2017-10-13 ENCOUNTER — Encounter: Payer: Self-pay | Admitting: Emergency Medicine

## 2017-10-13 ENCOUNTER — Other Ambulatory Visit: Payer: Self-pay

## 2017-10-13 ENCOUNTER — Emergency Department: Payer: Medicare Other

## 2017-10-13 DIAGNOSIS — F17210 Nicotine dependence, cigarettes, uncomplicated: Secondary | ICD-10-CM | POA: Diagnosis not present

## 2017-10-13 DIAGNOSIS — Y92481 Parking lot as the place of occurrence of the external cause: Secondary | ICD-10-CM | POA: Diagnosis not present

## 2017-10-13 DIAGNOSIS — Y999 Unspecified external cause status: Secondary | ICD-10-CM | POA: Diagnosis not present

## 2017-10-13 DIAGNOSIS — X501XXA Overexertion from prolonged static or awkward postures, initial encounter: Secondary | ICD-10-CM | POA: Diagnosis not present

## 2017-10-13 DIAGNOSIS — W1842XA Slipping, tripping and stumbling without falling due to stepping into hole or opening, initial encounter: Secondary | ICD-10-CM | POA: Insufficient documentation

## 2017-10-13 DIAGNOSIS — Y939 Activity, unspecified: Secondary | ICD-10-CM | POA: Diagnosis not present

## 2017-10-13 DIAGNOSIS — S93402A Sprain of unspecified ligament of left ankle, initial encounter: Secondary | ICD-10-CM | POA: Diagnosis not present

## 2017-10-13 DIAGNOSIS — I10 Essential (primary) hypertension: Secondary | ICD-10-CM | POA: Diagnosis not present

## 2017-10-13 DIAGNOSIS — S99912A Unspecified injury of left ankle, initial encounter: Secondary | ICD-10-CM | POA: Diagnosis present

## 2017-10-13 MED ORDER — MELOXICAM 15 MG PO TABS: 15.0000 mg | ORAL_TABLET | Freq: Every day | ORAL | 2 refills | Status: DC

## 2017-10-13 NOTE — ED Notes (Signed)
First Nurse Note:  Patient to ED via [facility] after fall yesterday at 1500 after tripping in a hole.  Complaining of left ankle pain, EMS states there is some edema, VSS 160/90.

## 2017-10-13 NOTE — ED Notes (Signed)
See triage note  States she stepped home yesterday  Twisted left ankle ,swelling noted   Good pulses  States she is unable to bear wt  Also has abrasion to right knee

## 2017-10-13 NOTE — ED Provider Notes (Signed)
[facility] Emergency Department Provider Note  ____________________________________________   First MD Initiated Contact with Patient 10/13/17 1047     (approximate)  I have reviewed the triage vital signs and the nursing notes.   HISTORY  Chief Complaint Ankle Pain and Knee Pain    HPI Tiffany Mathews is a 57 y.o. female complains of left ankle pain.  She states she stepped in a hole in a parking lot and twisted her ankle yesterday.  She states she cannot bear weight on the left ankle without pain.  She denies any numbness or tingling.  She denies any other injuries at this time.  Past Medical History:  Diagnosis Date  . Arthritis   . Bipolar 1 disorder ([place])   . Fibromyalgia   . GERD (gastroesophageal reflux disease)   . Hyperlipidemia   . Hypertension   . Sleep apnea     Patient Active Problem List   Diagnosis Date Noted  . Biliary dyskinesia 04/08/2016  . Bipolar affective disorder ([place]) 02/23/2016  . Clinical depression 02/23/2016  . Diverticulitis 02/23/2016  . 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 Dysthymia 02/23/2016  . Arthritis, degenerative 02/23/2016  . Adnexal pain 02/23/2016  . HPV test positive 02/23/2016  . Thyroid nodule 02/23/2016  . Calculus (=stone) 02/23/2016  . 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 Myalgia 12/25/2015  . Polyarthralgia 12/25/2015  . Panic disorder with agoraphobia and moderate panic attacks 12/12/2014  . Bipolar I disorder, most recent episode mixed ([place]) 12/12/2014    Past Surgical History:  Procedure Laterality Date  . ABLATION  04/2011  . BLADDER REPAIR W/ CESAREAN SECTION  [id number]  . BREAST BIOPSY Right 2009   -  . CESAREAN SECTION    . LYSIS OF ADHESION  05/27/2016   Procedure: LYSIS OF ADHESION;  Surgeon: Clayburn Pert, MD;  Location: [facility] ORS;  Service: General;;  . ROBOTIC ASSISTED LAPAROSCOPIC CHOLECYSTECTOMY N/A 05/27/2016   Procedure: ROBOTIC ASSISTED LAPAROSCOPIC  CHOLECYSTECTOMY;  Surgeon: Clayburn Pert, MD;  Location: [facility] ORS;  Service: General;  Laterality: N/A;  . SHOULDER ARTHROSCOPY  2000  . TUBAL LIGATION      Prior to Admission medications   Medication Sig Start Date End Date Taking? Authorizing Provider  ARIPiprazole (ABILIFY) 5 MG tablet Take 5 mg by mouth daily.    [provider]  aspirin EC 81 MG tablet Take by mouth.    [provider]  atorvastatin (LIPITOR) 40 MG tablet Take 40 mg by mouth daily.     [provider]  azelastine (ASTELIN) 0.1 % nasal spray Place 1 spray into both nostrils 2 (two) times daily. Use in each nostril as directed    [provider]  clonazePAM (KLONOPIN) 0.5 MG tablet Take 0.5 mg by mouth 2 (two) times daily.     [provider]  cyclobenzaprine (FLEXERIL) 10 MG tablet Take 10 mg by mouth at bedtime.     [provider]  fluticasone (FLONASE) 50 MCG/ACT nasal spray Place 1 spray into both nostrils daily.  12/09/14   [provider]  gabapentin (NEURONTIN) 100 MG capsule Take 100 mg by mouth 3 (three) times daily as needed.  12/04/15   [provider]  losartan (COZAAR) 100 MG tablet Take 100 mg by mouth daily.     [provider]  meloxicam (MOBIC) 15 MG tablet Take 1 tablet (15 mg total) by mouth daily. 10/13/17 10/13/18  Fisher, Linden Dolin, PA-C  Multiple Vitamins-Minerals (MULTIVITAMIN WITH MINERALS) tablet Take 1 tablet  by mouth daily.    [provider]  pantoprazole (PROTONIX) 40 MG tablet Take 1 tablet (40 mg total) by mouth daily. 09/05/17   Lucilla Lame, MD  sertraline (ZOLOFT) 100 MG tablet Take 200 mg by mouth daily.     [provider]  triamcinolone cream (KENALOG) 0.1 %  06/04/16   [provider]    Allergies Lamictal [lamotrigine]; Depakote [divalproex sodium]; and Lithium  Family History  Problem Relation Age of Onset  . Thyroid cancer Mother   . Hypertension Mother   . Diabetes Mother   .  Breast cancer Maternal Aunt 73  . Alzheimer's disease Father   . Stroke Father   . Stomach cancer Sister   . Diabetes Brother   . Hypertension Brother   . Heart attack Maternal Grandfather   . Diabetes Brother   . Hypertension Brother   . Alcohol abuse Brother   . Schizophrenia Brother     Social History Social History   Tobacco Use  . Smoking status: Current Every Day Smoker    Packs/day: 1.00    Years: 20.00    Pack years: 20.00    Last attempt to quit: 2016    Years since quitting: 3.3  . Smokeless tobacco: Never Used  Substance Use Topics  . Alcohol use: No  . Drug use: No    Review of Systems  Constitutional: No fever/chills Eyes: No visual changes. ENT: No sore throat. Respiratory: Denies cough Genitourinary: Negative for dysuria. Musculoskeletal: Negative for back pain.  Positive for left ankle pain and swelling Skin: Negative for rash.    ____________________________________________   PHYSICAL EXAM:  VITAL SIGNS: ED Triage Vitals  Enc Vitals Group     BP 10/13/17 1035 127/70     Pulse Rate 10/13/17 1035 84     Resp 10/13/17 1035 16     Temp 10/13/17 1035 98.2 F (36.8 C)     Temp Source 10/13/17 1035 Oral     SpO2 10/13/17 1035 99 %     Weight 10/13/17 1036 195 lb (88.5 kg)     Height 10/13/17 1036 5\' 5"  (1.651 m)     Head Circumference --      Peak Flow --      Pain Score 10/13/17 1036 10     Pain Loc --      Pain Edu? --      Excl. in [place]? --     Constitutional: Alert and oriented. Well appearing and in no acute distress. Eyes: Conjunctivae are normal.  Head: Atraumatic. Nose: No congestion/rhinnorhea. Mouth/Throat: Mucous membranes are moist.   Cardiovascular: Normal rate, regular rhythm.  Heart sounds are normal Respiratory: Normal respiratory effort.  No retractions, lungs clear to auscultation GU: deferred Musculoskeletal: FROM all extremities, warm and well perfused.  The left ankle is grossly swollen on the lateral and the  medial malleolus.  The same area is tender to palpation.  The left foot is not tender.  The knee is not tender.  She is neurovascularly intact Neurologic:  Normal speech and language.  Skin:  Skin is warm, dry and intact. No rash noted. Psychiatric: Mood and affect are normal. Speech and behavior are normal.  ____________________________________________   LABS (all labs ordered are listed, but only abnormal results are displayed)  Labs Reviewed - No data to display ____________________________________________   ____________________________________________  RADIOLOGY  X-ray of the left ankle is negative for any acute bony abnormality  ____________________________________________   PROCEDURES  Procedure(s) performed:  Stirrup splint and a walker were given to the patient  Procedures    ____________________________________________   INITIAL IMPRESSION / ASSESSMENT AND PLAN / ED COURSE  Pertinent labs & imaging results that were available during my care of the patient were reviewed by me and considered in my medical decision making (see chart for details).  Patient is 57 year old female presents emergency department complaining of left ankle pain after stepping in a hole and twisting the ankle.  She noted a lot of swelling in his been unable to bear weight without pain.  Physical exam the left ankle is swollen and tender on the medial and lateral malleolus.  She is able to move the ankle and her foot.  She is neurovascularly intact  X-ray of the left ankle is negative for an acute fracture.  She does have a lot of soft tissue swelling.  Explained the findings to the patient.  She was placed in a stirrup splint.  She was offered crutches but the patient states she does not think she can handle crutches and would like a walker.  She was given a walker.  She was given a prescription for meloxicam 15 mg daily.  She is to take this medication as prescribed.  Elevate and ice the  ankle.  She is to follow-up with orthopedics if not better in 7 to 10 days.  Or she may see her regular doctor.  She states she understands will comply with our instructions.  She is discharged in stable condition     As part of my medical decision making, I reviewed the following data within the [place] notes reviewed and incorporated, Old chart reviewed, Radiograph reviewed x-ray left ankle is negative for any acute fractures, Notes from prior ED visits and [place] Controlled Substance Database  ____________________________________________   FINAL CLINICAL IMPRESSION(S) / ED DIAGNOSES  Final diagnoses:  Sprain of left ankle, unspecified ligament, initial encounter      NEW MEDICATIONS STARTED DURING THIS VISIT:  Discharge Medication List as of 10/13/2017 11:42 AM    START taking these medications   Details  meloxicam (MOBIC) 15 MG tablet Take 1 tablet (15 mg total) by mouth daily., Starting Thu 10/13/2017, Until Fri 10/13/2018, Print         Note:  This document was prepared using Dragon voice recognition software and may include unintentional dictation errors.    Versie Starks, PA-C 10/13/17 1809    Eula Listen, MD 10/14/17 [phone]

## 2017-10-13 NOTE — ED Notes (Signed)
First Nurse Note:  Patient also complaining of bilat knee pain, abrasion noted right knee.

## 2017-10-13 NOTE — ED Triage Notes (Signed)
Patient to ED via [facility] with complaint of left ankle pain.  States she stepped in a hole in a parking lot yesterday and cannot bear weight on her left foot.  Left ankle swollen, + DP pulse, can feel me touch her, can wiggle toes.  States that ice pack hurts, so I did not offer her one.

## 2017-10-13 NOTE — Discharge Instructions (Addendum)
Follow-up with your regular doctor if not better in 3 to 5 days.  If you are not better in 7 to 10 days use the orthopedic doctor whose phone number has been provided for you.  Keep ice on the ankle.  Take the meloxicam as prescribed.  Use the ankle brace for additional support.  You have also been given crutches.  Use these if it hurts to bear weight on the foot.

## 2017-10-18 ENCOUNTER — Other Ambulatory Visit: Payer: Self-pay

## 2017-10-18 NOTE — Progress Notes (Signed)
Primary Care Physician: Cletis Athens, MD  Primary Gastroenterologist:  Dr. Lucilla Lame  No chief complaint on file.   HPI: Tiffany Mathews is a 57 y.o. female here for follow-up after not seeing me since September 2017.  The patient had been taking Protonix for her reflux and had called for a refill.  The patient was given a refill to hold her over until this appointment. Patient has been taking pantoprazole 40 mg once a day. The patient was started on pantoprazole for epigastric pain and a history of gastritis on an upper endoscopy.  Current Outpatient Medications  Medication Sig Dispense Refill  . ARIPiprazole (ABILIFY) 5 MG tablet Take 5 mg by mouth daily.    Marland Kitchen aspirin EC 81 MG tablet Take by mouth.    Marland Kitchen atorvastatin (LIPITOR) 40 MG tablet Take 40 mg by mouth daily.     Marland Kitchen azelastine (ASTELIN) 0.1 % nasal spray Place 1 spray into both nostrils 2 (two) times daily. Use in each nostril as directed    . clonazePAM (KLONOPIN) 0.5 MG tablet Take 0.5 mg by mouth 2 (two) times daily.     . cyclobenzaprine (FLEXERIL) 10 MG tablet Take 10 mg by mouth at bedtime.     . DULoxetine HCl 40 MG CPEP     . fluticasone (FLONASE) 50 MCG/ACT nasal spray Place 1 spray into both nostrils daily.     Marland Kitchen gabapentin (NEURONTIN) 100 MG capsule Take 100 mg by mouth 3 (three) times daily as needed.     . gabapentin (NEURONTIN) 300 MG capsule   1  . losartan (COZAAR) 100 MG tablet Take 100 mg by mouth daily.     . meloxicam (MOBIC) 15 MG tablet Take 1 tablet (15 mg total) by mouth daily. 30 tablet 2  . minocycline (MINOCIN,DYNACIN) 100 MG capsule   4  . Multiple Vitamins-Minerals (MULTIVITAMIN WITH MINERALS) tablet Take 1 tablet by mouth daily.    . pantoprazole (PROTONIX) 40 MG tablet Take 1 tablet (40 mg total) by mouth daily. 30 tablet 1  . sertraline (ZOLOFT) 100 MG tablet Take 200 mg by mouth daily.     . traZODone (DESYREL) 100 MG tablet   1  . traZODone (DESYREL) 50 MG tablet Take by mouth.    .  triamcinolone cream (KENALOG) 0.1 %     . ziprasidone (GEODON) 40 MG capsule      No current facility-administered medications for this visit.     Allergies as of 10/19/2017 - Review Complete 11/25/2016  Allergen Reaction Noted  . Lamictal [lamotrigine] Rash and Other (See Comments) 03/20/2014  . Depakote [divalproex sodium] Other (See Comments) 03/20/2014  . Lithium Other (See Comments) 03/20/2014    ROS:  General: Negative for anorexia, weight loss, fever, chills, fatigue, weakness. ENT: Negative for hoarseness, difficulty swallowing , nasal congestion. CV: Negative for chest pain, angina, palpitations, dyspnea on exertion, peripheral edema.  Respiratory: Negative for dyspnea at rest, dyspnea on exertion, cough, sputum, wheezing.  GI: See history of present illness. GU:  Negative for dysuria, hematuria, urinary incontinence, urinary frequency, nocturnal urination.  Endo: Negative for unusual weight change.    Physical Examination:   There were no vitals taken for this visit.  General: Well-nourished, well-developed in no acute distress.  Eyes: No icterus. Conjunctivae pink. Mouth: Oropharyngeal mucosa moist and pink , no lesions erythema or exudate. Lungs: Clear to auscultation bilaterally. Non-labored. Heart: Regular rate and rhythm, no murmurs rubs or gallops.  Abdomen: Bowel sounds are normal,  nontender, nondistended, no hepatosplenomegaly or masses, no abdominal bruits or hernia , no rebound or guarding.   Extremities: No lower extremity edema. No clubbing or deformities. Neuro: Alert and oriented x 3.  Grossly intact. Skin: Warm and dry, no jaundice.   Psych: Alert and cooperative, normal mood and affect.  Labs:    Imaging Studies: Dg Ankle Complete Left  Result Date: 10/13/2017 CLINICAL DATA:  Left ankle pain after twisting injury a STIR day. EXAM: LEFT ANKLE COMPLETE - 3+ VIEW COMPARISON:  None. FINDINGS: No acute fracture or dislocation. The ankle mortise is  symmetric. The talar dome is intact. Moderate tibiotalar joint effusion. Joint spaces are preserved. Large plantar enthesophyte. Bone mineralization is normal. Extensive soft tissue swelling about the ankle, greater over the lateral malleolus. IMPRESSION: Extensive soft tissue swelling about the ankle. No acute osseous abnormality. Electronically Signed   By: Titus Dubin M.D.   On: 10/13/2017 11:36    Assessment and Plan:   Tiffany Mathews is a 57 y.o. y/o female who comes in today without any symptoms of heartburn or acid breakthrough.  The patient states she has been doing well and just needs a refill on her medications.  There is no report of any abdominal pain unexplained weight loss fevers chills nausea vomiting or change in bowel habits.  The patient is not due for her next colonoscopy until next year. She will have her prescriptions refilled today.  The patient will follow up at that time.    Lucilla Lame, MD. [name]   Note: This dictation was prepared with Dragon dictation along with smaller phrase technology. Any transcriptional errors that result from this process are unintentional.

## 2017-10-19 ENCOUNTER — Ambulatory Visit (INDEPENDENT_AMBULATORY_CARE_PROVIDER_SITE_OTHER): Payer: Medicare Other | Admitting: Gastroenterology

## 2017-10-19 ENCOUNTER — Encounter: Payer: Self-pay | Admitting: Gastroenterology

## 2017-10-19 VITALS — BP 133/80 | HR 88 | Ht 65.0 in | Wt 200.0 lb

## 2017-10-19 DIAGNOSIS — K219 Gastro-esophageal reflux disease without esophagitis: Secondary | ICD-10-CM

## 2017-10-19 MED ORDER — PANTOPRAZOLE SODIUM 40 MG PO TBEC: 40.0000 mg | DELAYED_RELEASE_TABLET | Freq: Every day | ORAL | 3 refills | Status: DC

## 2018-01-24 ENCOUNTER — Other Ambulatory Visit: Payer: Self-pay | Admitting: Internal Medicine

## 2018-01-24 DIAGNOSIS — Z1231 Encounter for screening mammogram for malignant neoplasm of breast: Secondary | ICD-10-CM

## 2018-02-09 ENCOUNTER — Ambulatory Visit
Admission: RE | Admit: 2018-02-09 | Discharge: 2018-02-09 | Disposition: A | Discharge status: Home / Self Care | Payer: Medicare Other | Source: Ambulatory Visit | Attending: Internal Medicine | Admitting: Internal Medicine

## 2018-02-09 DIAGNOSIS — Z1231 Encounter for screening mammogram for malignant neoplasm of breast: Secondary | ICD-10-CM

## 2018-02-13 ENCOUNTER — Other Ambulatory Visit: Payer: Self-pay | Admitting: Internal Medicine

## 2018-03-02 ENCOUNTER — Other Ambulatory Visit: Payer: Self-pay | Admitting: Internal Medicine

## 2018-03-02 DIAGNOSIS — N6489 Other specified disorders of breast: Secondary | ICD-10-CM

## 2018-03-02 DIAGNOSIS — R928 Other abnormal and inconclusive findings on diagnostic imaging of breast: Secondary | ICD-10-CM

## 2018-03-02 DIAGNOSIS — N6310 Unspecified lump in the right breast, unspecified quadrant: Secondary | ICD-10-CM

## 2018-03-16 ENCOUNTER — Ambulatory Visit
Admission: RE | Admit: 2018-03-16 | Discharge: 2018-03-16 | Disposition: A | Discharge status: Home / Self Care | Payer: Medicare Other | Source: Ambulatory Visit | Attending: Internal Medicine | Admitting: Internal Medicine

## 2018-03-16 DIAGNOSIS — R928 Other abnormal and inconclusive findings on diagnostic imaging of breast: Secondary | ICD-10-CM

## 2018-03-16 DIAGNOSIS — N6310 Unspecified lump in the right breast, unspecified quadrant: Secondary | ICD-10-CM | POA: Diagnosis present

## 2018-03-16 DIAGNOSIS — N6489 Other specified disorders of breast: Secondary | ICD-10-CM | POA: Insufficient documentation

## 2018-03-20 ENCOUNTER — Other Ambulatory Visit: Payer: Self-pay | Admitting: Internal Medicine

## 2018-03-20 DIAGNOSIS — N6320 Unspecified lump in the left breast, unspecified quadrant: Secondary | ICD-10-CM

## 2018-03-20 DIAGNOSIS — R928 Other abnormal and inconclusive findings on diagnostic imaging of breast: Secondary | ICD-10-CM

## 2018-03-22 ENCOUNTER — Ambulatory Visit
Admission: RE | Admit: 2018-03-22 | Discharge: 2018-03-22 | Disposition: A | Discharge status: Home / Self Care | Payer: Medicare Other | Source: Ambulatory Visit | Attending: Internal Medicine | Admitting: Internal Medicine

## 2018-03-22 DIAGNOSIS — N6320 Unspecified lump in the left breast, unspecified quadrant: Secondary | ICD-10-CM

## 2018-03-22 DIAGNOSIS — R928 Other abnormal and inconclusive findings on diagnostic imaging of breast: Secondary | ICD-10-CM | POA: Insufficient documentation

## 2018-03-27 ENCOUNTER — Other Ambulatory Visit: Payer: Self-pay | Admitting: Anatomic Pathology & Clinical Pathology

## 2018-03-27 LAB — SURGICAL PATHOLOGY

## 2018-03-28 ENCOUNTER — Encounter: Payer: Self-pay | Admitting: *Deleted

## 2018-03-28 ENCOUNTER — Other Ambulatory Visit: Payer: Self-pay | Admitting: Internal Medicine

## 2018-03-28 DIAGNOSIS — R928 Other abnormal and inconclusive findings on diagnostic imaging of breast: Secondary | ICD-10-CM

## 2018-03-28 DIAGNOSIS — C50919 Malignant neoplasm of unspecified site of unspecified female breast: Secondary | ICD-10-CM

## 2018-03-28 NOTE — Progress Notes (Signed)
  Oncology Nurse Navigator Documentation  Navigator Location: [facility] (03/28/18 1700)   )Navigator Encounter Type: Introductory phone call (03/28/18 1700)   Abnormal Finding Date: 03/16/18 (03/28/18 1700) Confirmed Diagnosis Date: 03/24/18 (03/28/18 1700)                   Barriers/Navigation Needs: Education;Coordination of Care (03/28/18 1700)   Interventions: Coordination of Care (03/28/18 1700)                      Time Spent with Patient: 30 (03/28/18 1700)   Called patient today to establish navigation services.  She is anxious and very appreciate of my call.  She would like to see a Psychologist, sport and exercise at [facility] since her GYN, Dr. Ouida Sills is there.  Informed I will get her scheduled for surgical consult and medical oncology.  She is currently pending an appointment for breast MRI.  I will call her back tomorrow.  She was encouraged to call with any questions or needs.

## 2018-03-30 ENCOUNTER — Other Ambulatory Visit: Payer: Self-pay | Admitting: *Deleted

## 2018-03-30 ENCOUNTER — Ambulatory Visit: Payer: Self-pay | Admitting: General Surgery

## 2018-03-30 DIAGNOSIS — Z170 Estrogen receptor positive status [ER+]: Secondary | ICD-10-CM | POA: Insufficient documentation

## 2018-03-30 DIAGNOSIS — C50412 Malignant neoplasm of upper-outer quadrant of left female breast: Secondary | ICD-10-CM | POA: Insufficient documentation

## 2018-03-30 NOTE — H&P (Signed)
PATIENT PROFILE: Tiffany Mathews is a 57 y.o. female who presents to the Clinic for consultation at the request of Tiffany Mathews for evaluation of breast cancer.  PCP:  Tiffany Athens, MD  HISTORY OF PRESENT ILLNESS: Tiffany Mathews reports she had a routine screening mammogam on 02/09/18. The patient was found with suspicious lesion and diagnostic mammogram and ultrasound was done on 03/16/18. On that one it was confirmed a suspicious lesion on the left breast at 0200. The mass measured 0.8 cm with an adjacent smaller mass. These were biopsied on 03/22/18. 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   Patient denies any palpable mass, pain, skin changes or nipple retraction or discharge.   Family history of breast cancer: Aunt Family history of other cancers: Mother (thyroid) Menarche: 52 years old Menopause: 57 years old Used OCP: on her 45's for around 3 years Used estrogen and progesterone therapy: none  History of Radiation to the chest: none  PROBLEM LIST:         Problem List  Date Reviewed: 12/25/2015         Noted   Malignant neoplasm of upper-outer quadrant of left breast in female, estrogen receptor positive (CMS-HCC) 03/30/2018   Polyarthralgia, unspecified 12/25/2015   Myalgia 12/25/2015   Abnormal finding on examination of thyroid gland 12/25/2015   Macroglossia, congenital 12/25/2015   Rash/skin eruption 12/25/2015   Overview    Feet       Pelvic pain in female Unknown   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) Unknown   Depression Unknown   Bipolar disorder (CMS-HCC) Unknown   Hiatal hernia Unknown   Diverticulitis Unknown   Urolithiasis Unknown   Osteoarthritis Unknown   Thyroid nodule Unknown   Nervous breakdown Unknown      GENERAL REVIEW OF SYSTEMS:    General ROS: negative for - chills, fatigue, fever, weight gain or weight loss Allergy and Immunology ROS: negative for - hives  Hematological and Lymphatic ROS: negative for - bleeding problems or bruising, negative for palpable nodes Endocrine ROS: negative for - heat or cold intolerance, hair changes Respiratory ROS: negative for - cough, shortness of breath or wheezing Cardiovascular ROS: no chest pain or palpitations GI ROS: negative for nausea, vomiting, abdominal pain, diarrhea, constipation Musculoskeletal ROS: negative for - joint swelling or muscle pain Neurological ROS: negative for - confusion, syncope Dermatological ROS: negative for pruritus and rash Psychiatric: negative for anxiety, depression, difficulty sleeping and memory loss  MEDICATIONS: CurrentMedications        Current Outpatient Medications  Medication Sig Dispense Refill  . aspirin 81 MG EC tablet Take 81 mg by mouth once daily.    Marland Kitchen atorvastatin (LIPITOR) 40 MG tablet Take 40 mg by mouth once daily.    . clonazePAM (KLONOPIN) 0.5 MG tablet Take 0.5 mg by mouth 2 (two) times daily as needed for Anxiety.    . cyclobenzaprine (FLEXERIL) 10 MG tablet Take 10 mg by mouth once daily.    . DULoxetine (CYMBALTA) 20 MG DR capsule Take 20 mg by mouth once daily    . gabapentin (NEURONTIN) 100 MG capsule Take 400 mg by mouth once daily.      Marland Kitchen losartan (COZAAR) 100 MG tablet Take 100 mg by mouth once daily.    . pantoprazole (PROTONIX) 40 MG DR tablet  TAKE ONE TABLET BY MOUTH ONCE DAILY    . traZODone (DESYREL) 50 MG tablet Take 50 mg by mouth nightly.    . ziprasidone (GEODON) 40 MG capsule Take 40 mg by mouth once daily    . azelastine (ASTELIN) 137 mcg nasal spray     . fluticasone (FLONASE) 50 mcg/actuation nasal spray      No current facility-administered medications for this visit.       ALLERGIES: Lithium; Depakote [divalproex]; and Lamictal [lamotrigine]  PAST MEDICAL  HISTORY:     Past Medical History:  Diagnosis Date  . Bipolar disorder (CMS-HCC)   . Breast cancer (CMS-HCC) 03/2018  . Depression   . Diverticulitis   . Hiatal hernia   . Hyperlipidemia   . Hypertension   . Nervous breakdown   . Osteoarthritis   . 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 Thyroid nodule   . Urolithiasis     PAST SURGICAL HISTORY:      Past Surgical History:  Procedure Laterality Date  . ARTHROSCOPY SHOULDER Right 2000  . CESAREAN SECTION  01/05/1983  . CESAREAN SECTION  02/28/1986  . CESAREAN SECTION  03/23/1999  . CHOLECYSTECTOMY  2017  . COLPOSCOPY    . ENDOMETRIAL ABLATION W/ NOVASURE  2012   TJS  . left elbow surgery    . Right breast biopsy 2011.    . TUBAL LIGATION       FAMILY HISTORY:      Family History  Problem Relation Age of Onset  . Diabetes type II Mother   . Alzheimer's disease Mother   . Thyroid cancer Mother   . Stroke Mother   . Arthritis Sister   . Diabetes Sister   . Breast cancer Maternal Aunt   . Ovarian cancer Maternal Aunt   . Uterine cancer Maternal Aunt   . Alzheimer's disease Father   . Arthritis Brother   . No Known Problems Maternal Grandmother   . Myocardial Infarction (Heart attack) Maternal Grandfather   . No Known Problems Paternal Grandmother   . Stomach cancer Paternal Grandfather   . Arthritis Sister   . Diabetes Sister   . Heart disease Sister   . Arthritis Brother   . Arthritis Brother   . Arthritis Brother   . Arthritis Brother   . Schizophrenia Daughter   . ADD / ADHD Daughter   . No Known Problems Son      SOCIAL HISTORY: Social History          Socioeconomic History  . Marital status: Divorced    Spouse name: Not on file  . Number of children: Not on file  . Years of education: Not on file  . Highest education level: Not on file  Occupational History  . Not on file  Social Needs  . Financial resource strain: Not on  file  . Food insecurity:    Worry: Not on file    Inability: Not on file  . Transportation needs:    Medical: Not on file    Non-medical: Not on file  Tobacco Use  . Smoking status: Former Smoker    Packs/day: 0.50    Types: Cigarettes    Last attempt to quit: 2015    Years since quitting: 4.8  . Smokeless tobacco: Never Used  Substance and Sexual Activity  . Alcohol use: No  . Drug use: Not on file  . Sexual activity: Not on file  Other Topics Concern  . Not on file  Social History Narrative   Does not work, on disability for arthritis and bipolar disorder.      PHYSICAL EXAM:    Vitals:   03/30/18 1042  BP: (!) 139/94  Pulse: 90  Temp: 36.3 C (97.4 F)   Body mass index is 32.45 kg/m. Weight: 88.5 kg (195 lb)   GENERAL: Alert, active, oriented x3  HEENT: Pupils equal reactive to light. Extraocular movements are intact. Sclera clear. Palpebral conjunctiva normal red color.Pharynx clear.  NECK: Supple with no palpable mass and no adenopathy.  LUNGS: Sound clear with no rales rhonchi or wheezes.  HEART: Regular rhythm S1 and S2 without murmur.  BREAST: breasts appear normal, no suspicious masses, no skin or nipple changes or axillary nodes, right breast normal without mass, skin or nipple changes or axillary nodes, left breast normal without mass, or nipple changes or axillary nodes. There is ecchymosis on the lateral aspect from biopsy site.   ABDOMEN: Soft and depressible, nontender with no palpable mass, no hepatomegaly.  EXTREMITIES: Well-developed well-nourished symmetrical with no dependent edema.  NEUROLOGICAL: Awake alert oriented, facial expression symmetrical, moving all extremities.  REVIEW OF DATA: I have reviewed the following data today:      Initial consult on 03/30/2018  Component Date Value  . Glucose 03/30/2018 89   . Sodium 03/30/2018 141   . Potassium 03/30/2018 3.9   . Chloride 03/30/2018 104   .  Carbon Dioxide (CO2) 03/30/2018 27.2   . Urea Nitrogen (BUN) 03/30/2018 15   . Creatinine 03/30/2018 0.8   . Glomerular Filtration Ra* 03/30/2018 74   . Calcium 03/30/2018 9.3   . AST  03/30/2018 13   . ALT  03/30/2018 12   . Alk Phos (alkaline Phosp* 03/30/2018 50   . Albumin 03/30/2018 4.4   . Bilirubin, Total 03/30/2018 0.6   . Protein, Total 03/30/2018 6.9   . A/G Ratio 03/30/2018 1.8   . WBC (White Blood Cell Co* 03/30/2018 10.6*  . RBC (Red Blood Cell Coun* 03/30/2018 4.56   . Hemoglobin 03/30/2018 14.7   . Hematocrit 03/30/2018 42.8   . MCV (Mean Corpuscular Vo* 03/30/2018 93.9   . MCH (Mean Corpuscular He* 03/30/2018 32.2*  . MCHC (Mean Corpuscular H* 03/30/2018 34.3   . Platelet Count 03/30/2018 377   . RDW-CV (Red Cell Distrib* 03/30/2018 12.9   . MPV (Mean Platelet Volum* 03/30/2018 9.1*  . Neutrophils 03/30/2018 6.19   . Lymphocytes 03/30/2018 3.44   . Monocytes 03/30/2018 0.72   . Eosinophils 03/30/2018 0.12   . Basophils 03/30/2018 0.08   . Neutrophil % 03/30/2018 58.5   . Lymphocyte % 03/30/2018 32.5   . Monocyte % 03/30/2018 6.8   . Eosinophil % 03/30/2018 1.1   . Basophil% 03/30/2018 0.8   . Immature Granulocyte % 03/30/2018 0.3   . Immature Granulocyte Cou* 03/30/2018 0.03      ASSESSMENT: Ms. Lemke is a 57 y.o. female presenting for consultation for breast cancer.    Patient was oriented again about the pathology results. Surgical alternatives were discussed with patient including partial vs total mastectomy. Surgical technique and post operative care was discussed with patient. Risk of surgery was discussed with patient including but not limited to: wound infection, seroma, hematoma, brachial plexopathy, mondor's disease (thrombosis of small veins of breast), chronic wound pain, breast lymphedema, altered sensation to the nipple and cosmesis among others.   Patient was recommended by radiologist before final pathology that she will need MRI. I dont  see a clear   indication for the MRI as per biopsy results and the size and characteristic of the mass. I will discuss with Oncologist and radiologist for the need of MRI. If MRI is not needed, will be able to schedule surgery as soon as possible.   PLAN: 1. Left breast needle guided partial mastectomy with sentinel lymph node biopsy (19301, 38525) 2. CBC, CMP 3. Stop aspirin 5 days before surgery 4. Will discuss with Oncologist the need of MRI 5. Will keep in touch with you for further instructions 6. Contact us if has any question or concern.   Patient verbalized understanding, all questions were answered, and were agreeable with the plan outlined above.   Herbert Pun, MD  Electronically signed by Herbert Pun, MD

## 2018-03-30 NOTE — H&P (View-Only) (Signed)
PATIENT PROFILE: Tiffany Mathews is a 57 y.o. female who presents to the Clinic for consultation at the request of Dr. Schermerhorn for evaluation of breast cancer.  PCP:  Masoud, Javed, MD  HISTORY OF PRESENT ILLNESS: Tiffany Mathews reports she had a routine screening mammogam on 02/09/18. The patient was found with suspicious lesion and diagnostic mammogram and ultrasound was done on 03/16/18. On that one it was confirmed a suspicious lesion on the left breast at 0200. The mass measured 0.8 cm with an adjacent smaller mass. These were biopsied on 03/22/18. 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   Patient denies any palpable mass, pain, skin changes or nipple retraction or discharge.   Family history of breast cancer: Aunt Family history of other cancers: Mother (thyroid) Menarche: 12 years old Menopause: 57 years old Used OCP: on her 20's for around 3 years Used estrogen and progesterone therapy: none  History of Radiation to the chest: none  PROBLEM LIST:         Problem List  Date Reviewed: 12/25/2015         Noted   Malignant neoplasm of upper-outer quadrant of left breast in female, estrogen receptor positive (CMS-HCC) 03/30/2018   Polyarthralgia, unspecified 12/25/2015   Myalgia 12/25/2015   Abnormal finding on examination of thyroid gland 12/25/2015   Macroglossia, congenital 12/25/2015   Rash/skin eruption 12/25/2015   Overview    Feet       Pelvic pain in female Unknown   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) Unknown   Depression Unknown   Bipolar disorder (CMS-HCC) Unknown   Hiatal hernia Unknown   Diverticulitis Unknown   Urolithiasis Unknown   Osteoarthritis Unknown   Thyroid nodule Unknown   Nervous breakdown Unknown      GENERAL REVIEW OF SYSTEMS:    General ROS: negative for - chills, fatigue, fever, weight gain or weight loss Allergy and Immunology ROS: negative for - hives  Hematological and Lymphatic ROS: negative for - bleeding problems or bruising, negative for palpable nodes Endocrine ROS: negative for - heat or cold intolerance, hair changes Respiratory ROS: negative for - cough, shortness of breath or wheezing Cardiovascular ROS: no chest pain or palpitations GI ROS: negative for nausea, vomiting, abdominal pain, diarrhea, constipation Musculoskeletal ROS: negative for - joint swelling or muscle pain Neurological ROS: negative for - confusion, syncope Dermatological ROS: negative for pruritus and rash Psychiatric: negative for anxiety, depression, difficulty sleeping and memory loss  MEDICATIONS: CurrentMedications        Current Outpatient Medications  Medication Sig Dispense Refill  . aspirin 81 MG EC tablet Take 81 mg by mouth once daily.    . atorvastatin (LIPITOR) 40 MG tablet Take 40 mg by mouth once daily.    . clonazePAM (KLONOPIN) 0.5 MG tablet Take 0.5 mg by mouth 2 (two) times daily as needed for Anxiety.    . cyclobenzaprine (FLEXERIL) 10 MG tablet Take 10 mg by mouth once daily.    . DULoxetine (CYMBALTA) 20 MG DR capsule Take 20 mg by mouth once daily    . gabapentin (NEURONTIN) 100 MG capsule Take 400 mg by mouth once daily.      . losartan (COZAAR) 100 MG tablet Take 100 mg by mouth once daily.    . pantoprazole (PROTONIX) 40 MG DR tablet   TAKE ONE TABLET BY MOUTH ONCE DAILY    . traZODone (DESYREL) 50 MG tablet Take 50 mg by mouth nightly.    . ziprasidone (GEODON) 40 MG capsule Take 40 mg by mouth once daily    . azelastine (ASTELIN) 137 mcg nasal spray     . fluticasone (FLONASE) 50 mcg/actuation nasal spray      No current facility-administered medications for this visit.       ALLERGIES: Lithium; Depakote [divalproex]; and Lamictal [lamotrigine]  PAST MEDICAL  HISTORY:     Past Medical History:  Diagnosis Date  . Bipolar disorder (CMS-HCC)   . Breast cancer (CMS-HCC) 03/2018  . Depression   . Diverticulitis   . Hiatal hernia   . Hyperlipidemia   . Hypertension   . Nervous breakdown   . Osteoarthritis   . 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 Thyroid nodule   . Urolithiasis     PAST SURGICAL HISTORY:      Past Surgical History:  Procedure Laterality Date  . ARTHROSCOPY SHOULDER Right 2000  . CESAREAN SECTION  01/05/1983  . CESAREAN SECTION  02/28/1986  . CESAREAN SECTION  03/23/1999  . CHOLECYSTECTOMY  2017  . COLPOSCOPY    . ENDOMETRIAL ABLATION W/ NOVASURE  2012   TJS  . left elbow surgery    . Right breast biopsy 2011.    . TUBAL LIGATION       FAMILY HISTORY:      Family History  Problem Relation Age of Onset  . Diabetes type II Mother   . Alzheimer's disease Mother   . Thyroid cancer Mother   . Stroke Mother   . Arthritis Sister   . Diabetes Sister   . Breast cancer Maternal Aunt   . Ovarian cancer Maternal Aunt   . Uterine cancer Maternal Aunt   . Alzheimer's disease Father   . Arthritis Brother   . No Known Problems Maternal Grandmother   . Myocardial Infarction (Heart attack) Maternal Grandfather   . No Known Problems Paternal Grandmother   . Stomach cancer Paternal Grandfather   . Arthritis Sister   . Diabetes Sister   . Heart disease Sister   . Arthritis Brother   . Arthritis Brother   . Arthritis Brother   . Arthritis Brother   . Schizophrenia Daughter   . ADD / ADHD Daughter   . No Known Problems Son      SOCIAL HISTORY: Social History          Socioeconomic History  . Marital status: Divorced    Spouse name: Not on file  . Number of children: Not on file  . Years of education: Not on file  . Highest education level: Not on file  Occupational History  . Not on file  Social Needs  . Financial resource strain: Not on  file  . Food insecurity:    Worry: Not on file    Inability: Not on file  . Transportation needs:    Medical: Not on file    Non-medical: Not on file  Tobacco Use  . Smoking status: Former Smoker    Packs/day: 0.50    Types: Cigarettes    Last attempt to quit: 2015    Years since quitting: 4.8  . Smokeless tobacco: Never Used  Substance and Sexual Activity  . Alcohol use: No  . Drug use: Not on file  . Sexual activity: Not on file  Other Topics Concern  . Not on file  Social History Narrative   Does not work, on disability for arthritis and bipolar disorder.      PHYSICAL EXAM:    Vitals:   03/30/18 1042  BP: (!) 139/94  Pulse: 90  Temp: 36.3 C (97.4 F)   Body mass index is 32.45 kg/m. Weight: 88.5 kg (195 lb)   GENERAL: Alert, active, oriented x3  HEENT: Pupils equal reactive to light. Extraocular movements are intact. Sclera clear. Palpebral conjunctiva normal red color.Pharynx clear.  NECK: Supple with no palpable mass and no adenopathy.  LUNGS: Sound clear with no rales rhonchi or wheezes.  HEART: Regular rhythm S1 and S2 without murmur.  BREAST: breasts appear normal, no suspicious masses, no skin or nipple changes or axillary nodes, right breast normal without mass, skin or nipple changes or axillary nodes, left breast normal without mass, or nipple changes or axillary nodes. There is ecchymosis on the lateral aspect from biopsy site.   ABDOMEN: Soft and depressible, nontender with no palpable mass, no hepatomegaly.  EXTREMITIES: Well-developed well-nourished symmetrical with no dependent edema.  NEUROLOGICAL: Awake alert oriented, facial expression symmetrical, moving all extremities.  REVIEW OF DATA: I have reviewed the following data today:      Initial consult on 03/30/2018  Component Date Value  . Glucose 03/30/2018 89   . Sodium 03/30/2018 141   . Potassium 03/30/2018 3.9   . Chloride 03/30/2018 104   .  Carbon Dioxide (CO2) 03/30/2018 27.2   . Urea Nitrogen (BUN) 03/30/2018 15   . Creatinine 03/30/2018 0.8   . Glomerular Filtration Ra* 03/30/2018 74   . Calcium 03/30/2018 9.3   . AST  03/30/2018 13   . ALT  03/30/2018 12   . Alk Phos (alkaline Phosp* 03/30/2018 50   . Albumin 03/30/2018 4.4   . Bilirubin, Total 03/30/2018 0.6   . Protein, Total 03/30/2018 6.9   . A/G Ratio 03/30/2018 1.8   . WBC (White Blood Cell Co* 03/30/2018 10.6*  . RBC (Red Blood Cell Coun* 03/30/2018 4.56   . Hemoglobin 03/30/2018 14.7   . Hematocrit 03/30/2018 42.8   . MCV (Mean Corpuscular Vo* 03/30/2018 93.9   . MCH (Mean Corpuscular He* 03/30/2018 32.2*  . MCHC (Mean Corpuscular H* 03/30/2018 34.3   . Platelet Count 03/30/2018 377   . RDW-CV (Red Cell Distrib* 03/30/2018 12.9   . MPV (Mean Platelet Volum* 03/30/2018 9.1*  . Neutrophils 03/30/2018 6.19   . Lymphocytes 03/30/2018 3.44   . Monocytes 03/30/2018 0.72   . Eosinophils 03/30/2018 0.12   . Basophils 03/30/2018 0.08   . Neutrophil % 03/30/2018 58.5   . Lymphocyte % 03/30/2018 32.5   . Monocyte % 03/30/2018 6.8   . Eosinophil % 03/30/2018 1.1   . Basophil% 03/30/2018 0.8   . Immature Granulocyte % 03/30/2018 0.3   . Immature Granulocyte Cou* 03/30/2018 0.03      ASSESSMENT: Ms. Mesta is a 57 y.o. female presenting for consultation for breast cancer.    Patient was oriented again about the pathology results. Surgical alternatives were discussed with patient including partial vs total mastectomy. Surgical technique and post operative care was discussed with patient. Risk of surgery was discussed with patient including but not limited to: wound infection, seroma, hematoma, brachial plexopathy, mondor's disease (thrombosis of small veins of breast), chronic wound pain, breast lymphedema, altered sensation to the nipple and cosmesis among others.   Patient was recommended by radiologist before final pathology that she will need MRI. I dont  see a clear  indication for the MRI as per biopsy results and the size and characteristic of the mass. I will discuss with Oncologist and radiologist for the need of MRI. If MRI is not needed, will be able to schedule surgery as soon as possible.   PLAN: 1. Left breast needle guided partial mastectomy with sentinel lymph node biopsy (19301, [id number]) 2. CBC, CMP 3. Stop aspirin 5 days before surgery 4. Will discuss with Oncologist the need of MRI 5. Will keep in touch with you for further instructions 6. Contact us if has any question or concern.   Patient verbalized understanding, all questions were answered, and were agreeable with the plan outlined above.   Herbert Pun, MD  Electronically signed by Herbert Pun, MD

## 2018-04-03 ENCOUNTER — Other Ambulatory Visit: Payer: Self-pay

## 2018-04-03 ENCOUNTER — Encounter
Admission: RE | Admit: 2018-04-03 | Discharge: 2018-04-03 | Disposition: A | Discharge status: Home / Self Care | Payer: Medicare Other | Source: Ambulatory Visit | Attending: General Surgery | Admitting: General Surgery

## 2018-04-03 ENCOUNTER — Other Ambulatory Visit: Payer: Self-pay | Admitting: General Surgery

## 2018-04-03 DIAGNOSIS — Z170 Estrogen receptor positive status [ER+]: Principal | ICD-10-CM

## 2018-04-03 DIAGNOSIS — Z01818 Encounter for other preprocedural examination: Secondary | ICD-10-CM | POA: Insufficient documentation

## 2018-04-03 DIAGNOSIS — C50412 Malignant neoplasm of upper-outer quadrant of left female breast: Secondary | ICD-10-CM

## 2018-04-03 HISTORY — DX: Personal history of other diseases of the digestive system: Z87.19

## 2018-04-03 HISTORY — DX: Major depressive disorder, single episode, unspecified: F32.9

## 2018-04-03 HISTORY — DX: Depression, unspecified: F32.A

## 2018-04-03 HISTORY — DX: Anxiety disorder, unspecified: F41.9

## 2018-04-03 HISTORY — DX: Nontoxic multinodular goiter: E04.2

## 2018-04-03 NOTE — Patient Instructions (Addendum)
Your procedure is scheduled on: April 05, 2018 Wednesday  Report to [facility] AT 10:15 AM    REMEMBER: Instructions that are not followed completely may result in serious medical risk, up to and including death; or upon the discretion of your surgeon and anesthesiologist your surgery may need to be rescheduled.  Do not eat food after midnight the night before surgery.  No gum chewing, lozengers or hard candies.  You may however, drink CLEAR liquids up to 2 hours before you are scheduled to arrive for your surgery. Do not drink anything within 2 hours of the start of your surgery.  Clear liquids include: - water  - apple juice without pulp - CLEAR gatorade - black coffee or tea (Do NOT add milk or creamers to the coffee or tea) Do NOT drink anything that is not on this list.  Type 1 and Type 2 diabetics should only drink water.  No Alcohol for 24 hours before or after surgery.  No Smoking including e-cigarettes for 24 hours prior to surgery.  No chewable tobacco products for at least 6 hours prior to surgery.  No nicotine patches on the day of surgery.  On the morning of surgery brush your teeth with toothpaste and water, you may rinse your mouth with mouthwash if you wish. Do not swallow any toothpaste or mouthwash.  Notify your doctor if there is any change in your medical condition (cold, fever, infection).  Do not wear jewelry, make-up, hairpins, clips or nail polish.  Do not wear lotions, powders, or perfumes NO DEODORANT    Do not shave 48 hours prior to surgery.   Contacts and dentures may not be worn into surgery.  Do not bring valuables to the hospital, including drivers license, insurance or credit cards.  [facility] is not responsible for any belongings or valuables.   TAKE THESE MEDICATIONS THE MORNING OF SURGERY: ATORVASTATIN KLONOPIN CYMBALTA GABAPENTIN   PANTOPRAZOLE (take one the night before and one on the morning of surgery - helps  to prevent nausea after surgery.)  Use CHG Soap  as directed on instruction sheet.  Bring your C-PAP to the hospital with you in case you may have to spend the night.   Follow recommendations from Cardiologist, Pulmonologist or PCP regarding stopping Aspirin,  Stop Anti-inflammatories (NSAIDS) such as Advil, Aleve, Ibuprofen, Motrin, Naproxen, Naprosyn and Aspirin based products such as Excedrin, Goodys Powder, BC Powder. (May take Tylenol or Acetaminophen if needed.)  Stop ANY OVER THE COUNTER supplements until after surgery. (May continue Vitamin D, Vitamin B, and multivitamin.)  Wear comfortable clothing (specific to your surgery type) to the hospital.  Plan for stool softeners for home use.  If you are being admitted to the hospital overnight, leave your suitcase in the car. After surgery it may be brought to your room.  If you are being discharged the day of surgery, you will not be allowed to drive home. You will need a responsible adult to drive you home and stay with you that night.   If you are taking public transportation, you will need to have a responsible adult with you. Please confirm with your physician that it is acceptable to use public transportation.   Please call [phone] if you have any questions about these instructions.

## 2018-04-04 ENCOUNTER — Inpatient Hospital Stay: Payer: Medicare Other

## 2018-04-04 ENCOUNTER — Encounter: Payer: Self-pay | Admitting: *Deleted

## 2018-04-04 ENCOUNTER — Telehealth: Payer: Self-pay | Admitting: Genetic Counselor

## 2018-04-04 ENCOUNTER — Inpatient Hospital Stay: Payer: Medicare Other | Attending: Oncology | Admitting: Oncology

## 2018-04-04 ENCOUNTER — Encounter: Payer: Self-pay | Admitting: Oncology

## 2018-04-04 VITALS — BP 159/89 | HR 97 | Temp 97.3°F | Resp 18 | Ht 65.0 in | Wt 181.2 lb

## 2018-04-04 DIAGNOSIS — C50912 Malignant neoplasm of unspecified site of left female breast: Secondary | ICD-10-CM | POA: Insufficient documentation

## 2018-04-04 DIAGNOSIS — F17210 Nicotine dependence, cigarettes, uncomplicated: Secondary | ICD-10-CM | POA: Diagnosis not present

## 2018-04-04 DIAGNOSIS — Z8041 Family history of malignant neoplasm of ovary: Secondary | ICD-10-CM | POA: Diagnosis not present

## 2018-04-04 DIAGNOSIS — E041 Nontoxic single thyroid nodule: Secondary | ICD-10-CM | POA: Insufficient documentation

## 2018-04-04 DIAGNOSIS — Z79899 Other long term (current) drug therapy: Secondary | ICD-10-CM | POA: Insufficient documentation

## 2018-04-04 DIAGNOSIS — Z7982 Long term (current) use of aspirin: Secondary | ICD-10-CM | POA: Insufficient documentation

## 2018-04-04 DIAGNOSIS — F319 Bipolar disorder, unspecified: Secondary | ICD-10-CM | POA: Insufficient documentation

## 2018-04-04 DIAGNOSIS — E785 Hyperlipidemia, unspecified: Secondary | ICD-10-CM | POA: Diagnosis not present

## 2018-04-04 DIAGNOSIS — G4730 Sleep apnea, unspecified: Secondary | ICD-10-CM | POA: Insufficient documentation

## 2018-04-04 DIAGNOSIS — M1990 Unspecified osteoarthritis, unspecified site: Secondary | ICD-10-CM | POA: Diagnosis not present

## 2018-04-04 DIAGNOSIS — K828 Other specified diseases of gallbladder: Secondary | ICD-10-CM

## 2018-04-04 DIAGNOSIS — R102 Pelvic and perineal pain: Secondary | ICD-10-CM | POA: Diagnosis not present

## 2018-04-04 DIAGNOSIS — Z800 Family history of malignant neoplasm of digestive organs: Secondary | ICD-10-CM | POA: Diagnosis not present

## 2018-04-04 DIAGNOSIS — Z803 Family history of malignant neoplasm of breast: Secondary | ICD-10-CM | POA: Diagnosis not present

## 2018-04-04 DIAGNOSIS — M797 Fibromyalgia: Secondary | ICD-10-CM | POA: Diagnosis not present

## 2018-04-04 DIAGNOSIS — Z170 Estrogen receptor positive status [ER+]: Secondary | ICD-10-CM | POA: Diagnosis not present

## 2018-04-04 DIAGNOSIS — Z7189 Other specified counseling: Secondary | ICD-10-CM

## 2018-04-04 NOTE — Telephone Encounter (Signed)
Cancer Genetics            Telegenetics Initial Visit    Patient Name: Tiffany Mathews Patient DOB: Jun 26, 1960 Patient Age: 57 y.o. Phone Call Date: 04/04/2018  Referring Provider: Randa Evens, MD  Reason for Visit: Evaluate for hereditary susceptibility to cancer    Assessment and Plan:  . Tiffany Mathews personal history of breast cancer at age 89 is not highly suggestive of a hereditary predisposition to cancer, but given that her niece had both breast and ovarian cancers, a genetics evaluation is indicated.   . Testing is recommended to determine whether she has a pathogenic mutation that may help her make surgical decisions as well as impact her screening and risk-reduction for cancer. A negative result will be reassuring.  . Tiffany Mathews wished to pursue genetic testing. Her blood was drawn today during her clinic visit with Dr. Janese Banks and her sample was sent to [facility] for analysis. [facility]'s STAT breast panel was requested as it will impact surgical decisions. Results should be available in about 7-12 days. The 9 genes on this panel are ATM, BRCA1, BRCA2, CDH1, CHEK2, PALB2, PTEN, STK11, TP53. Once this test is complete, analysis of additional genes on a larger hereditary cancer panel will proceed. She will be called after each result is obtained.   Dr. Grayland Ormond was available for questions concerning this case. Total time spent by counseling by phone was approximately 25 minutes.   _____________________________________________________________________   History of Present Illness: Tiffany Mathews, a 57 y.o. female, was referred for genetic counseling to discuss the possibility of a hereditary predisposition to cancer and discuss whether genetic testing is warranted. This was a telegenetics visit via phone.  Tiffany Mathews was recently diagnosed with breast cancer at the age of 13. She indicated that she will be using results of genetic testing to decide which  surgery to have, but that she is already scheduled for a lumpectomy on 04/19/18. This may change based on results of genetic testing as well as an upcoming MRI on 04/11/18. The breast tumor was ER positive, PR positive, and HER2 negative.   Past Medical History:  Diagnosis Date  . Anxiety   . Arthritis   . Bipolar 1 disorder ([place])   . Breast cancer ([place])   . Depression   . Fibromyalgia   . GERD (gastroesophageal reflux disease)   . History of hiatal hernia   . Hyperlipidemia   . Hypertension   . Multiple thyroid nodules    benign  . Sleep apnea     Past Surgical History:  Procedure Laterality Date  . ABLATION  04/2011  . BREAST BIOPSY Right 2009   -  . CESAREAN SECTION     x3  . CHOLECYSTECTOMY    . GALLBLADDER SURGERY    . LYSIS OF ADHESION  05/27/2016   Procedure: LYSIS OF ADHESION;  Surgeon: Clayburn Pert, MD;  Location: [facility] ORS;  Service: General;;  . ROBOTIC ASSISTED LAPAROSCOPIC CHOLECYSTECTOMY N/A 05/27/2016   Procedure: ROBOTIC ASSISTED LAPAROSCOPIC CHOLECYSTECTOMY;  Surgeon: Clayburn Pert, MD;  Location: [facility] ORS;  Service: General;  Laterality: N/A;  . SHOULDER ARTHROSCOPY  2000  . TUBAL LIGATION      Family History: Significant diagnoses include the following:  Family History  Problem Relation Age of Onset  . Thyroid cancer Mother        dx 5s; currently 44  . Hypertension Mother   . Diabetes Mother   .  Breast cancer Maternal Aunt 41       deceased 31s  . Alzheimer's disease Father        deceased 41  . Stroke Father   . Drug abuse Sister        deceased 45  . Diabetes Brother   . Hypertension Brother   . Heart attack Maternal Grandfather   . Diabetes Brother   . Hypertension Brother   . Alcohol abuse Brother   . Schizophrenia Brother   . Stomach cancer Paternal Aunt        age at dx unknown  . Stomach cancer Paternal Uncle        age at dx unknown  . Cancer Other        daughter of sister who died at 47; breast ca in 33s and ovarian  cancer at 54; currently 39s    Additionally, Tiffany Mathews has a son and 2 daughters. She has 4 brothers and 2 living sisters. One sister (noted above) died of a drug overdose. She had the daughter who had both breast and ovarian cancers. This niece of Tiffany Mathews has not had genetic testing. Tiffany Mathews mother had 3 brothers and 5 sisters. Her father had 3 brothers and 3 sisters.  Tiffany Mathews family is [place]. There is no known Jewish ancestry and no consanguinity.  Discussion: We reviewed the characteristics, features and inheritance patterns of hereditary cancer syndromes. We discussed her risk of harboring a mutation in the context of her personal and family history. We discussed the process of genetic testing, insurance coverage and implications of results: positive, negative and variant of uncertain significance (VUS).   Tiffany Mathews questions were answered to her satisfaction today and she is welcome to call with any additional questions or concerns. Thank you for the referral and allowing [place] to share in the care of your patient.    Steele Berg, MS, [place] Certified Genetic Counselor phone: [phone]

## 2018-04-04 NOTE — Telephone Encounter (Signed)
Received request from Moishe Spice for genetic counseling for a patient that needs STAT genetic testing. Dr. Janese Banks saw Tiffany Mathews today due a new diagnosis of breast cancer. She has surgery scheduled on 11/13.  I left her a message to call and schedule this telegenetics visit to be done by phone. Her blood was already drawn in clinic today and sent to [facility].   Tiffany Mathews, [place], [place] Genetic Counselor Phone: [phone]

## 2018-04-04 NOTE — Progress Notes (Signed)
  Oncology Nurse Navigator Documentation  Navigator Location: [facility] (04/04/18 1600) Referral date to RadOnc/MedOnc: 04/04/18 (04/04/18 1600) )        Surgery Date: 04/19/18 (04/04/18 1600) Genetic Counseling Date: 04/04/18 (04/04/18 1600) Genetic Counseling Type: Urgent (04/04/18 1600)         Patient Visit Type: MedOnc (04/04/18 1600) Treatment Phase: Pre-Tx/Tx Discussion (04/04/18 1600)                            Time Spent with Patient: 60 (04/04/18 1600)   Met with patient today during her initial medical oncology consult with Dr. Janese Banks.  Patient with family history of breast and ovarian cancer.  She is planning for surgery on 04/19/18 and MRI of the breast on 04/11/18.  Dr. Janese Banks recommended genetic counseling.  Surgery dependant on results.  Patient is to call if she has any questions or needs.

## 2018-04-05 ENCOUNTER — Ambulatory Visit: Payer: Medicare Other

## 2018-04-05 ENCOUNTER — Ambulatory Visit
Admission: RE | Admit: 2018-04-05 | Discharge: 2018-04-05 | Disposition: A | Discharge status: Home / Self Care | Payer: Medicare Other | Source: Ambulatory Visit | Attending: General Surgery | Admitting: General Surgery

## 2018-04-05 ENCOUNTER — Other Ambulatory Visit: Payer: Self-pay | Admitting: General Surgery

## 2018-04-05 DIAGNOSIS — C50211 Malignant neoplasm of upper-inner quadrant of right female breast: Secondary | ICD-10-CM

## 2018-04-05 DIAGNOSIS — Z170 Estrogen receptor positive status [ER+]: Secondary | ICD-10-CM

## 2018-04-07 ENCOUNTER — Encounter: Payer: Self-pay | Admitting: Oncology

## 2018-04-07 DIAGNOSIS — Z170 Estrogen receptor positive status [ER+]: Secondary | ICD-10-CM

## 2018-04-07 DIAGNOSIS — C50912 Malignant neoplasm of unspecified site of left female breast: Secondary | ICD-10-CM

## 2018-04-07 NOTE — Progress Notes (Signed)
Hematology/Oncology Consult note [facility] Telephone:([phone] [phone]  Patient Care Team: Cletis Athens, MD as PCP - General (Internal Medicine)   Name of the patient: Tiffany Mathews  [id number]  1960/12/02    Reason for referral-new diagnosis of breast cancer   Referring physician- Dr. Lavera Guise  Date of visit: 04/07/18   History of presenting illness-patient is a 57 year old Hispanic female who underwent bilateral screening mammogram in September 2019 which showed a suspicious mass in the left breast 2 o'clock position measuring 0.8 x 0.5 x 0.8 cm in size.  Approximately 1.2 cm lateral to this mass were 2-3 tightly clustered hypoechoic nodules together measuring 29 x 0.2 x 0.8 cm.  There was also an other hypoechoic area which was suggestive of fibrocystic tissue.  Several other tiny hypoechoic masses were noted with prominent ducts which could represent fibrocystic change.  DCIS is also a concern.  Ultrasound of the left axilla did not reveal any pathologic adenopathy.  Patient underwent core biopsy of the dominant mass as well as the cluster of nodules immediately adjacent to it.  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  Biopsy of the second breast mass was negative for atypia and malignancy.  Patient's case was discussed at tumor board and given the heterogeneity of mammogram findings MRI of bilateral breasts was recommended with and is scheduled on 04/15/2018.   Patient is also met with Dr. Peyton Najjar and plans to undergo lumpectomy tentatively on 1113.  Patient is G3, P3 L3.  Menarche at the age of 8.  She used birth control about 30 years ago.  No hormone replacement therapy.  Underwent left breast biopsy in 2011 which was negative for malignancy.  Family history significant for maternal niece who had ovarian cancer and breast cancer in her 16s.  Another  maternal aunt who had breast cancer.  Patient's father had prostate cancer.  Paternal aunt and paternal uncle had stomach cancer  ECOG PS- 0  Pain scale- 0   Review of systems- Review of Systems  Constitutional: Negative for chills, fever, malaise/fatigue and weight loss.  HENT: Negative for congestion, ear discharge and nosebleeds.   Eyes: Negative for blurred vision.  Respiratory: Negative for cough, hemoptysis, sputum production, shortness of breath and wheezing.   Cardiovascular: Negative for chest pain, palpitations, orthopnea and claudication.  Gastrointestinal: Negative for abdominal pain, blood in stool, constipation, diarrhea, heartburn, melena, nausea and vomiting.  Genitourinary: Negative for dysuria, flank pain, frequency, hematuria and urgency.  Musculoskeletal: Negative for back pain, joint pain and myalgias.  Skin: Negative for rash.  Neurological: Negative for dizziness, tingling, focal weakness, seizures, weakness and headaches.  Endo/Heme/Allergies: Does not bruise/bleed easily.  Psychiatric/Behavioral: Negative for depression and suicidal ideas. The patient does not have insomnia.     Allergies  Allergen Reactions  . Lamictal [Lamotrigine] Rash and Other (See Comments)  . Depakote [Divalproex Sodium] Other (See Comments)    Severe hair loss  . Lithium Other (See Comments)    Unknown; "It just made me feel bad."    Patient Active Problem List   Diagnosis Date Noted  . Biliary dyskinesia 04/08/2016  . Bipolar affective disorder ([place]) 02/23/2016  . Clinical depression 02/23/2016  . Diverticulitis 02/23/2016  . 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 Dysthymia 02/23/2016  . Arthritis, degenerative 02/23/2016  . Adnexal pain 02/23/2016  . HPV test positive 02/23/2016  . Thyroid nodule 02/23/2016  .  Calculus (=stone) 02/23/2016  . 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 Myalgia 12/25/2015  . Polyarthralgia 12/25/2015  . Panic disorder with agoraphobia and moderate  panic attacks 12/12/2014  . Bipolar I disorder, most recent episode mixed ([place]) 12/12/2014     Past Medical History:  Diagnosis Date  . Anxiety   . Arthritis   . Bipolar 1 disorder ([place])   . Breast cancer ([place])   . Depression   . Fibromyalgia   . GERD (gastroesophageal reflux disease)   . History of hiatal hernia   . Hyperlipidemia   . Hypertension   . Multiple thyroid nodules    benign  . Sleep apnea      Past Surgical History:  Procedure Laterality Date  . ABLATION  04/2011  . BREAST BIOPSY Right 2009   -  . CESAREAN SECTION     x3  . CHOLECYSTECTOMY    . GALLBLADDER SURGERY    . LYSIS OF ADHESION  05/27/2016   Procedure: LYSIS OF ADHESION;  Surgeon: Clayburn Pert, MD;  Location: [facility] ORS;  Service: General;;  . ROBOTIC ASSISTED LAPAROSCOPIC CHOLECYSTECTOMY N/A 05/27/2016   Procedure: ROBOTIC ASSISTED LAPAROSCOPIC CHOLECYSTECTOMY;  Surgeon: Clayburn Pert, MD;  Location: [facility] ORS;  Service: General;  Laterality: N/A;  . SHOULDER ARTHROSCOPY  2000  . TUBAL LIGATION      Social History   Socioeconomic History  . Marital status: Divorced    Spouse name: Not on file  . Number of children: Not on file  . Years of education: Not on file  . Highest education level: Not on file  Occupational History  . Not on file  Social Needs  . Financial resource strain: Not on file  . Food insecurity:    Worry: Not on file    Inability: Not on file  . Transportation needs:    Medical: Not on file    Non-medical: Not on file  Tobacco Use  . Smoking status: Current Every Day Smoker    Packs/day: 1.00    Years: 20.00    Pack years: 20.00    Last attempt to quit: 2016    Years since quitting: 3.8  . Smokeless tobacco: Never Used  Substance and Sexual Activity  . Alcohol use: No  . Drug use: No  . Sexual activity: Not on file  Lifestyle  . Physical activity:    Days per week: Not on file    Minutes per session: Not on file  . Stress: Not on file  Relationships    . Social connections:    Talks on phone: Not on file    Gets together: Not on file    Attends religious service: Not on file    Active member of club or organization: Not on file    Attends meetings of clubs or organizations: Not on file    Relationship status: Not on file  . Intimate partner violence:    Fear of current or ex partner: Not on file    Emotionally abused: Not on file    Physically abused: Not on file    Forced sexual activity: Not on file  Other Topics Concern  . Not on file  Social History Narrative  . Not on file     Family History  Problem Relation Age of Onset  . Thyroid cancer Mother        dx 70s; currently 65  . Hypertension Mother   . Diabetes Mother   . Breast cancer Maternal Aunt 40  deceased 72s  . Alzheimer's disease Father        deceased 46  . Stroke Father   . Drug abuse Sister        deceased 32  . Diabetes Brother   . Hypertension Brother   . Heart attack Maternal Grandfather   . Diabetes Brother   . Hypertension Brother   . Alcohol abuse Brother   . Schizophrenia Brother   . Stomach cancer Paternal Aunt        age at dx unknown  . Stomach cancer Paternal Uncle        age at dx unknown  . Cancer Other        daughter of sister who died at 76; breast ca in 20s and ovarian cancer at 82; currently 81s     Current Outpatient Medications:  .  aspirin EC 81 MG tablet, Take by mouth., Disp: , Rfl:  .  atorvastatin (LIPITOR) 40 MG tablet, Take 40 mg by mouth daily. , Disp: , Rfl:  .  clonazePAM (KLONOPIN) 0.5 MG tablet, Take 0.5 mg by mouth 2 (two) times daily. , Disp: , Rfl:  .  cyclobenzaprine (FLEXERIL) 10 MG tablet, Take 10 mg by mouth at bedtime. , Disp: , Rfl:  .  DULoxetine (CYMBALTA) 20 MG capsule, Take 20 mg by mouth daily., Disp: , Rfl:  .  gabapentin (NEURONTIN) 300 MG capsule, Take 300 mg by mouth 3 (three) times daily. , Disp: , Rfl: 1 .  losartan (COZAAR) 100 MG tablet, Take 100 mg by mouth 2 (two) times daily. , Disp:  , Rfl:  .  Multiple Vitamins-Minerals (MULTIVITAMIN WITH MINERALS) tablet, Take 1 tablet by mouth daily., Disp: , Rfl:  .  pantoprazole (PROTONIX) 40 MG tablet, Take 1 tablet (40 mg total) by mouth daily., Disp: 90 tablet, Rfl: 3 .  ziprasidone (GEODON) 40 MG capsule, Take 40 mg by mouth every evening. , Disp: , Rfl:  .  loratadine (CLARITIN) 10 MG tablet, Take 10 mg by mouth daily as needed for allergies., Disp: , Rfl: 3 .  meloxicam (MOBIC) 15 MG tablet, Take 1 tablet (15 mg total) by mouth daily. (Patient not taking: Reported on 04/04/2018), Disp: 30 tablet, Rfl: 2 .  traZODone (DESYREL) 100 MG tablet, Take 100 mg by mouth at bedtime as needed (sleep). , Disp: , Rfl: 1 .  triamcinolone cream (KENALOG) 0.1 %, Apply 1 application topically 2 (two) times daily as needed (for foot rash). , Disp: , Rfl:    Physical exam:  Vitals:   04/04/18 1324  BP: (!) 159/89  Pulse: 97  Resp: 18  Temp: (!) 97.3 F (36.3 C)  TempSrc: Tympanic  SpO2: 96%  Weight: 181 lb 3.2 oz (82.2 kg)  Height: 5' 5" (1.651 m)   Physical Exam  Constitutional: She is oriented to person, place, and time. She appears well-developed and well-nourished.  HENT:  Head: Normocephalic and atraumatic.  Eyes: Pupils are equal, round, and reactive to light. EOM are normal.  Neck: Normal range of motion.  Cardiovascular: Normal rate, regular rhythm and normal heart sounds.  Pulmonary/Chest: Effort normal and breath sounds normal.  Abdominal: Soft. Bowel sounds are normal.  Neurological: She is alert and oriented to person, place, and time.  Skin: Skin is warm and dry.  Bruising noted at the site of biopsy.  No obvious palpable mass    CMP Latest Ref Rng & Units 07/10/2015  Glucose 65 - 99 mg/dL 123(H)  BUN 6 - 20  mg/dL 14  Creatinine 0.44 - 1.00 mg/dL 0.83  Sodium 135 - 145 mmol/L 141  Potassium 3.5 - 5.1 mmol/L 3.7  Chloride 101 - 111 mmol/L 110  CO2 22 - 32 mmol/L 26  Calcium 8.9 - 10.3 mg/dL 8.8(L)   CBC Latest  Ref Rng & Units 07/10/2015  WBC 3.6 - 11.0 K/uL 8.5  Hemoglobin 12.0 - 16.0 g/dL 14.0  Hematocrit 35.0 - 47.0 % 41.5  Platelets 150 - 440 K/uL 351    No images are attached to the encounter.  US Breast Ltd Uni Left Inc Axilla  Result Date: 03/16/2018 CLINICAL DATA:  Bilateral screening recall for a right breast mass and a left breast asymmetry. EXAM: DIGITAL DIAGNOSTIC BILATERAL MAMMOGRAM WITH CAD AND TOMO ULTRASOUND LEFT BREAST COMPARISON:  Previous exam(s). ACR Breast Density Category b: There are scattered areas of fibroglandular density. FINDINGS: In the upper-outer quadrant of the left breast there is a suspicious spiculated mass in the middle depth. Spot compression tomosynthesis images through the right breast demonstrates stable tissue, unchanged from prior mammograms. Mammographic images were processed with CAD. Ultrasound of the left breast at 2 o'clock, 3 cm from the nipple demonstrates an irregular hypoechoic vascular mass measuring 0.8 x 0.5 x 0.8 cm. Approximately 1.2 cm lateral to this mass is 2-3 tightly clustered hypoechoic nodules together measuring 0.9 x 0.2 x 0.8 cm. Medial to the dominant mass there is a separate hypoechoic area which has an appearance more suggestive of fibrocystic tissue. There are several other tiny hypoechoic masses as well as a few prominent ducts. While this could represent fibrocystic change, DCIS is also of concern. Ultrasound of the left axilla demonstrates multiple normal-appearing lymph nodes. IMPRESSION: 1. There is a suspicious mass in the left breast at 2 o'clock. There are several other surrounding masses, most suspicious of which is 1.2 cm lateral, which I suspect is a satellite lesion. There are multiple other surrounding hypoechoic masses and prominent ducts. This could represent fibrocystic change, however DCIS cannot be excluded. 2.  No evidence of left axillary lymphadenopathy. 3.  The mass in the right breast is stable, and therefore benign.  RECOMMENDATION: 1. Ultrasound-guided biopsy is recommended for the dominant left breast mass at 2 o'clock and the most suspicious of the satellite lesions just lateral to the dominant mass. 2. If the above biopsy is malignant, MRI should be considered to determine the full extent of disease given the complexity of the ultrasound. I have discussed the findings and recommendations with the patient. Results were also provided in writing at the conclusion of the visit. If applicable, a reminder letter will be sent to the patient regarding the next appointment. BI-RADS CATEGORY  5: Highly suggestive of malignancy. Electronically Signed   By: Ammie Ferrier M.D.   On: 03/16/2018 12:41   Mm Diag Breast Tomo Bilateral  Result Date: 03/16/2018 CLINICAL DATA:  Bilateral screening recall for a right breast mass and a left breast asymmetry. EXAM: DIGITAL DIAGNOSTIC BILATERAL MAMMOGRAM WITH CAD AND TOMO ULTRASOUND LEFT BREAST COMPARISON:  Previous exam(s). ACR Breast Density Category b: There are scattered areas of fibroglandular density. FINDINGS: In the upper-outer quadrant of the left breast there is a suspicious spiculated mass in the middle depth. Spot compression tomosynthesis images through the right breast demonstrates stable tissue, unchanged from prior mammograms. Mammographic images were processed with CAD. Ultrasound of the left breast at 2 o'clock, 3 cm from the nipple demonstrates an irregular hypoechoic vascular mass measuring 0.8 x 0.5 x 0.8 cm.  Approximately 1.2 cm lateral to this mass is 2-3 tightly clustered hypoechoic nodules together measuring 0.9 x 0.2 x 0.8 cm. Medial to the dominant mass there is a separate hypoechoic area which has an appearance more suggestive of fibrocystic tissue. There are several other tiny hypoechoic masses as well as a few prominent ducts. While this could represent fibrocystic change, DCIS is also of concern. Ultrasound of the left axilla demonstrates multiple  normal-appearing lymph nodes. IMPRESSION: 1. There is a suspicious mass in the left breast at 2 o'clock. There are several other surrounding masses, most suspicious of which is 1.2 cm lateral, which I suspect is a satellite lesion. There are multiple other surrounding hypoechoic masses and prominent ducts. This could represent fibrocystic change, however DCIS cannot be excluded. 2.  No evidence of left axillary lymphadenopathy. 3.  The mass in the right breast is stable, and therefore benign. RECOMMENDATION: 1. Ultrasound-guided biopsy is recommended for the dominant left breast mass at 2 o'clock and the most suspicious of the satellite lesions just lateral to the dominant mass. 2. If the above biopsy is malignant, MRI should be considered to determine the full extent of disease given the complexity of the ultrasound. I have discussed the findings and recommendations with the patient. Results were also provided in writing at the conclusion of the visit. If applicable, a reminder letter will be sent to the patient regarding the next appointment. BI-RADS CATEGORY  5: Highly suggestive of malignancy. Electronically Signed   By: Ammie Ferrier M.D.   On: 03/16/2018 12:41   Mm Clip Placement Left  Result Date: 03/22/2018 CLINICAL DATA:  Status post ultrasound-guided core biopsies of masses in the left breast. EXAM: DIAGNOSTIC LEFT MAMMOGRAM POST ultrasound BIOPSIES COMPARISON:  Previous exam(s). FINDINGS: Mammographic images were obtained following ultrasound guided biopsies of the left breast. Mammographic images show there is a ribbon shaped clip associated with a mass in the left breast at 2 o'clock 3 cm from the nipple. There is a heart shaped clip 1.3 cm posterolateral to the ribbon shaped clip in appropriate position. IMPRESSION: Status post ultrasound-guided core biopsies of the left breast with pathology pending. Final Assessment: Post Procedure Mammograms for Marker Placement Electronically Signed   By:  Lillia Mountain M.D.   On: 03/22/2018 11:16   [place] Lt Breast Bx W Loc Dev 1st Lesion Img Bx Spec US Guide  Addendum Date: 03/28/2018   ADDENDUM REPORT: 03/28/2018 10:34 ADDENDUM: Pathology of the left breast biopsy, 2:00 o'clock 3 cm from nipple revealed A. BREAST, LEFT, 2 O'CLOCK 3 CM FROM NIPPLE; ULTRASOUND-GUIDED CORE BIOPSY: INVASIVE MAMMARY CARCINOMA, NO SPECIAL TYPE. Size of invasive carcinoma: 6 mm in this sample. Histologic grade of invasive carcinoma: Grade 1. Ductal carcinoma in situ: Not identified. Lymphovascular invasion: Not identified. ER/PR/HER2: Immunohistochemistry will be performed on block A1, with reflex to [place] for HER2 2+. The results will be reported in an addendum. Comment: The definitive grade will be assigned on the excisional specimen. These findings were communicated to [facility] in Dr. Clarise Cruz office on 03/24/2018 at 11:56 AM. Read back procedure was performed. Pathology of the left breast, 2:00 o'clock 4 cm from nipple revealed B. 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 NEGATIVE FOR ATYPIA AND MALIGNANCY. Comment: Additional deeper sections were reviewed on part B. The histologic changes are not specific, and correlation with imaging is needed to be sure  the samples represent the area of concern. This was found to be concordant with Dr. Clarise Cruz notes and impression and confirmed by Dr. Jetta Lout. Recommendation: Bilateral breast MRI without and with contrast to determine extent of disease. Please see Dr. Theda Sers' dictation of mammogram on 03/17/19. Also recommend surgical and oncology referrals. At the patient's request, results and recommendations were relayed to the patient by phone by Dr. Jetta Lout on 03/27/18. The patient stated she did well following the biopsy. Post biopsy instructions were reviewed with the patient and all of her questions  were answered. She will be contacted by the nurse navigators for [facility] to arrange surgical and oncology referrals. A request for breast MRI will be sent to her provider's office by the schedulers for the [facility]. Addendum by Jetta Lout, RRA on 03/28/18. Electronically Signed   By: Fidela Salisbury M.D.   On: 03/28/2018 10:34   Result Date: 03/28/2018 CLINICAL DATA:  Suspicious masses in the left breast. EXAM: ULTRASOUND GUIDED LEFT BREAST CORE NEEDLE BIOPSIES COMPARISON:  Previous exam(s). FINDINGS: I met with the patient and we discussed the procedure of ultrasound-guided biopsy, including benefits and alternatives. We discussed the high likelihood of a successful procedure. We discussed the risks of the procedure, including infection, bleeding, tissue injury, clip migration, and inadequate sampling. Informed written consent was given. The usual time-out protocol was performed immediately prior to the procedure. Lesion quadrant: Upper outer quadrant Using sterile technique and 1% Lidocaine as local anesthetic, under direct ultrasound visualization, a 14 gauge spring-loaded device was used to perform biopsy of a mass in the 2 o'clock region of the left breast 3 cm from the nipple using a lateral to medial approach. At the conclusion of the procedure a ribbon shaped tissue marker clip was deployed into the biopsy cavity. Follow up 2 view mammogram was performed and dictated separately. I met with the patient and we discussed the procedure of ultrasound-guided biopsy, including benefits and alternatives. We discussed the high likelihood of a successful procedure. We discussed the risks of the procedure, including infection, bleeding, tissue injury, clip migration, and inadequate sampling. Informed written consent was given. The usual time-out protocol was performed immediately prior to the procedure. Lesion quadrant: Upper outer quadrant Using sterile technique and 1%  Lidocaine as local anesthetic, under direct ultrasound visualization, a 14 gauge spring-loaded device was used to perform biopsy of a mass in the 2 o'clock region of the left breast 4 cm from the nipple using a lateral to medial approach. At the conclusion of the procedure a heart shaped tissue marker clip was deployed into the biopsy cavity. Follow up 2 view mammogram was performed and dictated separately. IMPRESSION: Ultrasound guided biopsies of masses in the left breast. No apparent complications. Electronically Signed: By: Lillia Mountain M.D. On: 03/22/2018 11:19   [place] Lt Breast Bx W Loc Dev Ea Add Lesion Img Bx Spec US Guide  Addendum Date: 03/28/2018   ADDENDUM REPORT: 03/28/2018 10:34 ADDENDUM: Pathology of the left breast biopsy, 2:00 o'clock 3 cm from nipple revealed A. BREAST, LEFT, 2 O'CLOCK 3 CM FROM NIPPLE; ULTRASOUND-GUIDED CORE BIOPSY: INVASIVE MAMMARY CARCINOMA, NO SPECIAL TYPE. Size of invasive carcinoma: 6 mm in this sample. Histologic grade of invasive carcinoma: Grade 1. Ductal carcinoma in situ: Not identified. Lymphovascular invasion: Not identified. ER/PR/HER2: Immunohistochemistry will be performed on block A1, with reflex to [place] for HER2 2+. The results will be reported in an addendum. Comment: The definitive grade will be assigned on the  excisional specimen. These findings were communicated to [facility] in Dr. Clarise Cruz office on 03/24/2018 at 11:56 AM. Read back procedure was performed. Pathology of the left breast, 2:00 o'clock 4 cm from nipple revealed B. 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 NEGATIVE FOR ATYPIA AND MALIGNANCY. Comment: Additional deeper sections were reviewed on part B. The histologic changes are not specific, and correlation with imaging is needed to be sure the samples represent the area of concern. This was found to be  concordant with Dr. Clarise Cruz notes and impression and confirmed by Dr. Jetta Lout. Recommendation: Bilateral breast MRI without and with contrast to determine extent of disease. Please see Dr. Theda Sers' dictation of mammogram on 03/17/19. Also recommend surgical and oncology referrals. At the patient's request, results and recommendations were relayed to the patient by phone by Dr. Jetta Lout on 03/27/18. The patient stated she did well following the biopsy. Post biopsy instructions were reviewed with the patient and all of her questions were answered. She will be contacted by the nurse navigators for [facility] to arrange surgical and oncology referrals. A request for breast MRI will be sent to her provider's office by the schedulers for the [facility]. Addendum by Jetta Lout, RRA on 03/28/18. Electronically Signed   By: Fidela Salisbury M.D.   On: 03/28/2018 10:34   Result Date: 03/28/2018 CLINICAL DATA:  Suspicious masses in the left breast. EXAM: ULTRASOUND GUIDED LEFT BREAST CORE NEEDLE BIOPSIES COMPARISON:  Previous exam(s). FINDINGS: I met with the patient and we discussed the procedure of ultrasound-guided biopsy, including benefits and alternatives. We discussed the high likelihood of a successful procedure. We discussed the risks of the procedure, including infection, bleeding, tissue injury, clip migration, and inadequate sampling. Informed written consent was given. The usual time-out protocol was performed immediately prior to the procedure. Lesion quadrant: Upper outer quadrant Using sterile technique and 1% Lidocaine as local anesthetic, under direct ultrasound visualization, a 14 gauge spring-loaded device was used to perform biopsy of a mass in the 2 o'clock region of the left breast 3 cm from the nipple using a lateral to medial approach. At the conclusion of the procedure a ribbon shaped tissue marker clip was deployed into the biopsy cavity. Follow up 2 view  mammogram was performed and dictated separately. I met with the patient and we discussed the procedure of ultrasound-guided biopsy, including benefits and alternatives. We discussed the high likelihood of a successful procedure. We discussed the risks of the procedure, including infection, bleeding, tissue injury, clip migration, and inadequate sampling. Informed written consent was given. The usual time-out protocol was performed immediately prior to the procedure. Lesion quadrant: Upper outer quadrant Using sterile technique and 1% Lidocaine as local anesthetic, under direct ultrasound visualization, a 14 gauge spring-loaded device was used to perform biopsy of a mass in the 2 o'clock region of the left breast 4 cm from the nipple using a lateral to medial approach. At the conclusion of the procedure a heart shaped tissue marker clip was deployed into the biopsy cavity. Follow up 2 view mammogram was performed and dictated separately. IMPRESSION: Ultrasound guided biopsies of masses in the left breast. No apparent complications. Electronically Signed: By: Lillia Mountain M.D. On: 03/22/2018 11:19    Assessment and plan- Patient is a 57 y.o. 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  Patient was found to have a dominant 0.8 cm grade 1 invasive mammary carcinoma ER PR positive and HER-2/neu negative.  There was an adjacent cluster of nodules about 0.9 cm which was also biopsied and was negative for atypia or malignancy.  The case was discussed at tumor board and consensus was to proceed with an MRI that she had evidence of other hypoechoic masses in her left breast which may be fibrocystic disease the DCIS is not excluded.  Patient is scheduled to undergo the MRI on 04/15/2018 which I will follow-up with.  If  there is no concerning findings for other breast masses which would need another biopsy she can proceed with upfront surgery at this time.  I will see her back about 1 week after surgery to discuss final pathology results and she may need Oncotype testing at that time depending upon the size of the great at the tumor.  I discussed with her what Oncotype testing is and how the results are interpreted.  Patient also has ER positive invasive breast cancer and there would be no role for hormone therapy for at least 5 years which I will also discuss in greater detail after her final pathology is back.  Patient has significant family history as mentioned about one we will refer her to genetic testing for stat BRCA 1 and 2 as that would change her surgical management.  We will also send other genes for testing as well but those results will not change with immediate surgical management.  Also discussed the role for adjuvant radiation after surgery  Treatment will be given with a curative intent  Cancer Staging Malignant neoplasm of left breast in female, estrogen receptor positive ([place]) Staging form: Breast, AJCC 8th Edition - Clinical stage from 04/04/2018: Stage IA (cT1b, cN0, cM0, G1, ER+, PR+, HER2-) - Signed by Sindy Guadeloupe, MD on 04/07/2018     Total face to face encounter time for this patient visit was 40 min. >50% of the time was  spent in counseling and coordination of care.    Thank you for this kind referral and the opportunity to participate in the care of this patient   Visit Diagnosis 1. Malignant neoplasm of left breast in female, estrogen receptor positive, unspecified site of breast ([place])   2. Goals of care, counseling/discussion     Dr. Randa Evens, MD, MPH [facility] [phone] 04/07/2018  9:32 AM

## 2018-04-11 ENCOUNTER — Ambulatory Visit
Admission: RE | Admit: 2018-04-11 | Discharge: 2018-04-11 | Disposition: A | Discharge status: Home / Self Care | Payer: Medicare Other | Source: Ambulatory Visit | Attending: General Surgery | Admitting: General Surgery

## 2018-04-11 DIAGNOSIS — Z170 Estrogen receptor positive status [ER+]: Secondary | ICD-10-CM

## 2018-04-11 DIAGNOSIS — C50211 Malignant neoplasm of upper-inner quadrant of right female breast: Secondary | ICD-10-CM

## 2018-04-11 MED ORDER — GADOBUTROL 1 MMOL/ML IV SOLN
8.0000 mL | Freq: Once | INTRAVENOUS | Status: AC | PRN
  Administered 2018-04-11: 8 mL via INTRAVENOUS

## 2018-04-12 ENCOUNTER — Telehealth: Payer: Self-pay | Admitting: Genetic Counselor

## 2018-04-12 NOTE — Telephone Encounter (Signed)
            Cancer Genetics     Telegenetics Results Disclosure        Patient Name: Tiffany Mathews Patient DOB: [birth year] Patient Age: 57 y.o. Encounter Date: 04/12/2018  Referring Provider: Randa Evens, MD  Reason for Call: Discuss results of genetic testing- 1st of 2 results   This is a brief note to document preliminary genetic test results.  [name] was called today to discuss the first of her genetic test results. Please see the Genetics telephone note from 04/04/2018. Due to time contraints and needing actionable results for medical management, [facility]'s STAT Breast panel was ordered first.  Preliminary Test Results: Genetic testing involved analysis of 9 genes: ATM, BRCA1, BRCA2, CDH1, CHEK2, PALB2, PTEN, STK11 and TP53 genes. Testing was normal and did not reveal a mutation in these genes. Testing is in process for the remaining genes on the Multi-Cancer panel.   Once results are obtained, [name] will be called again. She does not need to wait to proceed with treatment planning.  Steele Berg, MS, [place] Certified Genetic Counselor phone: [phone]

## 2018-04-14 ENCOUNTER — Ambulatory Visit: Payer: Medicare Other | Admitting: Oncology

## 2018-04-18 MED ORDER — CEFAZOLIN SODIUM-DEXTROSE 2-4 GM/100ML-% IV SOLN
2.0000 g | INTRAVENOUS | Status: AC
  Administered 2018-04-19: 2 g via INTRAVENOUS

## 2018-04-19 ENCOUNTER — Ambulatory Visit: Payer: Medicare Other | Admitting: Certified Registered"

## 2018-04-19 ENCOUNTER — Ambulatory Visit
Admission: RE | Admit: 2018-04-19 | Discharge: 2018-04-19 | Disposition: A | Discharge status: Home / Self Care | Payer: Medicare Other | Source: Ambulatory Visit | Attending: General Surgery | Admitting: General Surgery

## 2018-04-19 ENCOUNTER — Encounter
Admission: RE | Disposition: A | Discharge status: Home / Self Care | Payer: Self-pay | Source: Ambulatory Visit | Attending: General Surgery

## 2018-04-19 ENCOUNTER — Other Ambulatory Visit: Payer: Self-pay

## 2018-04-19 DIAGNOSIS — M791 Myalgia, unspecified site: Secondary | ICD-10-CM | POA: Insufficient documentation

## 2018-04-19 DIAGNOSIS — Z823 Family history of stroke: Secondary | ICD-10-CM | POA: Diagnosis not present

## 2018-04-19 DIAGNOSIS — Z803 Family history of malignant neoplasm of breast: Secondary | ICD-10-CM | POA: Diagnosis not present

## 2018-04-19 DIAGNOSIS — I10 Essential (primary) hypertension: Secondary | ICD-10-CM | POA: Insufficient documentation

## 2018-04-19 DIAGNOSIS — Z7982 Long term (current) use of aspirin: Secondary | ICD-10-CM | POA: Insufficient documentation

## 2018-04-19 DIAGNOSIS — Z818 Family history of other mental and behavioral disorders: Secondary | ICD-10-CM | POA: Insufficient documentation

## 2018-04-19 DIAGNOSIS — Z79899 Other long term (current) drug therapy: Secondary | ICD-10-CM | POA: Insufficient documentation

## 2018-04-19 DIAGNOSIS — Z853 Personal history of malignant neoplasm of breast: Secondary | ICD-10-CM | POA: Diagnosis not present

## 2018-04-19 DIAGNOSIS — C50412 Malignant neoplasm of upper-outer quadrant of left female breast: Secondary | ICD-10-CM

## 2018-04-19 DIAGNOSIS — Z9049 Acquired absence of other specified parts of digestive tract: Secondary | ICD-10-CM | POA: Diagnosis not present

## 2018-04-19 DIAGNOSIS — Z833 Family history of diabetes mellitus: Secondary | ICD-10-CM | POA: Insufficient documentation

## 2018-04-19 DIAGNOSIS — Z820 Family history of epilepsy and other diseases of the nervous system: Secondary | ICD-10-CM | POA: Insufficient documentation

## 2018-04-19 DIAGNOSIS — E785 Hyperlipidemia, unspecified: Secondary | ICD-10-CM | POA: Diagnosis not present

## 2018-04-19 DIAGNOSIS — Z87891 Personal history of nicotine dependence: Secondary | ICD-10-CM | POA: Diagnosis not present

## 2018-04-19 DIAGNOSIS — Z8041 Family history of malignant neoplasm of ovary: Secondary | ICD-10-CM | POA: Insufficient documentation

## 2018-04-19 DIAGNOSIS — Q382 Macroglossia: Secondary | ICD-10-CM | POA: Diagnosis not present

## 2018-04-19 DIAGNOSIS — M1990 Unspecified osteoarthritis, unspecified site: Secondary | ICD-10-CM | POA: Insufficient documentation

## 2018-04-19 DIAGNOSIS — Z8261 Family history of arthritis: Secondary | ICD-10-CM | POA: Diagnosis not present

## 2018-04-19 DIAGNOSIS — Z170 Estrogen receptor positive status [ER+]: Principal | ICD-10-CM

## 2018-04-19 DIAGNOSIS — R102 Pelvic and perineal pain: Secondary | ICD-10-CM | POA: Diagnosis not present

## 2018-04-19 DIAGNOSIS — K449 Diaphragmatic hernia without obstruction or gangrene: Secondary | ICD-10-CM | POA: Insufficient documentation

## 2018-04-19 DIAGNOSIS — E041 Nontoxic single thyroid nodule: Secondary | ICD-10-CM | POA: Diagnosis not present

## 2018-04-19 DIAGNOSIS — Z8049 Family history of malignant neoplasm of other genital organs: Secondary | ICD-10-CM | POA: Insufficient documentation

## 2018-04-19 DIAGNOSIS — F319 Bipolar disorder, unspecified: Secondary | ICD-10-CM | POA: Diagnosis not present

## 2018-04-19 DIAGNOSIS — Z8249 Family history of ischemic heart disease and other diseases of the circulatory system: Secondary | ICD-10-CM | POA: Insufficient documentation

## 2018-04-19 DIAGNOSIS — M2550 Pain in unspecified joint: Secondary | ICD-10-CM | POA: Insufficient documentation

## 2018-04-19 DIAGNOSIS — Z808 Family history of malignant neoplasm of other organs or systems: Secondary | ICD-10-CM | POA: Insufficient documentation

## 2018-04-19 DIAGNOSIS — Z800 Family history of malignant neoplasm of digestive organs: Secondary | ICD-10-CM | POA: Insufficient documentation

## 2018-04-19 HISTORY — PX: BREAST LUMPECTOMY: SHX2

## 2018-04-19 HISTORY — PX: SENTINEL NODE BIOPSY: SHX6608

## 2018-04-19 HISTORY — PX: PARTIAL MASTECTOMY WITH NEEDLE LOCALIZATION: SHX6008

## 2018-04-19 SURGERY — PARTIAL MASTECTOMY WITH NEEDLE LOCALIZATION
Anesthesia: General | Site: Breast | Laterality: Left

## 2018-04-19 MED ORDER — FENTANYL CITRATE (PF) 100 MCG/2ML IJ SOLN
INTRAMUSCULAR | Status: AC
  Filled 2018-04-19: qty 2

## 2018-04-19 MED ORDER — LIDOCAINE HCL (CARDIAC) PF 100 MG/5ML IV SOSY
PREFILLED_SYRINGE | INTRAVENOUS | Status: DC | PRN
  Administered 2018-04-19: 100 mg via INTRAVENOUS

## 2018-04-19 MED ORDER — TECHNETIUM TC 99M SULFUR COLLOID FILTERED
1.0000 | Freq: Once | INTRAVENOUS | Status: AC | PRN
  Administered 2018-04-19: 0.641 via INTRADERMAL

## 2018-04-19 MED ORDER — PROPOFOL 10 MG/ML IV BOLUS
INTRAVENOUS | Status: DC | PRN
  Administered 2018-04-19: 200 mg via INTRAVENOUS

## 2018-04-19 MED ORDER — FENTANYL CITRATE (PF) 100 MCG/2ML IJ SOLN
INTRAMUSCULAR | Status: DC | PRN
  Administered 2018-04-19 (×4): 50 ug via INTRAVENOUS

## 2018-04-19 MED ORDER — DEXAMETHASONE SODIUM PHOSPHATE 10 MG/ML IJ SOLN
INTRAMUSCULAR | Status: DC | PRN
  Administered 2018-04-19: 10 mg via INTRAVENOUS

## 2018-04-19 MED ORDER — OXYCODONE HCL 5 MG PO TABS
5.0000 mg | ORAL_TABLET | Freq: Once | ORAL | Status: AC | PRN
  Administered 2018-04-19: 5 mg via ORAL

## 2018-04-19 MED ORDER — HYDROCODONE-ACETAMINOPHEN 5-325 MG PO TABS: 1.0000 | ORAL_TABLET | ORAL | 0 refills | Status: AC | PRN

## 2018-04-19 MED ORDER — PROPOFOL 10 MG/ML IV BOLUS
INTRAVENOUS | Status: AC
  Filled 2018-04-19: qty 40

## 2018-04-19 MED ORDER — CEFAZOLIN SODIUM-DEXTROSE 2-4 GM/100ML-% IV SOLN
INTRAVENOUS | Status: AC
  Filled 2018-04-19: qty 100

## 2018-04-19 MED ORDER — OXYCODONE HCL 5 MG PO TABS
ORAL_TABLET | ORAL | Status: AC
  Filled 2018-04-19: qty 1

## 2018-04-19 MED ORDER — PROMETHAZINE HCL 25 MG/ML IJ SOLN: 6.2500 mg | INTRAMUSCULAR | Status: DC | PRN

## 2018-04-19 MED ORDER — BUPIVACAINE-EPINEPHRINE 0.5% -1:200000 IJ SOLN
INTRAMUSCULAR | Status: DC | PRN
  Administered 2018-04-19: 30 mL

## 2018-04-19 MED ORDER — BUPIVACAINE-EPINEPHRINE (PF) 0.5% -1:200000 IJ SOLN
INTRAMUSCULAR | Status: AC
  Filled 2018-04-19: qty 30

## 2018-04-19 MED ORDER — LACTATED RINGERS IV SOLN
INTRAVENOUS | Status: DC
  Administered 2018-04-19: 10:00:00 via INTRAVENOUS

## 2018-04-19 MED ORDER — FENTANYL CITRATE (PF) 100 MCG/2ML IJ SOLN
INTRAMUSCULAR | Status: AC
  Administered 2018-04-19: 50 ug via INTRAVENOUS
  Filled 2018-04-19: qty 2

## 2018-04-19 MED ORDER — MIDAZOLAM HCL 2 MG/2ML IJ SOLN
INTRAMUSCULAR | Status: DC | PRN
  Administered 2018-04-19: 2 mg via INTRAVENOUS

## 2018-04-19 MED ORDER — PENTAFLUOROPROP-TETRAFLUOROETH EX AERO
INHALATION_SPRAY | CUTANEOUS | Status: AC
  Filled 2018-04-19: qty 116

## 2018-04-19 MED ORDER — MIDAZOLAM HCL 2 MG/2ML IJ SOLN
INTRAMUSCULAR | Status: AC
  Filled 2018-04-19: qty 2

## 2018-04-19 MED ORDER — FENTANYL CITRATE (PF) 100 MCG/2ML IJ SOLN
25.0000 ug | INTRAMUSCULAR | Status: DC | PRN
  Administered 2018-04-19 (×3): 50 ug via INTRAVENOUS

## 2018-04-19 MED ORDER — MEPERIDINE HCL 50 MG/ML IJ SOLN: 6.2500 mg | INTRAMUSCULAR | Status: DC | PRN

## 2018-04-19 MED ORDER — OXYCODONE HCL 5 MG/5ML PO SOLN: 5.0000 mg | Freq: Once | ORAL | Status: AC | PRN

## 2018-04-19 SURGICAL SUPPLY — 39 items
CANISTER SUCT 1200ML W/VALVE (MISCELLANEOUS) ×3 IMPLANT
CHLORAPREP W/TINT 26ML (MISCELLANEOUS) ×3 IMPLANT
CNTNR SPEC 2.5X3XGRAD LEK (MISCELLANEOUS) ×2
CONT SPEC 4OZ STER OR WHT (MISCELLANEOUS) ×4
CONTAINER SPEC 2.5X3XGRAD LEK (MISCELLANEOUS) ×2 IMPLANT
COVER WAND RF STERILE (DRAPES) ×3 IMPLANT
DERMABOND ADVANCED (GAUZE/BANDAGES/DRESSINGS) ×2
DERMABOND ADVANCED .7 DNX12 (GAUZE/BANDAGES/DRESSINGS) ×1 IMPLANT
DEVICE DUBIN SPECIMEN MAMMOGRA (MISCELLANEOUS) ×3 IMPLANT
DRAPE LAPAROTOMY 77X122 PED (DRAPES) ×3 IMPLANT
DRAPE LAPAROTOMY TRNSV 106X77 (MISCELLANEOUS) ×3 IMPLANT
ELECT REM PT RETURN 9FT ADLT (ELECTROSURGICAL) ×3
ELECTRODE REM PT RTRN 9FT ADLT (ELECTROSURGICAL) ×1 IMPLANT
GLOVE BIO SURGEON STRL SZ 6.5 (GLOVE) ×2 IMPLANT
GLOVE BIO SURGEONS STRL SZ 6.5 (GLOVE) ×1
GLOVE BIOGEL M 6.5 STRL (GLOVE) ×3 IMPLANT
GOWN STRL REUS W/ TWL LRG LVL3 (GOWN DISPOSABLE) ×2 IMPLANT
GOWN STRL REUS W/TWL LRG LVL3 (GOWN DISPOSABLE) ×4
KIT TURNOVER KIT A (KITS) ×3 IMPLANT
LABEL OR SOLS (LABEL) ×3 IMPLANT
MARGIN MAP 10MM (MISCELLANEOUS) ×3 IMPLANT
NDL SAFETY ECLIPSE 18X1.5 (NEEDLE) ×1 IMPLANT
NEEDLE HYPO 18GX1.5 SHARP (NEEDLE) ×2
NEEDLE HYPO 22GX1.5 SAFETY (NEEDLE) ×3 IMPLANT
NEEDLE HYPO 25X1 1.5 SAFETY (NEEDLE) ×3 IMPLANT
PACK BASIN MINOR ARMC (MISCELLANEOUS) ×3 IMPLANT
RETRACTOR WOUND ALXS 18CM SML (MISCELLANEOUS) ×1 IMPLANT
RTRCTR WOUND ALEXIS O 18CM SML (MISCELLANEOUS) ×3
SLEVE PROBE SENORX GAMMA FIND (MISCELLANEOUS) ×3 IMPLANT
SUT ETHILON 3-0 FS-10 30 BLK (SUTURE) ×3
SUT MNCRL 4-0 (SUTURE) ×2
SUT MNCRL 4-0 27XMFL (SUTURE) ×1
SUT SILK 2 0 SH (SUTURE) IMPLANT
SUT VIC AB 3-0 SH 27 (SUTURE) ×2
SUT VIC AB 3-0 SH 27X BRD (SUTURE) ×1 IMPLANT
SUTURE EHLN 3-0 FS-10 30 BLK (SUTURE) ×1 IMPLANT
SUTURE MNCRL 4-0 27XMF (SUTURE) ×1 IMPLANT
SYR 10ML LL (SYRINGE) ×3 IMPLANT
WATER STERILE IRR 1000ML POUR (IV SOLUTION) ×3 IMPLANT

## 2018-04-19 NOTE — Anesthesia Post-op Follow-up Note (Signed)
Anesthesia QCDR form completed.        

## 2018-04-19 NOTE — OR Nursing (Signed)
Discharge instructions discussed with pt and husband. Both voice understanding. 

## 2018-04-19 NOTE — Discharge Instructions (Signed)

## 2018-04-19 NOTE — Op Note (Signed)
Preoperative diagnosis: Left breast carcinoma.  Postoperative diagnosis: Left breast carcinoma.   Procedure: Left needle-localized partial mastectomy.                       Left Axillary Sentinel Lymph node biopsy  Anesthesia: GETA  Surgeon: Dr. Windell Moment  Wound Classification: Clean  Indications: Patient is a 57 y.o. female with a nonpalpable left breast mass noted on mammography with core biopsy demonstrating invasive mammary carcinoma requires needle-localized lumpectomy for treatment with sentinel lymph node biopsy.   Findings: 1. Specimen mammography shows both markers and wire on specimen 2. Pathology call refers gross examination of margins was close to the medial margin (3 mm) 3. No other palpable mass or lymph node identified.   Description of procedure: Preoperative needle localization was performed by radiology. In the nuclear medicine suite, the subareolar region was injected with Tc-99 sulfur colloid. Localization studies were reviewed. The patient was taken to the operating room and placed supine on the operating table, and after general anesthesia the left chest and axilla were prepped and draped in the usual sterile fashion. A time-out was completed verifying correct patient, procedure, site, positioning, and implant(s) and/or special equipment prior to beginning this procedure.  By comparing the localization studies with the direction and skin entry site of the needle, the probable trajectory and location of the mass was visualized. A circumareolar skin incision was planned in such a way as to minimize the amount of dissection to reach the mass.  The skin incision was made. Flaps were raised and the location of the wire confirmed. The wire was delivered into the wound. A 2-0 silk figure-of-eight stay suture was placed around the wire and used for retraction. Dissection was then taken down circumferentially, taking care to include the entire localizing needle and a wide  margin of grossly normal tissue. The specimen and entire localizing wire were removed. The specimen was oriented and sent to radiology with the localization studies. Confirmation was received that the entire target lesion had been resected but biopsy changes were close to the medial margin. A re excision of the medial margin was done. The wound was irrigated. Hemostasis was checked. The wound was closed with interrupted sutures of 3-0 Vicryl and a subcuticular suture of Monocryl 3-0. No attempt was made to close the dead space. A dressing was applied.   A hand-held gamma probe was used to identify the location of the hottest spot in the axilla. An incision was made around the caudal axillary hairline. Dissection was carried down until subdermal facias was advanced. The probe was placed and again, the point of maximal count was found. Dissection continue until nodule was identified. The probe was placed in contact with the node and 1559 counts were recorded. The node was excised in its entirety. Ex vivo, the node measured 1638 counts when placed on the probe. An additional hot spot was detected and the node was excised in similar fashion. The following counts registered: 227 ex vivo node, and 8 residual bed. No additional hot spots were identified. No clinically abnormal nodes were palpated. The procedure was terminated. Hemostasis was achieved and the wound closed in layers with deep interrupted 3-0 Vicryl and skin was closed with subcuticular suture of Monocryl 3-0.  The patient tolerated the procedure well and was taken to the postanesthesia care unit in stable condition.   Specimen: Left Breast mass (Orientation markers used: Cranial, Medial, Deep)  Sentinel Lymph nodes #1, #2  Complications: None  Estimated Blood Loss: 5 mL

## 2018-04-19 NOTE — Anesthesia Postprocedure Evaluation (Signed)
Anesthesia Post Note  Patient: Tiffany Mathews  Procedure(s) Performed: PARTIAL MASTECTOMY WITH NEEDLE LOCALIZATION (Left Breast) SENTINEL NODE BIOPSY (Left Breast)  Patient location during evaluation: PACU Anesthesia Type: General Level of consciousness: awake and alert and oriented Pain management: pain level controlled Vital Signs Assessment: post-procedure vital signs reviewed and stable Respiratory status: spontaneous breathing, nonlabored ventilation and respiratory function stable Cardiovascular status: blood pressure returned to baseline and stable Postop Assessment: no signs of nausea or vomiting Anesthetic complications: no     Last Vitals:  Vitals:   04/19/18 1225 04/19/18 1230  BP:  123/73  Pulse: 71 61  Resp: 19 18  Temp:  (!) 36.3 C  SpO2: 100% 100%    Last Pain:  Vitals:   04/19/18 1230  TempSrc:   PainSc: 5                  Lucilia Yanni

## 2018-04-19 NOTE — Interval H&P Note (Signed)
History and Physical Interval Note:  04/19/2018 9:34 AM  Tiffany Mathews  has presented today for surgery, with the diagnosis of MALIGNANT NEOPLASM OF UPPER OUTER QUADRANT OF LEFT BREAST  The various methods of treatment have been discussed with the patient and family. After consideration of risks, benefits and other options for treatment, the patient has consented to  Procedure(s): PARTIAL MASTECTOMY WITH NEEDLE LOCALIZATION (Left) SENTINEL NODE BIOPSY (Left) as a surgical intervention .  The patient's history has been reviewed, patient examined, no change in status, stable for surgery.  I have reviewed the patient's chart and labs.  Left side marked in the pre procedure room. Questions were answered to the patient's satisfaction.     Herbert Pun

## 2018-04-19 NOTE — Transfer of Care (Signed)
Immediate Anesthesia Transfer of Care Note  Patient: Tiffany Mathews  Procedure(s) Performed: PARTIAL MASTECTOMY WITH NEEDLE LOCALIZATION (Left Breast) SENTINEL NODE BIOPSY (Left Breast)  Patient Location: PACU  Anesthesia Type:General  Level of Consciousness: awake, alert  and oriented  Airway & Oxygen Therapy: Patient Spontanous Breathing and Patient connected to face mask oxygen  Post-op Assessment: Report given to RN and Post -op Vital signs reviewed and stable  Post vital signs: Reviewed and stable  Last Vitals:  Vitals Value Taken Time  BP 117/64 04/19/2018 11:29 AM  Temp 35.7 C 04/19/2018 11:29 AM  Pulse 88 04/19/2018 11:34 AM  Resp 17 04/19/2018 11:34 AM  SpO2 100 % 04/19/2018 11:34 AM  Vitals shown include unvalidated device data.  Last Pain:  Vitals:   04/19/18 1129  TempSrc:   PainSc: Asleep         Complications: No apparent anesthesia complications

## 2018-04-19 NOTE — Anesthesia Preprocedure Evaluation (Signed)
Anesthesia Evaluation  Patient identified by MRN, date of birth, ID band Patient awake    Reviewed: Allergy & Precautions, NPO status , Patient's Chart, lab work & pertinent test results  History of Anesthesia Complications Negative for: history of anesthetic complications  Airway Mallampati: III  TM Distance: <3 FB Neck ROM: Full    Dental no notable dental hx.    Pulmonary sleep apnea (does not tolerate CPAP) , neg COPD, Current Smoker,    breath sounds clear to auscultation- rhonchi (-) wheezing      Cardiovascular hypertension, Pt. on medications (-) CAD, (-) Past MI, (-) Cardiac Stents and (-) CABG  Rhythm:Regular Rate:Normal - Systolic murmurs and - Diastolic murmurs    Neuro/Psych PSYCHIATRIC DISORDERS Anxiety Depression Bipolar Disorder negative neurological ROS     GI/Hepatic Neg liver ROS, hiatal hernia, GERD  ,  Endo/Other  negative endocrine ROSneg diabetes  Renal/GU negative Renal ROS     Musculoskeletal  (+) Arthritis , Fibromyalgia -  Abdominal (+) + obese,   Peds  Hematology negative hematology ROS (+)   Anesthesia Other Findings Past Medical History: No date: Anxiety No date: Arthritis No date: Bipolar 1 disorder (HCC) No date: Breast cancer (HCC) No date: Depression No date: Fibromyalgia No date: GERD (gastroesophageal reflux disease) No date: History of hiatal hernia No date: Hyperlipidemia No date: Hypertension No date: Multiple thyroid nodules     Comment:  benign No date: Sleep apnea   Reproductive/Obstetrics                             Anesthesia Physical Anesthesia Plan  ASA: II  Anesthesia Plan: General   Post-op Pain Management:    Induction: Intravenous  PONV Risk Score and Plan: 1 and Ondansetron and Midazolam  Airway Management Planned: LMA  Additional Equipment:   Intra-op Plan:   Post-operative Plan:   Informed Consent: I have  reviewed the patients History and Physical, chart, labs and discussed the procedure including the risks, benefits and alternatives for the proposed anesthesia with the patient or authorized representative who has indicated his/her understanding and acceptance.   Dental advisory given  Plan Discussed with: CRNA and Anesthesiologist  Anesthesia Plan Comments:         Anesthesia Quick Evaluation

## 2018-04-19 NOTE — Anesthesia Procedure Notes (Signed)
Procedure Name: LMA Insertion Date/Time: 04/19/2018 9:42 AM Performed by: Philbert Riser, CRNA Pre-anesthesia Checklist: Patient identified, Emergency Drugs available, Suction available, Patient being monitored and Timeout performed Patient Re-evaluated:Patient Re-evaluated prior to induction Oxygen Delivery Method: Circle system utilized and Simple face mask Preoxygenation: Pre-oxygenation with 100% oxygen Induction Type: IV induction Ventilation: Mask ventilation without difficulty LMA Size: 4.0 Placement Confirmation: CO2 detector and positive ETCO2 Dental Injury: Teeth and Oropharynx as per pre-operative assessment

## 2018-04-20 ENCOUNTER — Encounter: Payer: Self-pay | Admitting: General Surgery

## 2018-04-20 ENCOUNTER — Ambulatory Visit: Payer: Medicare Other

## 2018-04-21 LAB — SURGICAL PATHOLOGY

## 2018-04-25 ENCOUNTER — Inpatient Hospital Stay: Payer: Medicare Other | Attending: Oncology | Admitting: Oncology

## 2018-04-25 ENCOUNTER — Encounter: Payer: Self-pay | Admitting: *Deleted

## 2018-04-25 ENCOUNTER — Encounter: Payer: Self-pay | Admitting: Oncology

## 2018-04-25 VITALS — BP 138/83 | HR 76 | Temp 98.5°F | Resp 18 | Ht 65.0 in | Wt 179.9 lb

## 2018-04-25 DIAGNOSIS — K219 Gastro-esophageal reflux disease without esophagitis: Secondary | ICD-10-CM | POA: Diagnosis not present

## 2018-04-25 DIAGNOSIS — Z170 Estrogen receptor positive status [ER+]: Secondary | ICD-10-CM | POA: Diagnosis not present

## 2018-04-25 DIAGNOSIS — Z803 Family history of malignant neoplasm of breast: Secondary | ICD-10-CM

## 2018-04-25 DIAGNOSIS — E785 Hyperlipidemia, unspecified: Secondary | ICD-10-CM | POA: Insufficient documentation

## 2018-04-25 DIAGNOSIS — Z7982 Long term (current) use of aspirin: Secondary | ICD-10-CM | POA: Insufficient documentation

## 2018-04-25 DIAGNOSIS — G4730 Sleep apnea, unspecified: Secondary | ICD-10-CM | POA: Diagnosis not present

## 2018-04-25 DIAGNOSIS — Z800 Family history of malignant neoplasm of digestive organs: Secondary | ICD-10-CM | POA: Insufficient documentation

## 2018-04-25 DIAGNOSIS — I10 Essential (primary) hypertension: Secondary | ICD-10-CM | POA: Diagnosis not present

## 2018-04-25 DIAGNOSIS — M129 Arthropathy, unspecified: Secondary | ICD-10-CM | POA: Insufficient documentation

## 2018-04-25 DIAGNOSIS — Z79899 Other long term (current) drug therapy: Secondary | ICD-10-CM | POA: Diagnosis not present

## 2018-04-25 DIAGNOSIS — F319 Bipolar disorder, unspecified: Secondary | ICD-10-CM | POA: Diagnosis not present

## 2018-04-25 DIAGNOSIS — F17210 Nicotine dependence, cigarettes, uncomplicated: Secondary | ICD-10-CM | POA: Insufficient documentation

## 2018-04-25 DIAGNOSIS — E042 Nontoxic multinodular goiter: Secondary | ICD-10-CM | POA: Diagnosis not present

## 2018-04-25 DIAGNOSIS — K449 Diaphragmatic hernia without obstruction or gangrene: Secondary | ICD-10-CM | POA: Diagnosis not present

## 2018-04-25 DIAGNOSIS — C50412 Malignant neoplasm of upper-outer quadrant of left female breast: Secondary | ICD-10-CM | POA: Diagnosis not present

## 2018-04-25 DIAGNOSIS — C50912 Malignant neoplasm of unspecified site of left female breast: Secondary | ICD-10-CM

## 2018-04-25 DIAGNOSIS — Z7189 Other specified counseling: Secondary | ICD-10-CM

## 2018-04-25 NOTE — Progress Notes (Signed)
Pt here post surgery and has not seen surgeon post op yet. She did get report and thought it was good. Tender to the surgery area.

## 2018-04-25 NOTE — Progress Notes (Signed)
  Oncology Nurse Navigator Documentation  Navigator Location: [facility] (04/25/18 1500)   )Navigator Encounter Type: Clinic/MDC (04/25/18 1500)                     Patient Visit Type: MedOnc (04/25/18 1500)   Barriers/Navigation Needs: Financial (04/25/18 1500)   Interventions: Referrals (04/25/18 1500) Referrals: Financial Counseling;Spiritual care;Other (04/25/18 1500)      Support Groups/Services: Breast Support Group (04/25/18 1500)             Time Spent with Patient: 15 (04/25/18 1500)   Met with patient today after her visit with Dr. Janese Banks.  Patient states she is on a fixed income and may need support with gas to her appointments.  Judeen Hammans gave patient a Psychologist, educational.  Informed patient of our [employer] and that we could assist if she needed help with her power bill, food, transportation, etc.  States she feels "not focused".  [place] she needed support.  Discussed our breast cancer support group, and our survivorship programs.  Also discussed talking with our counselor.  Hand-out given on how to contact Janeann Merl, [place] for counseling.  She is to call if she has any questions or needs.

## 2018-04-25 NOTE — Progress Notes (Signed)
Hematology/Oncology Consult note [facility]  Telephone:([phone] Fax:([phone]  Patient Care Team: Cletis Athens, MD as PCP - General (Internal Medicine)   Name of the patient: Tiffany Mathews  [id number]  [birth year]   Date of visit: 04/25/18  Diagnosis-stage IA invasive mammary carcinoma pathological prognostic stage pT1b pN0cM0 ER PR positive HER-2/neu negative  Chief complaint/ Reason for visit-discuss results of biopsy and further management  Heme/Onc history: patient is a 57 year old Hispanic female who underwent bilateral screening mammogram in September 2019 which showed a suspicious mass in the left breast 2 o'clock position measuring 0.8 x 0.5 x 0.8 cm in size.  Approximately 1.2 cm lateral to this mass were 2-3 tightly clustered hypoechoic nodules together measuring 29 x 0.2 x 0.8 cm.  There was also an other hypoechoic area which was suggestive of fibrocystic tissue.  Several other tiny hypoechoic masses were noted with prominent ducts which could represent fibrocystic change.  DCIS is also a concern.  Ultrasound of the left axilla did not reveal any pathologic adenopathy.  Patient underwent core biopsy of the dominant mass as well as the cluster of nodules immediately adjacent to it.  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  Biopsy of the second breast mass was negative for atypia and malignancy.    MRI of bilateral breasts on 04/11/2018 showed 0.8 cm biopsy-proven malignancy in the upper outer quadrant of the left breast.  Second mass in the upper outer quadrant of the left breast which is known and pathology to be benign and enhances to a lesser degree than does the biopsy malignancy.  Patient underwent lumpectomy and sentinel lymph node biopsy on 04/19/2018.  Final pathology showed invasive mammary carcinoma, grade 1, 10 mm. 2 sentinel lymph nodes were  negative for malignancy.  Negative margins.  ER PR greater than 90% positive.  HER-2/neu negative.  pT1b pN0  Patient did undergo genetic testing which did not reveal any mutation in the 9 genes:ATM, BRCA1, BRCA2, CDH1, CHEK2, PALB2, PTEN, STK11 and TP53 genes.  Testing of the remaining genes on the multi cancer panel is currently pending  Interval history-she is recovering well from her surgery and reports no specific complaints today  ECOG PS- 0 Pain scale- 0 Opioid associated constipation- no  Review of systems- Review of Systems  Constitutional: Negative for chills, fever, malaise/fatigue and weight loss.  HENT: Negative for congestion, ear discharge and nosebleeds.   Eyes: Negative for blurred vision.  Respiratory: Negative for cough, hemoptysis, sputum production, shortness of breath and wheezing.   Cardiovascular: Negative for chest pain, palpitations, orthopnea and claudication.  Gastrointestinal: Negative for abdominal pain, blood in stool, constipation, diarrhea, heartburn, melena, nausea and vomiting.  Genitourinary: Negative for dysuria, flank pain, frequency, hematuria and urgency.  Musculoskeletal: Negative for back pain, joint pain and myalgias.  Skin: Negative for rash.  Neurological: Negative for dizziness, tingling, focal weakness, seizures, weakness and headaches.  Endo/Heme/Allergies: Does not bruise/bleed easily.  Psychiatric/Behavioral: Negative for depression and suicidal ideas. The patient does not have insomnia.        Allergies  Allergen Reactions  . Lamictal [Lamotrigine] Rash and Other (See Comments)  . Depakote [Divalproex Sodium] Other (See Comments)    Severe hair loss  . Lithium Other (See Comments)    Unknown; "It just made me feel bad."     Past Medical History:  Diagnosis Date  . Anxiety   .  Arthritis   . Bipolar 1 disorder ([place])   . Breast cancer ([place])   . Depression   . Fibromyalgia   . GERD (gastroesophageal reflux disease)   .  History of hiatal hernia   . Hyperlipidemia   . Hypertension   . Multiple thyroid nodules    benign  . Sleep apnea      Past Surgical History:  Procedure Laterality Date  . ABLATION  04/2011  . BREAST BIOPSY Right 2009   -  . BREAST LUMPECTOMY Left 04/19/2018  . CESAREAN SECTION     x3  . CHOLECYSTECTOMY    . GALLBLADDER SURGERY    . LYSIS OF ADHESION  05/27/2016   Procedure: LYSIS OF ADHESION;  Surgeon: Clayburn Pert, MD;  Location: [facility] ORS;  Service: General;;  . PARTIAL MASTECTOMY WITH NEEDLE LOCALIZATION Left 04/19/2018   Procedure: PARTIAL MASTECTOMY WITH NEEDLE LOCALIZATION;  Surgeon: Herbert Pun, MD;  Location: [facility] ORS;  Service: General;  Laterality: Left;  . ROBOTIC ASSISTED LAPAROSCOPIC CHOLECYSTECTOMY N/A 05/27/2016   Procedure: ROBOTIC ASSISTED LAPAROSCOPIC CHOLECYSTECTOMY;  Surgeon: Clayburn Pert, MD;  Location: [facility] ORS;  Service: General;  Laterality: N/A;  . SENTINEL NODE BIOPSY Left 04/19/2018   Procedure: SENTINEL NODE BIOPSY;  Surgeon: Herbert Pun, MD;  Location: [facility] ORS;  Service: General;  Laterality: Left;  . SHOULDER ARTHROSCOPY  2000  . TUBAL LIGATION      Social History   Socioeconomic History  . Marital status: Divorced    Spouse name: Not on file  . Number of children: Not on file  . Years of education: Not on file  . Highest education level: Not on file  Occupational History  . Not on file  Social Needs  . Financial resource strain: Not on file  . Food insecurity:    Worry: Not on file    Inability: Not on file  . Transportation needs:    Medical: Not on file    Non-medical: Not on file  Tobacco Use  . Smoking status: Current Every Day Smoker    Packs/day: 1.00    Years: 20.00    Pack years: 20.00    Last attempt to quit: 2016    Years since quitting: 3.8  . Smokeless tobacco: Never Used  Substance and Sexual Activity  . Alcohol use: No  . Drug use: No  . Sexual activity: Not on file  Lifestyle  .  Physical activity:    Days per week: Not on file    Minutes per session: Not on file  . Stress: Not on file  Relationships  . Social connections:    Talks on phone: Not on file    Gets together: Not on file    Attends religious service: Not on file    Active member of club or organization: Not on file    Attends meetings of clubs or organizations: Not on file    Relationship status: Not on file  . Intimate partner violence:    Fear of current or ex partner: Not on file    Emotionally abused: Not on file    Physically abused: Not on file    Forced sexual activity: Not on file  Other Topics Concern  . Not on file  Social History Narrative  . Not on file    Family History  Problem Relation Age of Onset  . Thyroid cancer Mother        dx 85s; currently 70  . Hypertension Mother   . Diabetes Mother   .  Breast cancer Maternal Aunt 82       deceased 70s  . Alzheimer's disease Father        deceased 27  . Stroke Father   . Drug abuse Sister        deceased 77  . Diabetes Brother   . Hypertension Brother   . Heart attack Maternal Grandfather   . Diabetes Brother   . Hypertension Brother   . Alcohol abuse Brother   . Schizophrenia Brother   . Stomach cancer Paternal Aunt        age at dx unknown  . Stomach cancer Paternal Uncle        age at dx unknown  . Cancer Other        daughter of sister who died at 59; breast ca in 48s and ovarian cancer at 70; currently 39s     Current Outpatient Medications:  .  aspirin EC 81 MG tablet, Take by mouth., Disp: , Rfl:  .  atorvastatin (LIPITOR) 40 MG tablet, Take 40 mg by mouth daily. , Disp: , Rfl:  .  clonazePAM (KLONOPIN) 0.5 MG tablet, Take 0.5 mg by mouth 2 (two) times daily. , Disp: , Rfl:  .  cyclobenzaprine (FLEXERIL) 10 MG tablet, Take 10 mg by mouth at bedtime. , Disp: , Rfl:  .  DULoxetine (CYMBALTA) 20 MG capsule, Take 20 mg by mouth daily., Disp: , Rfl:  .  gabapentin (NEURONTIN) 300 MG capsule, Take 300 mg by mouth  3 (three) times daily. , Disp: , Rfl: 1 .  loratadine (CLARITIN) 10 MG tablet, Take 10 mg by mouth daily as needed for allergies., Disp: , Rfl: 3 .  losartan (COZAAR) 100 MG tablet, Take 100 mg by mouth 2 (two) times daily. , Disp: , Rfl:  .  Multiple Vitamins-Minerals (MULTIVITAMIN WITH MINERALS) tablet, Take 1 tablet by mouth daily., Disp: , Rfl:  .  pantoprazole (PROTONIX) 40 MG tablet, Take 1 tablet (40 mg total) by mouth daily., Disp: 90 tablet, Rfl: 3 .  traZODone (DESYREL) 100 MG tablet, Take 100 mg by mouth at bedtime as needed (sleep). , Disp: , Rfl: 1 .  triamcinolone cream (KENALOG) 0.1 %, Apply 1 application topically 2 (two) times daily as needed (for foot rash). , Disp: , Rfl:  .  ziprasidone (GEODON) 40 MG capsule, Take 40 mg by mouth every evening. , Disp: , Rfl:   Physical exam:  Vitals:   04/25/18 1413  BP: 138/83  Pulse: 76  Resp: 18  Temp: 98.5 F (36.9 C)  TempSrc: Oral  Weight: 179 lb 14.4 oz (81.6 kg)  Height: _0  (1.651 m)   Physical Exam  Constitutional: She is oriented to person, place, and time. She appears well-developed and well-nourished.  HENT:  Head: Normocephalic and atraumatic.  Eyes: Pupils are equal, round, and reactive to light. EOM are normal.  Neck: Normal range of motion.  Cardiovascular: Normal rate, regular rhythm and normal heart sounds.  Pulmonary/Chest: Effort normal and breath sounds normal.  Abdominal: Soft. Bowel sounds are normal.  Neurological: She is alert and oriented to person, place, and time.  Skin: Skin is warm and dry.  Patient is status post left lumpectomy and sentinel lymph node biopsy with a well-healed surgical scar.  CMP Latest Ref Rng & Units 07/10/2015  Glucose 65 - 99 mg/dL 123(H)  BUN 6 - 20 mg/dL 14  Creatinine 0.44 - 1.00 mg/dL 0.83  Sodium 135 - 145 mmol/L 141  Potassium 3.5 -  5.1 mmol/L 3.7  Chloride 101 - 111 mmol/L 110  CO2 22 - 32 mmol/L 26  Calcium 8.9 - 10.3 mg/dL 8.8(L)   CBC Latest Ref Rng &  Units 07/10/2015  WBC 3.6 - 11.0 K/uL 8.5  Hemoglobin 12.0 - 16.0 g/dL 14.0  Hematocrit 35.0 - 47.0 % 41.5  Platelets 150 - 440 K/uL 351    No images are attached to the encounter.  Mr Breast Bilateral W Wo Contrast Inc Cad  Result Date: 04/12/2018 CLINICAL DATA:  Recent diagnosis of invasive mammary carcinoma following ultrasound-guided biopsy of a left breast mass at 2 o'clock position 3 cm from the nipple. A second ultrasound-guided biopsy of the left breast performed the same day, of a mass at 2 o'clock position 4 cm from the nipple showed benign pathology, including pseudoangiomatous stromal hyperplasia. LABS:  Creatinine was obtained on site at [facility] at [address] Results: Creatinine 0.8 mg/dL. EXAM: BILATERAL BREAST MRI WITH AND WITHOUT CONTRAST TECHNIQUE: Multiplanar, multisequence MR images of both breasts were obtained prior to and following the intravenous administration of 8 ml of Gadavist. Three-dimensional MR images were rendered by post-processing of the original MR data on an independent workstation. The three-dimensional MR images were interpreted, and findings are reported in the following complete MRI report for this study. Three dimensional images were evaluated at the independent DynaCad workstation COMPARISON:  Previous exam(s) at the [facility] in [place]. FINDINGS: Breast composition: b. Scattered fibroglandular tissue. Background parenchymal enhancement: Mild Right breast: No mass or abnormal enhancement. Left breast: In the middle third of the upper-outer left breast is an irregular 0.8 cm enhancing mass with persistent kinetics and associated biopsy clip artifact. This corresponds to the biopsy-proven malignancy in the 2 o'clock position of the left breast 3 cm from the nipple. Slightly inferiorly in the same 2 o'clock position of the left breast is a second mass that enhances to a lesser degree and contains central biopsy clip artifact.  This mass measures approximately 0.9 cm, and is felt to correspond to the biopsy-proven pseudoangiomatous stromal hyperplasia. Minimal central enhancement with persistent type kinetics is seen at this biopsy site. Lymph nodes: No abnormal appearing lymph nodes. Ancillary findings:  None. IMPRESSION: 1. 0.8 cm biopsy-proven malignancy in the upper-outer left breast with central biopsy clip artifact. 2. A second mass in the upper outer left breast with central biopsy clip artifact was shown at pathology to be benign, and enhances to a lesser degree than does the biopsied malignancy. 3. No additional suspicious areas of enhancement in the left breast to suggest multifocal disease. 4. No MRI evidence of malignancy in the right breast. RECOMMENDATION: Continued surgical planning for known solitary left breast cancer upper outer quadrant left breast. BI-RADS CATEGORY  6: Known biopsy-proven malignancy. Electronically Signed   By: Curlene Dolphin M.D.   On: 04/12/2018 11:58   Nm Sentinel Node Injection  Addendum Date: 04/19/2018   ADDENDUM REPORT: 04/19/2018 08:50 ADDENDUM: RADIOPHARMACEUTICALS:   Total of 4.481 millicurie Millipore-filtered Technetium-45msulfur colloid, injected in four aliquots of 0.25 mCi Electronically Signed   By: AMarybelle KillingsM.D.   On: 04/19/2018 08:50   Result Date: 04/19/2018 CLINICAL DATA:  Left breast cancer. EXAM: NUCLEAR MEDICINE BREAST LYMPHOSCINTIGRAPHY TECHNIQUE: Intradermal injection of radiopharmaceutical was performed at the 12 o'clock, 3 o'clock, 6 o'clock, and 9 o'clock positions around the left nipple. The patient was then sent to the operating room where the sentinel node(s) were identified and removed by the surgeon.  RADIOPHARMACEUTICALS:  Total of 1 mCi Millipore-filtered Technetium-36msulfur colloid, injected in four aliquots of 0.25 mCi each. IMPRESSION: Uncomplicated intradermal injection of a total of 1 mCi Technetium-968mulfur colloid for purposes of sentinel node  identification. Electronically Signed: By: ArMarybelle Killings.D. On: 04/19/2018 08:21   Mm Breast Surgical Specimen  Result Date: 04/19/2018 CLINICAL DATA:  Biopsy proven left breast invasive mammary carcinoma. EXAM: SPECIMEN RADIOGRAPH OF THE LEFT BREAST COMPARISON:  Previous exam(s). FINDINGS: Status post excision of the left breast. The wire tip and biopsy marker clip are present and are marked for pathology. IMPRESSION: Specimen radiograph of the left breast. Electronically Signed   By: DiLillia Mountain.D.   On: 04/19/2018 10:36   Mm Lt Plc Breast Loc Dev   1st Lesion  Inc Mammo Guide  Result Date: 04/19/2018 CLINICAL DATA:  Biopsy proven invasive mammary carcinoma in the left breast. EXAM: NEEDLE LOCALIZATION OF THE LEFT BREAST WITH MAMMO GUIDANCE COMPARISON:  Previous exams. FINDINGS: Patient presents for needle localization prior to surgery. I met with the patient and we discussed the procedure of needle localization including benefits and alternatives. We discussed the high likelihood of a successful procedure. We discussed the risks of the procedure, including infection, bleeding, tissue injury, and further surgery. Informed, written consent was given. The usual time-out protocol was performed immediately prior to the procedure. Using mammographic guidance, sterile technique, 1% lidocaine and a #5 modified Kopans needle, the ribbon shaped clip was localized using lateral to medial approach. The images were marked for Dr. CiZachery DauerIMPRESSION: Needle localization the left breast. No apparent complications. Electronically Signed   By: DiLillia Mountain.D.   On: 04/19/2018 09:05     Assessment and plan- Patient is a [age].o. female with newly diagnosed invasive mammary carcinoma of the left breast pathological prognostic stage Ia pT1b pN0 cM0 ER PR positive HER-2/neu negative status post lumpectomy  I discussed the results of the final pathology with the patient in detail which shows 10 mm invasive  mammary carcinoma, grade 1 ER PR strongly positive and HER-2/neu negative.  2 sentinel lymph nodes were negative for malignancy and margins were also negative.  At this time she would need Oncotype testing to determine if she would benefit from adjuvant chemotherapy or not.  Discussed what Oncotype testing is and how the results are interpreted.  Given that she is more than [age]ears of age, she will only need chemotherapy if her Oncotype score is 26 or higher.  She will not need chemotherapy for a score between 0-25 and she falls in the low or intermediate risk group.  We will send off Oncotype testing today and I will see her tentatively in 2 weeks time to discuss the results of the testing and further management.  Patient will also see radiation oncology the same day and if her Oncotype testing determines that she does not need chemotherapy she can proceed for radiation at that time.  Patient will benefit from 5 years of endocrine therapy with an AI and I will discuss this in more detail at her next visit.  Treatment will be given with a curative intent  Cancer Staging Malignant neoplasm of left breast in female, estrogen receptor positive (HCMondoviStaging form: Breast, AJCC 8th Edition - Clinical stage from 04/04/2018: Stage IA (cT1b, cN0, cM0, G1, ER+, PR+, HER2-) - Signed by RaSindy GuadeloupeMD on 04/07/2018 - Pathologic stage from 04/25/2018: Stage IA (pT1b, pN0, cM0, G1, ER+, PR+, HER2-) - Signed by  Sindy Guadeloupe, MD on 04/25/2018     Visit Diagnosis 1. Malignant neoplasm of left breast in female, estrogen receptor positive, unspecified site of breast ([place])   2. Goals of care, counseling/discussion      Dr. Randa Evens, MD, MPH [facility] at [facility] [phone] 04/25/2018 2:34 PM

## 2018-05-01 ENCOUNTER — Telehealth: Payer: Self-pay | Admitting: Genetic Counselor

## 2018-05-01 ENCOUNTER — Encounter (HOSPITAL_COMMUNITY): Payer: Self-pay

## 2018-05-01 NOTE — Progress Notes (Signed)
Social worker provided counseling services to patient at the cancer center. Will call to make another appointment.

## 2018-05-01 NOTE — Telephone Encounter (Signed)
Cancer Genetics             Telegenetics Results Disclosure   Patient Name: Tiffany Mathews Patient DOB: 06-15-1960 Patient Age: 57 y.o. Phone Call Date: 05/01/2018  Referring Provider: Randa Evens, MD    Ms. Mcmasters was called today to discuss genetic test results. Her phone number did not work. A message was left for her mother, Karalee Height, asking for Ms. Daddario to call me back.  Please see the Genetics telephone note from 04/04/2018 for a detailed discussion of her personal and family histories and the recommendations provided.  Genetic Testing: At the time of Ms. Chisum's telegenetics visit, she decided to pursue genetic testing of 9 genes that may be used to help guide treatment decisions. Once that test was completed, additional genes on a larger panel were analyzed.  Testing which included sequencing and deletion/duplication analysis. Testing did not reveal a pathogenic mutation in any of the genes analyzed. A copy of the genetic test report will be scanned into Epic under the Media tab.  Interpretation: These results suggest that Ms. Rini's cancer was most likely not due to an inherited predisposition. Most cancers happen by chance and this test, along with details of her family history, suggests that her cancer falls into this category.  Since the current test is not perfect, it is possible that there may be a gene mutation that current testing cannot detect, but that chance is small. It is possible that a different genetic factor, which has not yet been discovered or is not on this panel, is responsible for the cancer diagnoses in the family. Again, the likelihood of this is low. No additional testing is recommended at this time for Ms. Rosendo Gros.  Genes Analyzed: The genes analyzed were the 84 genes on [facility]'s Multi-Cancer panel (AIP, ALK, APC, ATM, AXIN2, BAP1, BARD1, BLM, BMPR1A, BRCA1, BRCA2, BRIP1, CASR, CDC73, CDH1, CDK4, CDKN1B, CDKN1C, CDKN2A,  CEBPA, CHEK2, CTNNA1, DICER1, DIS3L2, EGFR, EPCAM, FH, FLCN, GATA2, GPC3, GREM1, HOXB13, HRAS, KIT, MAX, MEN1, MET, MITF, MLH1, MSH2, MSH3, MSH6, MUTYH, NBN, NF1, NF2, NTHL1, PALB2, PDGFRA, PHOX2B, PMS2, POLD1, POLE, POT1, PRKAR1A, PTCH1, PTEN, RAD50, RAD51C, RAD51D, RB1, RECQL4, RET, RUNX1, SDHA, SDHAF2, SDHB, SDHC, SDHD, SMAD4, SMARCA4, SMARCB1, SMARCE1, STK11, SUFU, TERC, TERT, TMEM127, TP53, TSC1, TSC2, VHL, WRN, WT1).  Cancer Screening: Ms. Crossin is recommended to follow the cancer screening guidelines provided by her physicians.   Family Members: Family members are at some increased risk of developing cancer, over the general population risk, simply due to the family history. They are recommended to speak with their own providers about appropriate cancer screenings.   Any relative who had cancer at a young age or had a particularly rare cancer may also wish to pursue genetic testing. This is particularly important for her niece who reportedly had both breast and ovarian cancers. Genetic counselors can be located in other cities, by visiting the website of the [employer] (Artist[employer].se) and Field seismologist for a Dietitian by zip code.   Follow-Up: Cancer genetics is a rapidly advancing field and it is possible that new genetic tests will be appropriate for Ms. Resor in the future. Ms. Whitner is encouraged to remain in contact with Genetics on an annual basis so we can update her personal and family histories, and let her know of advances in cancer genetics that may benefit the  family. Ms. Michalik's questions were answered to her satisfaction today, and she knows she is welcome to call anytime with additional questions.    Steele Berg, MS, [place] Certified Genetic Counselor phone: [phone]

## 2018-05-03 ENCOUNTER — Encounter: Payer: Self-pay | Admitting: Oncology

## 2018-05-11 ENCOUNTER — Inpatient Hospital Stay: Payer: Medicare Other | Attending: Oncology | Admitting: Oncology

## 2018-05-11 ENCOUNTER — Encounter: Payer: Self-pay | Admitting: Radiation Oncology

## 2018-05-11 ENCOUNTER — Telehealth: Payer: Self-pay | Admitting: *Deleted

## 2018-05-11 ENCOUNTER — Encounter: Payer: Self-pay | Admitting: Oncology

## 2018-05-11 ENCOUNTER — Other Ambulatory Visit: Payer: Self-pay

## 2018-05-11 ENCOUNTER — Ambulatory Visit
Admission: RE | Admit: 2018-05-11 | Discharge: 2018-05-11 | Disposition: A | Discharge status: Home / Self Care | Payer: Medicare Other | Source: Ambulatory Visit | Attending: Radiation Oncology | Admitting: Radiation Oncology

## 2018-05-11 VITALS — BP 141/92 | HR 86 | Temp 95.7°F | Resp 18 | Wt 182.7 lb

## 2018-05-11 DIAGNOSIS — Z803 Family history of malignant neoplasm of breast: Secondary | ICD-10-CM | POA: Insufficient documentation

## 2018-05-11 DIAGNOSIS — F319 Bipolar disorder, unspecified: Secondary | ICD-10-CM | POA: Insufficient documentation

## 2018-05-11 DIAGNOSIS — Z79899 Other long term (current) drug therapy: Secondary | ICD-10-CM | POA: Diagnosis not present

## 2018-05-11 DIAGNOSIS — Z170 Estrogen receptor positive status [ER+]: Secondary | ICD-10-CM | POA: Insufficient documentation

## 2018-05-11 DIAGNOSIS — I10 Essential (primary) hypertension: Secondary | ICD-10-CM | POA: Insufficient documentation

## 2018-05-11 DIAGNOSIS — Z79811 Long term (current) use of aromatase inhibitors: Secondary | ICD-10-CM | POA: Diagnosis not present

## 2018-05-11 DIAGNOSIS — Z7982 Long term (current) use of aspirin: Secondary | ICD-10-CM | POA: Insufficient documentation

## 2018-05-11 DIAGNOSIS — G4730 Sleep apnea, unspecified: Secondary | ICD-10-CM | POA: Diagnosis not present

## 2018-05-11 DIAGNOSIS — M797 Fibromyalgia: Secondary | ICD-10-CM | POA: Insufficient documentation

## 2018-05-11 DIAGNOSIS — Z800 Family history of malignant neoplasm of digestive organs: Secondary | ICD-10-CM | POA: Insufficient documentation

## 2018-05-11 DIAGNOSIS — F419 Anxiety disorder, unspecified: Secondary | ICD-10-CM | POA: Insufficient documentation

## 2018-05-11 DIAGNOSIS — Z9012 Acquired absence of left breast and nipple: Secondary | ICD-10-CM | POA: Insufficient documentation

## 2018-05-11 DIAGNOSIS — C50412 Malignant neoplasm of upper-outer quadrant of left female breast: Secondary | ICD-10-CM | POA: Diagnosis not present

## 2018-05-11 DIAGNOSIS — E785 Hyperlipidemia, unspecified: Secondary | ICD-10-CM | POA: Diagnosis not present

## 2018-05-11 DIAGNOSIS — F329 Major depressive disorder, single episode, unspecified: Secondary | ICD-10-CM | POA: Diagnosis not present

## 2018-05-11 DIAGNOSIS — Z7189 Other specified counseling: Secondary | ICD-10-CM

## 2018-05-11 DIAGNOSIS — Z808 Family history of malignant neoplasm of other organs or systems: Secondary | ICD-10-CM | POA: Diagnosis not present

## 2018-05-11 DIAGNOSIS — M129 Arthropathy, unspecified: Secondary | ICD-10-CM | POA: Diagnosis not present

## 2018-05-11 DIAGNOSIS — K219 Gastro-esophageal reflux disease without esophagitis: Secondary | ICD-10-CM | POA: Insufficient documentation

## 2018-05-11 DIAGNOSIS — K449 Diaphragmatic hernia without obstruction or gangrene: Secondary | ICD-10-CM | POA: Insufficient documentation

## 2018-05-11 DIAGNOSIS — F17210 Nicotine dependence, cigarettes, uncomplicated: Secondary | ICD-10-CM | POA: Diagnosis not present

## 2018-05-11 DIAGNOSIS — E048 Other specified nontoxic goiter: Secondary | ICD-10-CM | POA: Insufficient documentation

## 2018-05-11 NOTE — Consult Note (Signed)
NEW PATIENT EVALUATION  Name: Tiffany Mathews  MRN: [id number]  Date:   05/11/2018     DOB: [birth year]   This 57 y.o. 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  REFERRING PHYSICIAN: Cletis Athens, MD  CHIEF COMPLAINT:  Chief Complaint  Patient presents with  . Breast Cancer    Iniital eval    DIAGNOSIS: The encounter diagnosis was Malignant neoplasm of upper-outer quadrant of left breast in female, estrogen receptor positive ([place]).   PREVIOUS INVESTIGATIONS:  Mammograms ultrasound and MRI scans reviewed Pathology reports reviewed Clinical notes reviewed  HPI: Patient is a 57 year old female who presented with an abnormal mammogram of her left breast. There is actually a suspicious mass located at 2:00 position and several surrounding masses with a 1.2 cm lateral mass suspected satellite lesion. She will ultrasound-guided biopsy of both lesions and the lesion at the 2:00 position was invasive mammary carcinoma. The the second satellite lesion revealed PSEUDOANGIOMATOUS STROMAL HYPERPLASIA ([place]).  - NEGATIVE FOR ATYPIA AND MALIGNANCY.MRI of the breast show the 0.8 cm biopsy-proven malignancy in the upper outer quadrant of left breast. The second mass enhanced a lesser degree and was previously biopsied as benign. She underwent a wide local excision and sentinel node biopsy showing a 1 cm area of invasive mammary carcinoma overall grade 1 with margins clear at 7 mm. 2 sentinel lymph nodes were negative for metastatic disease.tumor was strongly ER/PR positive HER-2/neu not overexpressed. She also had Oncotype DX score showing a low risk of recurrence. She's been seen by medical oncology and will be planning on having antiestrogen therapy after completion of radiation. She is seen today for radiation oncology opinion. She is  doing well. She specifically denies breast tenderness cough or bone pain.   PLANNED TREATMENT REGIMEN: left whole breast radiation  PAST MEDICAL HISTORY:  has a past medical history of Anxiety, Arthritis, Bipolar 1 disorder ([place]), Breast cancer ([place]), Depression, Fibromyalgia, GERD (gastroesophageal reflux disease), History of hiatal hernia, Hyperlipidemia, Hypertension, Multiple thyroid nodules, and Sleep apnea.    PAST SURGICAL HISTORY:  Past Surgical History:  Procedure Laterality Date  . ABLATION  04/2011  . BREAST BIOPSY Right 2009   -  . BREAST LUMPECTOMY Left 04/19/2018  . CESAREAN SECTION     x3  . CHOLECYSTECTOMY    . GALLBLADDER SURGERY    . LYSIS OF ADHESION  05/27/2016   Procedure: LYSIS OF ADHESION;  Surgeon: Clayburn Pert, MD;  Location: [facility] ORS;  Service: General;;  . PARTIAL MASTECTOMY WITH NEEDLE LOCALIZATION Left 04/19/2018   Procedure: PARTIAL MASTECTOMY WITH NEEDLE LOCALIZATION;  Surgeon: Herbert Pun, MD;  Location: [facility] ORS;  Service: General;  Laterality: Left;  . ROBOTIC ASSISTED LAPAROSCOPIC CHOLECYSTECTOMY N/A 05/27/2016   Procedure: ROBOTIC ASSISTED LAPAROSCOPIC CHOLECYSTECTOMY;  Surgeon: Clayburn Pert, MD;  Location: [facility] ORS;  Service: General;  Laterality: N/A;  . SENTINEL NODE BIOPSY Left 04/19/2018   Procedure: SENTINEL NODE BIOPSY;  Surgeon: Herbert Pun, MD;  Location: [facility] OR[facility];  Service: General;  Laterality: Left;  . SHOULDER ARTHROSCOPY  2000  . TUBAL LIGATION      FAMILY HISTORY: family history includes Alcohol abuse in her brother; Alzheimer's disease in her father; Breast cancer (age of onset: 75) in her maternal aunt; Cancer in her other; Diabetes in her brother, brother, and mother; Drug abuse in her sister; Heart attack in  her maternal grandfather; Hypertension in her brother, brother, and mother; Schizophrenia in her brother; Stomach cancer in her paternal aunt and paternal uncle; Stroke in her father; Thyroid cancer in  her mother.  SOCIAL HISTORY:  reports that she has been smoking. She has a 20.00 pack-year smoking history. She has never used smokeless tobacco. She reports that she does not drink alcohol or use drugs.  ALLERGIES: Lamictal [lamotrigine]; Depakote [divalproex sodium]; and Lithium  MEDICATIONS:  Current Outpatient Medications  Medication Sig Dispense Refill  . aspirin EC 81 MG tablet Take 81 mg by mouth daily.     Marland Kitchen atorvastatin (LIPITOR) 40 MG tablet Take 40 mg by mouth daily.     . clonazePAM (KLONOPIN) 0.5 MG tablet Take 0.5 mg by mouth 2 (two) times daily.     . cyclobenzaprine (FLEXERIL) 10 MG tablet Take 10 mg by mouth at bedtime as needed for muscle spasms.     . DULoxetine (CYMBALTA) 20 MG capsule Take 20 mg by mouth daily.    Marland Kitchen gabapentin (NEURONTIN) 300 MG capsule Take 300 mg by mouth 3 (three) times daily.   1  . loratadine (CLARITIN) 10 MG tablet Take 10 mg by mouth daily as needed for allergies.  3  . losartan (COZAAR) 100 MG tablet Take 100 mg by mouth 2 (two) times daily.     . Multiple Vitamins-Minerals (MULTIVITAMIN WITH MINERALS) tablet Take 1 tablet by mouth daily.    . pantoprazole (PROTONIX) 40 MG tablet Take 1 tablet (40 mg total) by mouth daily. 90 tablet 3  . traZODone (DESYREL) 100 MG tablet Take 100 mg by mouth at bedtime as needed (sleep).   1  . triamcinolone cream (KENALOG) 0.1 % Apply 1 application topically 2 (two) times daily as needed (for foot rash).     . ziprasidone (GEODON) 40 MG capsule Take 40 mg by mouth every evening.      No current facility-administered medications for this encounter.     ECOG PERFORMANCE STATUS:  0 - Asymptomatic  REVIEW OF SYSTEMS:  Patient denies any weight loss, fatigue, weakness, fever, chills or night sweats. Patient denies any loss of vision, blurred vision. Patient denies any ringing  of the ears or hearing loss. No irregular heartbeat. Patient denies heart murmur or history of fainting. Patient denies any chest pain or  pain radiating to her upper extremities. Patient denies any shortness of breath, difficulty breathing at night, cough or hemoptysis. Patient denies any swelling in the lower legs. Patient denies any nausea vomiting, vomiting of blood, or coffee ground material in the vomitus. Patient denies any stomach pain. Patient states has had normal bowel movements no significant constipation or diarrhea. Patient denies any dysuria, hematuria or significant nocturia. Patient denies any problems walking, swelling in the joints or loss of balance. Patient denies any skin changes, loss of hair or loss of weight. Patient denies any excessive worrying or anxiety or significant depression. Patient denies any problems with insomnia. Patient denies excessive thirst, polyuria, polydipsia. Patient denies any swollen glands, patient denies easy bruising or easy bleeding. Patient denies any recent infections, allergies or URI. Patient "s visual fields have not changed significantly in recent time.    PHYSICAL EXAM: BP (P) 135/82 (BP Location: Right Arm, Patient Position: Sitting)   Pulse (P) 89   Temp (P) 98 F (36.7 C) (Tympanic)   Wt (P) 183 lb 8.5 oz (83.3 kg)   BMI (P) 30.54 kg/m  Left breast is wide local excision site which  is healing well she also has axillary site which is also healing well. No dominant mass or nodularity is noted in either breast in 2 positions examined. No axillary or supraclavicular adenopathy is identified.Well-developed well-nourished patient in NAD. HEENT reveals PERLA, EOMI, discs not visualized.  Oral cavity is clear. No oral mucosal lesions are identified. Neck is clear without evidence of cervical or supraclavicular adenopathy. Lungs are clear to A&P. Cardiac examination is essentially unremarkable with regular rate and rhythm without murmur rub or thrill. Abdomen is benign with no organomegaly or masses noted. Motor sensory and DTR levels are equal and symmetric in the upper and lower  extremities. Cranial nerves II through XII are grossly intact. Proprioception is intact. No peripheral adenopathy or edema is identified. No motor or sensory levels are noted. Crude visual fields are within normal range.  LABORATORY DATA: pathology reports reviewed    RADIOLOGY RESULTS:ultrasound mammogram and MRI scans reviewed   IMPRESSION: stage I invasive mammary carcinoma of the left breast as was wide local excision and sentinel node biopsy ER/PR positive HER-2/neu negative with a low risk of recurrence by Oncotype DX.  PLAN: at this time I to go ahead with whole breast radiation. Would plan on delivering 5040 cGy in 28 fractions boosting her scar another 1400 cGy using electrons. I believe her breasts we large and pendulous for hypofractionated course of treatment. Risks and benefits of treatment including skin reaction fatigue alteration of blood counts possible inclusion of superficial lung all were discussed in detail with the patient. She seems to comprehend my treatment plan well. Patient will be candidate for antiestrogen therapy after completion of radiation. I have personally set up and ordered CT simulation after the first of the year as per patient's request.  I would like to take this opportunity to thank you for allowing me to participate in the care of your patient.Noreene Filbert, MD

## 2018-05-11 NOTE — Telephone Encounter (Signed)
Called patient to let her know that I did get in touch with Vita Barley the nurse navigator.  When she comes for her first simulation which is January 7 if she would stop by the desk recheck in it and asked for [facility].  She will give you a gas voucher.  Vita Barley states that she will give you about $20-$25 each week that you are in radiation.  Also told her that her bone density has been scheduled for March as well as a follow-up with Dr. Janese Banks in March and I will mail it to her since it is so far ahead in the future.  I told patient she could call me if she needs in any questions answered or has any concerns at [phone].

## 2018-05-11 NOTE — Addendum Note (Signed)
Addended by: Waymon Budge on: 05/11/2018 02:55 PM   Modules accepted: Orders

## 2018-05-11 NOTE — Progress Notes (Signed)
Here for follow up. Stated overall she is doing well. Stated she is seeing an ENT next week for diff swallowing she stated. Stated she is having left breast t discomfort ( not pain ) ,and "under arm "  Able to have full ROM she stated.

## 2018-05-11 NOTE — Progress Notes (Signed)
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 Approximately 1.2 cm lateral to this mass were 2-3 tightly clustered hypoechoic nodules together measuring 29 x 0.2 x 0.8 cm. There was also an other hypoechoic area which was suggestive of fibrocystic tissue. Several other tiny hypoechoic masses were noted with prominent ducts which could represent fibrocystic change. DCIS is also a concern. Ultrasound of the left axilla did not reveal any pathologic adenopathy. Patient underwent core biopsy of the dominant mass as well as the cluster of nodules immediately adjacent to it. 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 Biopsy of the second breast mass was negative for atypia and malignancy.  MRI of bilateral breasts on 04/11/2018 showed 0.8 cm biopsy-proven malignancy in the upper outer quadrant of the left breast.  Second mass in the upper outer quadrant of the left breast which is known and pathology to be benign and enhances to a lesser degree than does the biopsy malignancy.  Patient underwent lumpectomy and sentinel lymph node biopsy on 04/19/2018.  Final pathology showed invasive mammary carcinoma, grade 1, 10 mm. 2  sentinel lymph nodes were negative for malignancy.  Negative margins.  ER PR greater than 90% positive.  HER-2/neu negative.  pT1b pN0  Patient did undergo genetic testing which did not reveal any mutation.   Interval history-patient reports some difficulty swallowing over the last 1 week and feels like she has some swelling in the inner aspect of her throat for which she is seeing ENT next week.  Other than that she is doing well and reports no other significant side effects  ECOG PS- 0 Pain scale- 0   Review of systems- Review of Systems  Constitutional: Negative for chills, fever, malaise/fatigue and weight loss.  HENT: Negative for congestion, ear discharge and nosebleeds.        Difficulty swallowing  Eyes: Negative for blurred vision.  Respiratory: Negative for cough, hemoptysis, sputum production, shortness of breath and wheezing.   Cardiovascular: Negative for chest pain, palpitations, orthopnea and claudication.  Gastrointestinal: Negative for abdominal pain, blood in stool, constipation, diarrhea, heartburn, melena, nausea and vomiting.  Genitourinary: Negative for dysuria, flank pain, frequency, hematuria and urgency.  Musculoskeletal: Negative for back pain, joint pain and myalgias.  Skin: Negative for rash.  Neurological: Negative for dizziness, tingling, focal weakness, seizures, weakness and headaches.  Endo/Heme/Allergies: Does not bruise/bleed easily.  Psychiatric/Behavioral: Negative for depression and suicidal ideas. The patient does not have insomnia.       Allergies  Allergen Reactions  . Lamictal [Lamotrigine] Rash and Other (See Comments)  . Depakote [Divalproex Sodium] Other (See Comments)    Severe hair loss  . Lithium Other (See Comments)    Unknown; "It just made me feel bad."     Past Medical History:  Diagnosis  Date  . Anxiety   . Arthritis   . Bipolar 1 disorder ([place])   . Breast cancer ([place])   . Depression   . Fibromyalgia   . GERD  (gastroesophageal reflux disease)   . History of hiatal hernia   . Hyperlipidemia   . Hypertension   . Multiple thyroid nodules    benign  . Sleep apnea      Past Surgical History:  Procedure Laterality Date  . ABLATION  04/2011  . BREAST BIOPSY Right 2009   -  . BREAST LUMPECTOMY Left 04/19/2018  . CESAREAN SECTION     x3  . CHOLECYSTECTOMY    . GALLBLADDER SURGERY    . LYSIS OF ADHESION  05/27/2016   Procedure: LYSIS OF ADHESION;  Surgeon: Clayburn Pert, MD;  Location: [facility] ORS;  Service: General;;  . PARTIAL MASTECTOMY WITH NEEDLE LOCALIZATION Left 04/19/2018   Procedure: PARTIAL MASTECTOMY WITH NEEDLE LOCALIZATION;  Surgeon: Herbert Pun, MD;  Location: [facility] ORS;  Service: General;  Laterality: Left;  . ROBOTIC ASSISTED LAPAROSCOPIC CHOLECYSTECTOMY N/A 05/27/2016   Procedure: ROBOTIC ASSISTED LAPAROSCOPIC CHOLECYSTECTOMY;  Surgeon: Clayburn Pert, MD;  Location: [facility] ORS;  Service: General;  Laterality: N/A;  . SENTINEL NODE BIOPSY Left 04/19/2018   Procedure: SENTINEL NODE BIOPSY;  Surgeon: Herbert Pun, MD;  Location: [facility] ORS;  Service: General;  Laterality: Left;  . SHOULDER ARTHROSCOPY  2000  . TUBAL LIGATION      Social History   Socioeconomic History  . Marital status: Divorced    Spouse name: Not on file  . Number of children: Not on file  . Years of education: Not on file  . Highest education level: Not on file  Occupational History  . Not on file  Social Needs  . Financial resource strain: Not on file  . Food insecurity:    Worry: Not on file    Inability: Not on file  . Transportation needs:    Medical: Not on file    Non-medical: Not on file  Tobacco Use  . Smoking status: Current Every Day Smoker    Packs/day: 1.00    Years: 20.00    Pack years: 20.00  . Smokeless tobacco: Never Used  Substance and Sexual Activity  . Alcohol use: No  . Drug use: No  . Sexual activity: Not Currently  Lifestyle  . Physical activity:     Days per week: Not on file    Minutes per session: Not on file  . Stress: Not on file  Relationships  . Social connections:    Talks on phone: Not on file    Gets together: Not on file    Attends religious service: Not on file    Active member of club or organization: Not on file    Attends meetings of clubs or organizations: Not on file    Relationship status: Not on file  . Intimate partner violence:    Fear of current or ex partner: Not on file    Emotionally abused: Not on file    Physically abused: Not on file    Forced sexual activity: Not on file  Other Topics Concern  . Not on file  Social History Narrative  . Not on file    Family History  Problem Relation Age of Onset  . Thyroid cancer Mother        dx 94s; currently 36  . Hypertension Mother   . Diabetes Mother   . Breast cancer Maternal Aunt 22  deceased 41s  . Alzheimer's disease Father        deceased 28  . Stroke Father   . Drug abuse Sister        deceased 45  . Diabetes Brother   . Hypertension Brother   . Heart attack Maternal Grandfather   . Diabetes Brother   . Hypertension Brother   . Alcohol abuse Brother   . Schizophrenia Brother   . Stomach cancer Paternal Aunt        age at dx unknown  . Stomach cancer Paternal Uncle        age at dx unknown  . Cancer Other        daughter of sister who died at 17; breast ca in 43s and ovarian cancer at 31; currently 65s     Current Outpatient Medications:  .  aspirin EC 81 MG tablet, Take 81 mg by mouth daily. , Disp: , Rfl:  .  atorvastatin (LIPITOR) 40 MG tablet, Take 40 mg by mouth daily. , Disp: , Rfl:  .  clonazePAM (KLONOPIN) 0.5 MG tablet, Take 0.5 mg by mouth 2 (two) times daily. , Disp: , Rfl:  .  cyclobenzaprine (FLEXERIL) 10 MG tablet, Take 10 mg by mouth at bedtime as needed for muscle spasms. , Disp: , Rfl:  .  DULoxetine (CYMBALTA) 20 MG capsule, Take 20 mg by mouth daily., Disp: , Rfl:  .  gabapentin (NEURONTIN) 300 MG capsule,  Take 300 mg by mouth 3 (three) times daily. , Disp: , Rfl: 1 .  loratadine (CLARITIN) 10 MG tablet, Take 10 mg by mouth daily as needed for allergies., Disp: , Rfl: 3 .  losartan (COZAAR) 100 MG tablet, Take 100 mg by mouth 2 (two) times daily. , Disp: , Rfl:  .  Multiple Vitamins-Minerals (MULTIVITAMIN WITH MINERALS) tablet, Take 1 tablet by mouth daily., Disp: , Rfl:  .  pantoprazole (PROTONIX) 40 MG tablet, Take 1 tablet (40 mg total) by mouth daily., Disp: 90 tablet, Rfl: 3 .  traZODone (DESYREL) 100 MG tablet, Take 100 mg by mouth at bedtime as needed (sleep). , Disp: , Rfl: 1 .  triamcinolone cream (KENALOG) 0.1 %, Apply 1 application topically 2 (two) times daily as needed (for foot rash). , Disp: , Rfl:  .  ziprasidone (GEODON) 40 MG capsule, Take 40 mg by mouth every evening. , Disp: , Rfl:   Physical exam:  Vitals:   05/11/18 1128  BP: (!) 141/92  Pulse: 86  Resp: 18  Temp: (!) 95.7 F (35.4 C)  TempSrc: Tympanic  Weight: 182 lb 11.2 oz (82.9 kg)   Physical Exam  Constitutional: She is oriented to person, place, and time. She appears well-developed and well-nourished.  HENT:  Head: Normocephalic and atraumatic.  Mouth/Throat: Oropharynx is clear and moist.  Tongue appears mildly swollen. No palpable cervical adenopathy  Eyes: Pupils are equal, round, and reactive to light. EOM are normal.  Neck: Normal range of motion.  Cardiovascular: Normal rate, regular rhythm and normal heart sounds.  Pulmonary/Chest: Effort normal and breath sounds normal.  Abdominal: Soft. Bowel sounds are normal.  Neurological: She is alert and oriented to person, place, and time.  Skin: Skin is warm and dry.   Breast exam was performed in seated and lying down position. Patient is status post left lumpectomy with a well-healed surgical scar. No evidence of any palpable masses. No evidence of axillary adenopathy. No evidence of any palpable masses or lumps in the right breast. No  evidence of right  axillary adenopathy   CMP Latest Ref Rng & Units 07/10/2015  Glucose 65 - 99 mg/dL 123(H)  BUN 6 - 20 mg/dL 14  Creatinine 0.44 - 1.00 mg/dL 0.83  Sodium 135 - 145 mmol/L 141  Potassium 3.5 - 5.1 mmol/L 3.7  Chloride 101 - 111 mmol/L 110  CO2 22 - 32 mmol/L 26  Calcium 8.9 - 10.3 mg/dL 8.8(L)   CBC Latest Ref Rng & Units 07/10/2015  WBC 3.6 - 11.0 K/uL 8.5  Hemoglobin 12.0 - 16.0 g/dL 14.0  Hematocrit 35.0 - 47.0 % 41.5  Platelets 150 - 440 K/uL 351    No images are attached to the encounter.  Mr Breast Bilateral W Wo Contrast Inc Cad  Result Date: 04/12/2018 CLINICAL DATA:  Recent diagnosis of invasive mammary carcinoma following ultrasound-guided biopsy of a left breast mass at 2 o'clock position 3 cm from the nipple. A second ultrasound-guided biopsy of the left breast performed the same day, of a mass at 2 o'clock position 4 cm from the nipple showed benign pathology, including pseudoangiomatous stromal hyperplasia. LABS:  Creatinine was obtained on site at [facility] at [address] Results: Creatinine 0.8 mg/dL. EXAM: BILATERAL BREAST MRI WITH AND WITHOUT CONTRAST TECHNIQUE: Multiplanar, multisequence MR images of both breasts were obtained prior to and following the intravenous administration of 8 ml of Gadavist. Three-dimensional MR images were rendered by post-processing of the original MR data on an independent workstation. The three-dimensional MR images were interpreted, and findings are reported in the following complete MRI report for this study. Three dimensional images were evaluated at the independent DynaCad workstation COMPARISON:  Previous exam(s) at the [facility] in [place]. FINDINGS: Breast composition: b. Scattered fibroglandular tissue. Background parenchymal enhancement: Mild Right breast: No mass or abnormal enhancement. Left breast: In the middle third of the upper-outer left breast is an irregular 0.8 cm enhancing mass with  persistent kinetics and associated biopsy clip artifact. This corresponds to the biopsy-proven malignancy in the 2 o'clock position of the left breast 3 cm from the nipple. Slightly inferiorly in the same 2 o'clock position of the left breast is a second mass that enhances to a lesser degree and contains central biopsy clip artifact. This mass measures approximately 0.9 cm, and is felt to correspond to the biopsy-proven pseudoangiomatous stromal hyperplasia. Minimal central enhancement with persistent type kinetics is seen at this biopsy site. Lymph nodes: No abnormal appearing lymph nodes. Ancillary findings:  None. IMPRESSION: 1. 0.8 cm biopsy-proven malignancy in the upper-outer left breast with central biopsy clip artifact. 2. A second mass in the upper outer left breast with central biopsy clip artifact was shown at pathology to be benign, and enhances to a lesser degree than does the biopsied malignancy. 3. No additional suspicious areas of enhancement in the left breast to suggest multifocal disease. 4. No MRI evidence of malignancy in the right breast. RECOMMENDATION: Continued surgical planning for known solitary left breast cancer upper outer quadrant left breast. BI-RADS CATEGORY  6: Known biopsy-proven malignancy. Electronically Signed   By: Curlene Dolphin M.D.   On: 04/12/2018 11:58   Nm Sentinel Node Injection  Addendum Date: 04/19/2018   ADDENDUM REPORT: 04/19/2018 08:50 ADDENDUM: RADIOPHARMACEUTICALS:   Total of 6.808 millicurie Millipore-filtered Technetium-31msulfur colloid, injected in four aliquots of 0.25 mCi Electronically Signed   By: AMarybelle KillingsM.D.   On: 04/19/2018 08:50   Result Date: 04/19/2018 CLINICAL DATA:  Left breast cancer. EXAM: NUCLEAR  MEDICINE BREAST LYMPHOSCINTIGRAPHY TECHNIQUE: Intradermal injection of radiopharmaceutical was performed at the 12 o'clock, 3 o'clock, 6 o'clock, and 9 o'clock positions around the left nipple. The patient was then sent to the operating  room where the sentinel node(s) were identified and removed by the surgeon. RADIOPHARMACEUTICALS:  Total of 1 mCi Millipore-filtered Technetium-34msulfur colloid, injected in four aliquots of 0.25 mCi each. IMPRESSION: Uncomplicated intradermal injection of a total of 1 mCi Technetium-961mulfur colloid for purposes of sentinel node identification. Electronically Signed: By: ArMarybelle Killings.D. On: 04/19/2018 08:21   Mm Breast Surgical Specimen  Result Date: 04/19/2018 CLINICAL DATA:  Biopsy proven left breast invasive mammary carcinoma. EXAM: SPECIMEN RADIOGRAPH OF THE LEFT BREAST COMPARISON:  Previous exam(s). FINDINGS: Status post excision of the left breast. The wire tip and biopsy marker clip are present and are marked for pathology. IMPRESSION: Specimen radiograph of the left breast. Electronically Signed   By: DiLillia Mountain.D.   On: 04/19/2018 10:36   Mm Lt Plc Breast Loc Dev   1st Lesion  Inc Mammo Guide  Result Date: 04/19/2018 CLINICAL DATA:  Biopsy proven invasive mammary carcinoma in the left breast. EXAM: NEEDLE LOCALIZATION OF THE LEFT BREAST WITH MAMMO GUIDANCE COMPARISON:  Previous exams. FINDINGS: Patient presents for needle localization prior to surgery. I met with the patient and we discussed the procedure of needle localization including benefits and alternatives. We discussed the high likelihood of a successful procedure. We discussed the risks of the procedure, including infection, bleeding, tissue injury, and further surgery. Informed, written consent was given. The usual time-out protocol was performed immediately prior to the procedure. Using mammographic guidance, sterile technique, 1% lidocaine and a #5 modified Kopans needle, the ribbon shaped clip was localized using lateral to medial approach. The images were marked for Dr. CiZachery DauerIMPRESSION: Needle localization the left breast. No apparent complications. Electronically Signed   By: DiLillia Mountain.D.   On: 04/19/2018 09:05      Assessment and plan- Patient is a [age].o. female with  invasive mammary carcinoma of the left breast pathological prognostic stage Ia pT1b pN0 cM0 ER PR positive HER-2/neu negative status post lumpectomy.  She is here to discuss Oncotype results and further management  Oncotype score came back at intermediate risk with a score of 12.  Given that she is greater than [age]ears of age, per TASheridanrial she does not benefit from adjuvant chemotherapy.  Patient has already seen radiation oncology today and will be starting adjuvant radiation next month.  Given that her tumor was ER PR positive hormone therapy is indicated at this time.  Idiscussed the role for hormone therapy. Given that she is postmenopausal I would favor 5 years of adjuvant hormone therapy with aromatase inhibitor. I discussed the risks and benefits of Arimidex including all but not limited to fatigue, hypercholesterolemia, hot flashes, arthralgias and worsening bone health.  Patient will also need to be on calcium 1200 mg along with vitamin D 800 international units.  We will obtain a baseline bone density scan written information about Arimidex given to the patient. I would like her to finish radiation therapy and start hormone therapy thereafter. Patient verbalized understanding and agrees to proceed.  Treatment will be given with a curative intent  Patient will call usKoreahen she is nearing the end of her radiation treatment which would be sometime in mid February and we will prescribe her Arimidex to her at that time.  I will see her end of  March with a CMP after she has been on Arimidex for about 6 weeks  Cancer Staging Malignant neoplasm of left breast in female, estrogen receptor positive ([place]) Staging form: Breast, AJCC 8th Edition - Clinical stage from 04/04/2018: Stage IA (cT1b, cN0, cM0, G1, ER+, PR+, HER2-) - Signed by Sindy Guadeloupe, MD on 04/07/2018 - Pathologic stage from 04/25/2018: Stage IA (pT1b, pN0, cM0, G1, ER+,  PR+, HER2-) - Signed by Sindy Guadeloupe, MD on 04/25/2018      Visit Diagnosis 1. Malignant neoplasm of upper-outer quadrant of left breast in female, estrogen receptor positive ([place])   2. Goals of care, counseling/discussion      Dr. Randa Evens, MD, MPH [facility] at [facility] [phone] 05/11/2018 12:15 PM

## 2018-05-17 ENCOUNTER — Telehealth: Payer: Self-pay | Admitting: Gastroenterology

## 2018-05-17 DIAGNOSIS — R1084 Generalized abdominal pain: Secondary | ICD-10-CM

## 2018-05-17 NOTE — Telephone Encounter (Signed)
Pt.notified

## 2018-05-17 NOTE — Telephone Encounter (Signed)
Pt would like to know if it's time for her to have a colon/endo test? Would also like to know the exact date of her last procedure?

## 2018-05-17 NOTE — Telephone Encounter (Signed)
Pt notified of when her last colonoscopy/EGD was done. Pt requested a copy be mailed to her and faxed to her PCP.

## 2018-05-25 LAB — HM PAP SMEAR: HM Pap smear: NORMAL

## 2018-06-13 ENCOUNTER — Ambulatory Visit
Admission: RE | Admit: 2018-06-13 | Discharge: 2018-06-13 | Disposition: A | Discharge status: Home / Self Care | Payer: Medicare Other | Source: Ambulatory Visit | Attending: Radiation Oncology | Admitting: Radiation Oncology

## 2018-06-13 DIAGNOSIS — Z170 Estrogen receptor positive status [ER+]: Secondary | ICD-10-CM | POA: Insufficient documentation

## 2018-06-13 DIAGNOSIS — Z510 Encounter for antineoplastic radiation therapy: Secondary | ICD-10-CM | POA: Insufficient documentation

## 2018-06-13 DIAGNOSIS — C50412 Malignant neoplasm of upper-outer quadrant of left female breast: Secondary | ICD-10-CM | POA: Insufficient documentation

## 2018-06-14 DIAGNOSIS — Z510 Encounter for antineoplastic radiation therapy: Secondary | ICD-10-CM | POA: Diagnosis not present

## 2018-06-16 ENCOUNTER — Other Ambulatory Visit: Payer: Self-pay | Admitting: *Deleted

## 2018-06-16 DIAGNOSIS — Z170 Estrogen receptor positive status [ER+]: Principal | ICD-10-CM

## 2018-06-16 DIAGNOSIS — C50412 Malignant neoplasm of upper-outer quadrant of left female breast: Secondary | ICD-10-CM

## 2018-06-20 ENCOUNTER — Ambulatory Visit
Admission: RE | Admit: 2018-06-20 | Discharge: 2018-06-20 | Disposition: A | Discharge status: Home / Self Care | Payer: Medicare Other | Source: Ambulatory Visit | Attending: Radiation Oncology | Admitting: Radiation Oncology

## 2018-06-20 DIAGNOSIS — Z510 Encounter for antineoplastic radiation therapy: Secondary | ICD-10-CM | POA: Diagnosis not present

## 2018-06-21 ENCOUNTER — Ambulatory Visit
Admission: RE | Admit: 2018-06-21 | Discharge: 2018-06-21 | Disposition: A | Discharge status: Home / Self Care | Payer: Medicare Other | Source: Ambulatory Visit | Attending: Radiation Oncology | Admitting: Radiation Oncology

## 2018-06-21 DIAGNOSIS — Z510 Encounter for antineoplastic radiation therapy: Secondary | ICD-10-CM | POA: Diagnosis not present

## 2018-06-22 ENCOUNTER — Ambulatory Visit
Admission: RE | Admit: 2018-06-22 | Discharge: 2018-06-22 | Disposition: A | Discharge status: Home / Self Care | Payer: Medicare Other | Source: Ambulatory Visit | Attending: Radiation Oncology | Admitting: Radiation Oncology

## 2018-06-22 DIAGNOSIS — Z510 Encounter for antineoplastic radiation therapy: Secondary | ICD-10-CM | POA: Diagnosis not present

## 2018-06-23 ENCOUNTER — Ambulatory Visit
Admission: RE | Admit: 2018-06-23 | Discharge: 2018-06-23 | Disposition: A | Discharge status: Home / Self Care | Payer: Medicare Other | Source: Ambulatory Visit | Attending: Radiation Oncology | Admitting: Radiation Oncology

## 2018-06-23 DIAGNOSIS — Z510 Encounter for antineoplastic radiation therapy: Secondary | ICD-10-CM | POA: Diagnosis not present

## 2018-06-26 ENCOUNTER — Ambulatory Visit
Admission: RE | Admit: 2018-06-26 | Discharge: 2018-06-26 | Disposition: A | Discharge status: Home / Self Care | Payer: Medicare Other | Source: Ambulatory Visit | Attending: Radiation Oncology | Admitting: Radiation Oncology

## 2018-06-26 DIAGNOSIS — Z510 Encounter for antineoplastic radiation therapy: Secondary | ICD-10-CM | POA: Diagnosis not present

## 2018-06-27 ENCOUNTER — Ambulatory Visit
Admission: RE | Admit: 2018-06-27 | Discharge: 2018-06-27 | Disposition: A | Discharge status: Home / Self Care | Payer: Medicare Other | Source: Ambulatory Visit | Attending: Radiation Oncology | Admitting: Radiation Oncology

## 2018-06-27 DIAGNOSIS — Z510 Encounter for antineoplastic radiation therapy: Secondary | ICD-10-CM | POA: Diagnosis not present

## 2018-06-28 ENCOUNTER — Ambulatory Visit
Admission: RE | Admit: 2018-06-28 | Discharge: 2018-06-28 | Disposition: A | Discharge status: Home / Self Care | Payer: Medicare Other | Source: Ambulatory Visit | Attending: Radiation Oncology | Admitting: Radiation Oncology

## 2018-06-28 DIAGNOSIS — Z510 Encounter for antineoplastic radiation therapy: Secondary | ICD-10-CM | POA: Diagnosis not present

## 2018-06-29 ENCOUNTER — Ambulatory Visit
Admission: RE | Admit: 2018-06-29 | Discharge: 2018-06-29 | Disposition: A | Discharge status: Home / Self Care | Payer: Medicare Other | Source: Ambulatory Visit | Attending: Radiation Oncology | Admitting: Radiation Oncology

## 2018-06-29 DIAGNOSIS — Z510 Encounter for antineoplastic radiation therapy: Secondary | ICD-10-CM | POA: Diagnosis not present

## 2018-06-30 ENCOUNTER — Ambulatory Visit
Admission: RE | Admit: 2018-06-30 | Discharge: 2018-06-30 | Disposition: A | Discharge status: Home / Self Care | Payer: Medicare Other | Source: Ambulatory Visit | Attending: Radiation Oncology | Admitting: Radiation Oncology

## 2018-06-30 DIAGNOSIS — Z510 Encounter for antineoplastic radiation therapy: Secondary | ICD-10-CM | POA: Diagnosis not present

## 2018-07-03 ENCOUNTER — Ambulatory Visit
Admission: RE | Admit: 2018-07-03 | Discharge: 2018-07-03 | Disposition: A | Discharge status: Home / Self Care | Payer: Medicare Other | Source: Ambulatory Visit | Attending: Radiation Oncology | Admitting: Radiation Oncology

## 2018-07-03 DIAGNOSIS — Z510 Encounter for antineoplastic radiation therapy: Secondary | ICD-10-CM | POA: Diagnosis not present

## 2018-07-04 ENCOUNTER — Ambulatory Visit
Admission: RE | Admit: 2018-07-04 | Discharge: 2018-07-04 | Disposition: A | Discharge status: Home / Self Care | Payer: Medicare Other | Source: Ambulatory Visit | Attending: Radiation Oncology | Admitting: Radiation Oncology

## 2018-07-04 DIAGNOSIS — Z510 Encounter for antineoplastic radiation therapy: Secondary | ICD-10-CM | POA: Diagnosis not present

## 2018-07-05 ENCOUNTER — Ambulatory Visit
Admission: RE | Admit: 2018-07-05 | Discharge: 2018-07-05 | Disposition: A | Discharge status: Home / Self Care | Payer: Medicare Other | Source: Ambulatory Visit | Attending: Radiation Oncology | Admitting: Radiation Oncology

## 2018-07-05 DIAGNOSIS — Z510 Encounter for antineoplastic radiation therapy: Secondary | ICD-10-CM | POA: Diagnosis not present

## 2018-07-06 ENCOUNTER — Inpatient Hospital Stay: Payer: Medicare Other | Attending: Radiation Oncology

## 2018-07-06 ENCOUNTER — Ambulatory Visit
Admission: RE | Admit: 2018-07-06 | Discharge: 2018-07-06 | Disposition: A | Discharge status: Home / Self Care | Payer: Medicare Other | Source: Ambulatory Visit | Attending: Radiation Oncology | Admitting: Radiation Oncology

## 2018-07-06 DIAGNOSIS — Z170 Estrogen receptor positive status [ER+]: Secondary | ICD-10-CM | POA: Insufficient documentation

## 2018-07-06 DIAGNOSIS — Z510 Encounter for antineoplastic radiation therapy: Secondary | ICD-10-CM | POA: Diagnosis not present

## 2018-07-06 DIAGNOSIS — C50412 Malignant neoplasm of upper-outer quadrant of left female breast: Secondary | ICD-10-CM | POA: Diagnosis not present

## 2018-07-06 LAB — CBC
HCT: 39.3 % (ref 36.0–46.0)
Hemoglobin: 13.2 g/dL (ref 12.0–15.0)
MCH: 31.1 pg (ref 26.0–34.0)
MCHC: 33.6 g/dL (ref 30.0–36.0)
MCV: 92.7 fL (ref 80.0–100.0)
Platelets: 366 10*3/uL (ref 150–400)
RBC: 4.24 MIL/uL (ref 3.87–5.11)
RDW: 13 % (ref 11.5–15.5)
WBC: 8.1 10*3/uL (ref 4.0–10.5)
nRBC: 0 % (ref 0.0–0.2)

## 2018-07-07 ENCOUNTER — Ambulatory Visit
Admission: RE | Admit: 2018-07-07 | Discharge: 2018-07-07 | Disposition: A | Discharge status: Home / Self Care | Payer: Medicare Other | Source: Ambulatory Visit | Attending: Radiation Oncology | Admitting: Radiation Oncology

## 2018-07-07 DIAGNOSIS — Z510 Encounter for antineoplastic radiation therapy: Secondary | ICD-10-CM | POA: Diagnosis not present

## 2018-07-10 ENCOUNTER — Ambulatory Visit
Admission: RE | Admit: 2018-07-10 | Discharge: 2018-07-10 | Disposition: A | Discharge status: Home / Self Care | Payer: Medicare Other | Source: Ambulatory Visit | Attending: Radiation Oncology | Admitting: Radiation Oncology

## 2018-07-10 DIAGNOSIS — Z170 Estrogen receptor positive status [ER+]: Secondary | ICD-10-CM | POA: Insufficient documentation

## 2018-07-10 DIAGNOSIS — Z510 Encounter for antineoplastic radiation therapy: Secondary | ICD-10-CM | POA: Diagnosis not present

## 2018-07-10 DIAGNOSIS — C50412 Malignant neoplasm of upper-outer quadrant of left female breast: Secondary | ICD-10-CM | POA: Insufficient documentation

## 2018-07-11 ENCOUNTER — Ambulatory Visit
Admission: RE | Admit: 2018-07-11 | Discharge: 2018-07-11 | Disposition: A | Discharge status: Home / Self Care | Payer: Medicare Other | Source: Ambulatory Visit | Attending: Radiation Oncology | Admitting: Radiation Oncology

## 2018-07-11 DIAGNOSIS — Z510 Encounter for antineoplastic radiation therapy: Secondary | ICD-10-CM | POA: Diagnosis not present

## 2018-07-12 ENCOUNTER — Ambulatory Visit
Admission: RE | Admit: 2018-07-12 | Discharge: 2018-07-12 | Disposition: A | Discharge status: Home / Self Care | Payer: Medicare Other | Source: Ambulatory Visit | Attending: Radiation Oncology | Admitting: Radiation Oncology

## 2018-07-12 DIAGNOSIS — Z510 Encounter for antineoplastic radiation therapy: Secondary | ICD-10-CM | POA: Diagnosis not present

## 2018-07-13 ENCOUNTER — Ambulatory Visit: Payer: Medicare Other

## 2018-07-14 ENCOUNTER — Ambulatory Visit
Admission: RE | Admit: 2018-07-14 | Discharge: 2018-07-14 | Disposition: A | Discharge status: Home / Self Care | Payer: Medicare Other | Source: Ambulatory Visit | Attending: Radiation Oncology | Admitting: Radiation Oncology

## 2018-07-14 DIAGNOSIS — Z510 Encounter for antineoplastic radiation therapy: Secondary | ICD-10-CM | POA: Diagnosis not present

## 2018-07-17 ENCOUNTER — Ambulatory Visit
Admission: RE | Admit: 2018-07-17 | Discharge: 2018-07-17 | Disposition: A | Discharge status: Home / Self Care | Payer: Medicare Other | Source: Ambulatory Visit | Attending: Radiation Oncology | Admitting: Radiation Oncology

## 2018-07-17 DIAGNOSIS — Z510 Encounter for antineoplastic radiation therapy: Secondary | ICD-10-CM | POA: Diagnosis not present

## 2018-07-18 ENCOUNTER — Ambulatory Visit
Admission: RE | Admit: 2018-07-18 | Discharge: 2018-07-18 | Disposition: A | Discharge status: Home / Self Care | Payer: Medicare Other | Source: Ambulatory Visit | Attending: Radiation Oncology | Admitting: Radiation Oncology

## 2018-07-18 DIAGNOSIS — Z510 Encounter for antineoplastic radiation therapy: Secondary | ICD-10-CM | POA: Diagnosis not present

## 2018-07-19 ENCOUNTER — Ambulatory Visit
Admission: RE | Admit: 2018-07-19 | Discharge: 2018-07-19 | Disposition: A | Discharge status: Home / Self Care | Payer: Medicare Other | Source: Ambulatory Visit | Attending: Radiation Oncology | Admitting: Radiation Oncology

## 2018-07-19 DIAGNOSIS — Z510 Encounter for antineoplastic radiation therapy: Secondary | ICD-10-CM | POA: Diagnosis not present

## 2018-07-20 ENCOUNTER — Ambulatory Visit
Admission: RE | Admit: 2018-07-20 | Discharge: 2018-07-20 | Disposition: A | Discharge status: Home / Self Care | Payer: Medicare Other | Source: Ambulatory Visit | Attending: Radiation Oncology | Admitting: Radiation Oncology

## 2018-07-20 ENCOUNTER — Inpatient Hospital Stay: Payer: Medicare Other | Attending: Radiation Oncology

## 2018-07-20 DIAGNOSIS — M129 Arthropathy, unspecified: Secondary | ICD-10-CM | POA: Diagnosis not present

## 2018-07-20 DIAGNOSIS — Z79899 Other long term (current) drug therapy: Secondary | ICD-10-CM | POA: Insufficient documentation

## 2018-07-20 DIAGNOSIS — Z170 Estrogen receptor positive status [ER+]: Secondary | ICD-10-CM | POA: Insufficient documentation

## 2018-07-20 DIAGNOSIS — Z79811 Long term (current) use of aromatase inhibitors: Secondary | ICD-10-CM | POA: Diagnosis not present

## 2018-07-20 DIAGNOSIS — I10 Essential (primary) hypertension: Secondary | ICD-10-CM | POA: Insufficient documentation

## 2018-07-20 DIAGNOSIS — F319 Bipolar disorder, unspecified: Secondary | ICD-10-CM | POA: Insufficient documentation

## 2018-07-20 DIAGNOSIS — C50412 Malignant neoplasm of upper-outer quadrant of left female breast: Secondary | ICD-10-CM | POA: Insufficient documentation

## 2018-07-20 DIAGNOSIS — Z510 Encounter for antineoplastic radiation therapy: Secondary | ICD-10-CM | POA: Diagnosis not present

## 2018-07-20 DIAGNOSIS — F329 Major depressive disorder, single episode, unspecified: Secondary | ICD-10-CM | POA: Insufficient documentation

## 2018-07-20 DIAGNOSIS — Z7982 Long term (current) use of aspirin: Secondary | ICD-10-CM | POA: Diagnosis not present

## 2018-07-20 LAB — CBC
HCT: 40.9 % (ref 36.0–46.0)
Hemoglobin: 14.1 g/dL (ref 12.0–15.0)
MCH: 31.9 pg (ref 26.0–34.0)
MCHC: 34.5 g/dL (ref 30.0–36.0)
MCV: 92.5 fL (ref 80.0–100.0)
Platelets: 351 10*3/uL (ref 150–400)
RBC: 4.42 MIL/uL (ref 3.87–5.11)
RDW: 12.6 % (ref 11.5–15.5)
WBC: 7.6 10*3/uL (ref 4.0–10.5)
nRBC: 0 % (ref 0.0–0.2)

## 2018-07-21 ENCOUNTER — Ambulatory Visit
Admission: RE | Admit: 2018-07-21 | Discharge: 2018-07-21 | Disposition: A | Discharge status: Home / Self Care | Payer: Medicare Other | Source: Ambulatory Visit | Attending: Radiation Oncology | Admitting: Radiation Oncology

## 2018-07-21 DIAGNOSIS — Z510 Encounter for antineoplastic radiation therapy: Secondary | ICD-10-CM | POA: Diagnosis not present

## 2018-07-24 ENCOUNTER — Ambulatory Visit
Admission: RE | Admit: 2018-07-24 | Discharge: 2018-07-24 | Disposition: A | Discharge status: Home / Self Care | Payer: Medicare Other | Source: Ambulatory Visit | Attending: Radiation Oncology | Admitting: Radiation Oncology

## 2018-07-24 DIAGNOSIS — Z510 Encounter for antineoplastic radiation therapy: Secondary | ICD-10-CM | POA: Diagnosis not present

## 2018-07-25 ENCOUNTER — Ambulatory Visit
Admission: RE | Admit: 2018-07-25 | Discharge: 2018-07-25 | Disposition: A | Discharge status: Home / Self Care | Payer: Medicare Other | Source: Ambulatory Visit | Attending: Radiation Oncology | Admitting: Radiation Oncology

## 2018-07-25 DIAGNOSIS — Z510 Encounter for antineoplastic radiation therapy: Secondary | ICD-10-CM | POA: Diagnosis not present

## 2018-07-26 ENCOUNTER — Ambulatory Visit
Admission: RE | Admit: 2018-07-26 | Discharge: 2018-07-26 | Disposition: A | Discharge status: Home / Self Care | Payer: Medicare Other | Source: Ambulatory Visit | Attending: Radiation Oncology | Admitting: Radiation Oncology

## 2018-07-26 DIAGNOSIS — Z510 Encounter for antineoplastic radiation therapy: Secondary | ICD-10-CM | POA: Diagnosis not present

## 2018-07-27 ENCOUNTER — Ambulatory Visit
Admission: RE | Admit: 2018-07-27 | Discharge: 2018-07-27 | Disposition: A | Discharge status: Home / Self Care | Payer: Medicare Other | Source: Ambulatory Visit | Attending: Radiation Oncology | Admitting: Radiation Oncology

## 2018-07-27 DIAGNOSIS — Z510 Encounter for antineoplastic radiation therapy: Secondary | ICD-10-CM | POA: Diagnosis not present

## 2018-07-28 ENCOUNTER — Ambulatory Visit: Payer: Medicare Other

## 2018-07-31 ENCOUNTER — Ambulatory Visit: Payer: Medicare Other

## 2018-07-31 ENCOUNTER — Ambulatory Visit
Admission: RE | Admit: 2018-07-31 | Discharge: 2018-07-31 | Disposition: A | Discharge status: Home / Self Care | Payer: Medicare Other | Source: Ambulatory Visit | Attending: Radiation Oncology | Admitting: Radiation Oncology

## 2018-07-31 DIAGNOSIS — Z510 Encounter for antineoplastic radiation therapy: Secondary | ICD-10-CM | POA: Diagnosis not present

## 2018-08-01 ENCOUNTER — Ambulatory Visit: Payer: Medicare Other

## 2018-08-01 ENCOUNTER — Ambulatory Visit
Admission: RE | Admit: 2018-08-01 | Discharge: 2018-08-01 | Disposition: A | Discharge status: Home / Self Care | Payer: Medicare Other | Source: Ambulatory Visit | Attending: Radiation Oncology | Admitting: Radiation Oncology

## 2018-08-01 DIAGNOSIS — Z510 Encounter for antineoplastic radiation therapy: Secondary | ICD-10-CM | POA: Diagnosis not present

## 2018-08-02 ENCOUNTER — Ambulatory Visit
Admission: RE | Admit: 2018-08-02 | Discharge: 2018-08-02 | Disposition: A | Discharge status: Home / Self Care | Payer: Medicare Other | Source: Ambulatory Visit | Attending: Radiation Oncology | Admitting: Radiation Oncology

## 2018-08-02 DIAGNOSIS — Z510 Encounter for antineoplastic radiation therapy: Secondary | ICD-10-CM | POA: Diagnosis not present

## 2018-08-03 ENCOUNTER — Ambulatory Visit
Admission: RE | Admit: 2018-08-03 | Discharge: 2018-08-03 | Disposition: A | Discharge status: Home / Self Care | Payer: Medicare Other | Source: Ambulatory Visit | Attending: Radiation Oncology | Admitting: Radiation Oncology

## 2018-08-03 ENCOUNTER — Inpatient Hospital Stay: Payer: Medicare Other

## 2018-08-03 DIAGNOSIS — C50412 Malignant neoplasm of upper-outer quadrant of left female breast: Secondary | ICD-10-CM | POA: Diagnosis not present

## 2018-08-03 DIAGNOSIS — Z510 Encounter for antineoplastic radiation therapy: Secondary | ICD-10-CM | POA: Diagnosis not present

## 2018-08-03 DIAGNOSIS — Z170 Estrogen receptor positive status [ER+]: Principal | ICD-10-CM

## 2018-08-03 LAB — CBC
HCT: 39.9 % (ref 36.0–46.0)
Hemoglobin: 14 g/dL (ref 12.0–15.0)
MCH: 32.6 pg (ref 26.0–34.0)
MCHC: 35.1 g/dL (ref 30.0–36.0)
MCV: 92.8 fL (ref 80.0–100.0)
PLATELETS: 362 10*3/uL (ref 150–400)
RBC: 4.3 MIL/uL (ref 3.87–5.11)
RDW: 13 % (ref 11.5–15.5)
WBC: 8.6 10*3/uL (ref 4.0–10.5)
nRBC: 0 % (ref 0.0–0.2)

## 2018-08-04 ENCOUNTER — Ambulatory Visit
Admission: RE | Admit: 2018-08-04 | Discharge: 2018-08-04 | Disposition: A | Discharge status: Home / Self Care | Payer: Medicare Other | Source: Ambulatory Visit | Attending: Radiation Oncology | Admitting: Radiation Oncology

## 2018-08-04 DIAGNOSIS — Z510 Encounter for antineoplastic radiation therapy: Secondary | ICD-10-CM | POA: Diagnosis not present

## 2018-08-07 ENCOUNTER — Ambulatory Visit
Admission: RE | Admit: 2018-08-07 | Discharge: 2018-08-07 | Disposition: A | Discharge status: Home / Self Care | Payer: Medicare Other | Source: Ambulatory Visit | Attending: Radiation Oncology | Admitting: Radiation Oncology

## 2018-08-07 DIAGNOSIS — Z510 Encounter for antineoplastic radiation therapy: Secondary | ICD-10-CM | POA: Diagnosis not present

## 2018-08-07 DIAGNOSIS — Z170 Estrogen receptor positive status [ER+]: Secondary | ICD-10-CM | POA: Diagnosis not present

## 2018-08-07 DIAGNOSIS — C50412 Malignant neoplasm of upper-outer quadrant of left female breast: Secondary | ICD-10-CM | POA: Insufficient documentation

## 2018-08-08 ENCOUNTER — Ambulatory Visit
Admission: RE | Admit: 2018-08-08 | Discharge: 2018-08-08 | Disposition: A | Discharge status: Home / Self Care | Payer: Medicare Other | Source: Ambulatory Visit | Attending: Radiation Oncology | Admitting: Radiation Oncology

## 2018-08-08 DIAGNOSIS — Z510 Encounter for antineoplastic radiation therapy: Secondary | ICD-10-CM | POA: Diagnosis not present

## 2018-08-09 ENCOUNTER — Ambulatory Visit
Admission: RE | Admit: 2018-08-09 | Discharge: 2018-08-09 | Disposition: A | Discharge status: Home / Self Care | Payer: Medicare Other | Source: Ambulatory Visit | Attending: Radiation Oncology | Admitting: Radiation Oncology

## 2018-08-09 ENCOUNTER — Ambulatory Visit: Payer: Medicare Other

## 2018-08-09 DIAGNOSIS — Z510 Encounter for antineoplastic radiation therapy: Secondary | ICD-10-CM | POA: Diagnosis not present

## 2018-08-10 ENCOUNTER — Ambulatory Visit
Admission: RE | Admit: 2018-08-10 | Discharge: 2018-08-10 | Disposition: A | Discharge status: Home / Self Care | Payer: Medicare Other | Source: Ambulatory Visit | Attending: Radiation Oncology | Admitting: Radiation Oncology

## 2018-08-10 ENCOUNTER — Ambulatory Visit: Payer: Medicare Other

## 2018-08-10 DIAGNOSIS — Z510 Encounter for antineoplastic radiation therapy: Secondary | ICD-10-CM | POA: Diagnosis not present

## 2018-08-11 ENCOUNTER — Ambulatory Visit
Admission: RE | Admit: 2018-08-11 | Discharge: 2018-08-11 | Disposition: A | Discharge status: Home / Self Care | Payer: Medicare Other | Source: Ambulatory Visit | Attending: Radiation Oncology | Admitting: Radiation Oncology

## 2018-08-11 DIAGNOSIS — Z510 Encounter for antineoplastic radiation therapy: Secondary | ICD-10-CM | POA: Diagnosis not present

## 2018-08-17 ENCOUNTER — Other Ambulatory Visit: Payer: Self-pay

## 2018-08-17 ENCOUNTER — Ambulatory Visit
Admission: RE | Admit: 2018-08-17 | Discharge: 2018-08-17 | Disposition: A | Discharge status: Home / Self Care | Payer: Medicare Other | Source: Ambulatory Visit | Attending: Oncology | Admitting: Oncology

## 2018-08-17 ENCOUNTER — Other Ambulatory Visit: Payer: Medicare Other

## 2018-08-17 DIAGNOSIS — C50412 Malignant neoplasm of upper-outer quadrant of left female breast: Secondary | ICD-10-CM | POA: Insufficient documentation

## 2018-08-17 DIAGNOSIS — Z170 Estrogen receptor positive status [ER+]: Secondary | ICD-10-CM | POA: Diagnosis present

## 2018-08-17 DIAGNOSIS — Z780 Asymptomatic menopausal state: Secondary | ICD-10-CM | POA: Insufficient documentation

## 2018-08-21 ENCOUNTER — Telehealth: Payer: Self-pay | Admitting: *Deleted

## 2018-08-21 ENCOUNTER — Inpatient Hospital Stay: Payer: Medicare Other

## 2018-08-21 ENCOUNTER — Inpatient Hospital Stay: Payer: Medicare Other | Admitting: Oncology

## 2018-08-21 NOTE — Telephone Encounter (Signed)
Have been contacted about moving up her appointment with the coronavirus going on that were trying to decrease the amount of people coming in to the office.  Patient was needing to start on her Arimidex and I would like to call within but according to what I see in the computer there are 2 pharmacies once a [facility] in [place] and once a [facility] on [address] we just need to know which 1 send the prescription into.  Also I left on the voicemail about her taking calcium 600 mg/vitamin D 400 IU.  She would take 1 tablet twice a day when she starts her Arimidex/will await for patient to call me back to tell me which pharmacy to send it into

## 2018-09-05 ENCOUNTER — Telehealth: Payer: Self-pay | Admitting: *Deleted

## 2018-09-05 MED ORDER — ANASTROZOLE 1 MG PO TABS: 1.0000 mg | ORAL_TABLET | Freq: Every day | ORAL | 3 refills | Status: DC

## 2018-09-05 NOTE — Telephone Encounter (Signed)
Called patient to ask what pharmacy to use to send your anastrozole. No answer and left message. I had already tried already and she did not call me back. I reviewed several meds and it [facility] to [facility] [address] so I left her a message that I was sending the med to that pharamacy and wanted her to pick it and start taking 1 pill daily. Also she was suppose to start calcium and vit. D with this pill.  She had received a packet of the info on the drug and the calcium and vit d but I would be happy to go over it again to make sure she understands and does not have any questions. I left my desk number to call me back

## 2018-09-11 ENCOUNTER — Telehealth: Payer: Self-pay | Admitting: *Deleted

## 2018-09-11 NOTE — Telephone Encounter (Signed)
Pt called to say she will get rx. She does not know when her next appt is for radiation and rao. I looked it up and she has appt 5/18 at 2:30 with radiation and 6/16 10:45 for lab and see md. I left this info on her voicemail and will mail out the appts to her also

## 2018-09-13 ENCOUNTER — Ambulatory Visit: Payer: Medicare Other | Admitting: Radiation Oncology

## 2018-09-21 ENCOUNTER — Encounter: Payer: Self-pay | Admitting: Oncology

## 2018-10-23 ENCOUNTER — Other Ambulatory Visit: Payer: Self-pay | Admitting: *Deleted

## 2018-10-23 ENCOUNTER — Other Ambulatory Visit: Payer: Self-pay | Admitting: Gastroenterology

## 2018-10-23 ENCOUNTER — Encounter: Payer: Self-pay | Admitting: Radiation Oncology

## 2018-10-23 ENCOUNTER — Ambulatory Visit
Admission: RE | Admit: 2018-10-23 | Discharge: 2018-10-23 | Disposition: A | Discharge status: Home / Self Care | Payer: Medicare Other | Source: Ambulatory Visit | Attending: Radiation Oncology | Admitting: Radiation Oncology

## 2018-10-23 ENCOUNTER — Other Ambulatory Visit: Payer: Self-pay

## 2018-10-23 VITALS — BP 131/90 | HR 85 | Temp 96.6°F | Resp 18 | Wt 188.4 lb

## 2018-10-23 DIAGNOSIS — Z170 Estrogen receptor positive status [ER+]: Secondary | ICD-10-CM | POA: Insufficient documentation

## 2018-10-23 DIAGNOSIS — C50412 Malignant neoplasm of upper-outer quadrant of left female breast: Secondary | ICD-10-CM | POA: Insufficient documentation

## 2018-10-23 DIAGNOSIS — Z79811 Long term (current) use of aromatase inhibitors: Secondary | ICD-10-CM | POA: Insufficient documentation

## 2018-10-23 DIAGNOSIS — Z923 Personal history of irradiation: Secondary | ICD-10-CM | POA: Insufficient documentation

## 2018-10-23 NOTE — Progress Notes (Signed)
Radiation Oncology Follow up Note  Name: Tiffany Mathews   Date:   10/23/2018 MRN:  [id number] DOB: Dec 03, 1960    This 58 y.o. female presents to the clinic today for 29-monthfollow-up status post whole breast radiation to her left breast for stage I ER PR positive invasive mammary carcinoma.  REFERRING PROVIDER: MCletis Athens MD  HPI: Patient is a 58year old female now seen now 2 months having completed whole breast radiation to her left breast for stage Ia (T1b N0 M0) ER PR positive HER-2 negative invasive mammary carcinoma.  She is seen today in routine follow-up she is doing well.  She specifically denies breast tenderness cough or bone pain..  She has been started on arimadex tolerating that well without side effect.  COMPLICATIONS OF TREATMENT: none  FOLLOW UP COMPLIANCE: keeps appointments   PHYSICAL EXAM:  BP 131/90 (BP Location: Left Arm, Patient Position: Sitting)   Pulse 85   Temp (!) 96.6 F (35.9 C) (Tympanic)   Resp 18   Wt 188 lb 6.1 oz (85.5 kg)   BMI 31.35 kg/m  Lungs are clear to A&P cardiac examination essentially unremarkable with regular rate and rhythm. No dominant mass or nodularity is noted in either breast in 2 positions examined. Incision is well-healed. No axillary or supraclavicular adenopathy is appreciated. Cosmetic result is excellent.  Well-developed well-nourished patient in NAD. HEENT reveals PERLA, EOMI, discs not visualized.  Oral cavity is clear. No oral mucosal lesions are identified. Neck is clear without evidence of cervical or supraclavicular adenopathy. Lungs are clear to A&P. Cardiac examination is essentially unremarkable with regular rate and rhythm without murmur rub or thrill. Abdomen is benign with no organomegaly or masses noted. Motor sensory and DTR levels are equal and symmetric in the upper and lower extremities. Cranial nerves II through XII are grossly intact. Proprioception is intact. No peripheral adenopathy or edema is identified. No  motor or sensory levels are noted. Crude visual fields are within normal range.  RADIOLOGY RESULTS: No current films for review  PLAN: Present time patient is doing well with no evidence of disease 2 months out.  She is well-healed.  She is currently on arimadex tolerating that well without side effect.  I am pleased with her overall progress.  Of asked to see her back in 4 to 5 months for follow-up.  Patient knows to call at anytime with any concerns.  I would like to take this opportunity to thank you for allowing me to participate in the care of your patient..Noreene Filbert MD

## 2018-10-26 NOTE — Telephone Encounter (Signed)
Pt is calling she needs refill   rx pantoprazole (PROTONIX) 40 MG tablet   [facility] [address]

## 2018-11-07 ENCOUNTER — Encounter: Payer: Self-pay | Admitting: Oncology

## 2018-11-20 ENCOUNTER — Other Ambulatory Visit: Payer: Self-pay

## 2018-11-21 ENCOUNTER — Inpatient Hospital Stay (HOSPITAL_BASED_OUTPATIENT_CLINIC_OR_DEPARTMENT_OTHER): Payer: Medicare Other | Admitting: Oncology

## 2018-11-21 ENCOUNTER — Encounter: Payer: Self-pay | Admitting: Oncology

## 2018-11-21 ENCOUNTER — Other Ambulatory Visit: Payer: Self-pay

## 2018-11-21 ENCOUNTER — Inpatient Hospital Stay: Payer: Medicare Other | Attending: Oncology

## 2018-11-21 VITALS — BP 135/83 | HR 100 | Temp 98.1°F | Ht 65.0 in | Wt 187.0 lb

## 2018-11-21 DIAGNOSIS — K219 Gastro-esophageal reflux disease without esophagitis: Secondary | ICD-10-CM | POA: Insufficient documentation

## 2018-11-21 DIAGNOSIS — Z170 Estrogen receptor positive status [ER+]: Secondary | ICD-10-CM | POA: Diagnosis not present

## 2018-11-21 DIAGNOSIS — M797 Fibromyalgia: Secondary | ICD-10-CM | POA: Insufficient documentation

## 2018-11-21 DIAGNOSIS — E785 Hyperlipidemia, unspecified: Secondary | ICD-10-CM

## 2018-11-21 DIAGNOSIS — K449 Diaphragmatic hernia without obstruction or gangrene: Secondary | ICD-10-CM

## 2018-11-21 DIAGNOSIS — Z803 Family history of malignant neoplasm of breast: Secondary | ICD-10-CM | POA: Insufficient documentation

## 2018-11-21 DIAGNOSIS — G4730 Sleep apnea, unspecified: Secondary | ICD-10-CM

## 2018-11-21 DIAGNOSIS — F17210 Nicotine dependence, cigarettes, uncomplicated: Secondary | ICD-10-CM | POA: Insufficient documentation

## 2018-11-21 DIAGNOSIS — F419 Anxiety disorder, unspecified: Secondary | ICD-10-CM | POA: Diagnosis not present

## 2018-11-21 DIAGNOSIS — F319 Bipolar disorder, unspecified: Secondary | ICD-10-CM

## 2018-11-21 DIAGNOSIS — I10 Essential (primary) hypertension: Secondary | ICD-10-CM | POA: Diagnosis not present

## 2018-11-21 DIAGNOSIS — Z79899 Other long term (current) drug therapy: Secondary | ICD-10-CM | POA: Insufficient documentation

## 2018-11-21 DIAGNOSIS — Z79811 Long term (current) use of aromatase inhibitors: Secondary | ICD-10-CM | POA: Insufficient documentation

## 2018-11-21 DIAGNOSIS — Z5181 Encounter for therapeutic drug level monitoring: Secondary | ICD-10-CM

## 2018-11-21 DIAGNOSIS — C50412 Malignant neoplasm of upper-outer quadrant of left female breast: Secondary | ICD-10-CM | POA: Diagnosis present

## 2018-11-21 LAB — COMPREHENSIVE METABOLIC PANEL
ALT: 16 U/L (ref 0–44)
AST: 16 U/L (ref 15–41)
Albumin: 4.3 g/dL (ref 3.5–5.0)
Alkaline Phosphatase: 54 U/L (ref 38–126)
Anion gap: 10 (ref 5–15)
BUN: 16 mg/dL (ref 6–20)
CO2: 25 mmol/L (ref 22–32)
Calcium: 9.1 mg/dL (ref 8.9–10.3)
Chloride: 106 mmol/L (ref 98–111)
Creatinine, Ser: 0.79 mg/dL (ref 0.44–1.00)
GFR calc Af Amer: >60 mL/min (ref >60)
GFR calc non Af Amer: >60 mL/min (ref >60)
Glucose, Bld: 116 mg/dL — ABNORMAL HIGH (ref 70–99)
Potassium: 3.7 mmol/L (ref 3.5–5.1)
Sodium: 141 mmol/L (ref 135–145)
Total Bilirubin: 0.6 mg/dL (ref 0.3–1.2)
Total Protein: 7.4 g/dL (ref 6.5–8.1)

## 2018-11-21 NOTE — Progress Notes (Signed)
Patient wanted to know when she was considered "cancer free". Patient also wanted to know for how long she should continue taking her Anastrazole. Patient's last Bone density was done on 08/17/2018.

## 2018-11-22 NOTE — Progress Notes (Signed)
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 Approximately 1.2 cm lateral to this mass were 2-3 tightly clustered hypoechoic nodules together measuring 29 x 0.2 x 0.8 cm. There was also an other hypoechoic area which was suggestive of fibrocystic tissue. Several other tiny hypoechoic masses were noted with prominent ducts which could represent fibrocystic change. DCIS is also a concern. Ultrasound of the left axilla did not reveal any pathologic adenopathy. Patient underwent core biopsy of the dominant mass as well as the cluster of nodules immediately adjacent to it. 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 Biopsy of the second breast mass was negative for atypia and malignancy.  MRI of bilateral breasts on 04/11/2018 showed 0.8 cm biopsy-proven malignancy in the upper outer quadrant of the left breast. Second mass in the upper outer quadrant of the left breast which is known and pathology to be benign and enhances to a lesser degree than does the biopsy malignancy.  Patient underwent lumpectomy and sentinel lymph node biopsy on 04/19/2018. Final pathology showed invasive mammary carcinoma, grade 1, 10 mm. 2 sentinel lymph  nodes were negative for malignancy. Negative margins. ER PR greater than 90% positive. HER-2/neu negative. pT1b pN0  Patient did undergo genetic testing which did not reveal any mutation.   Interval history- she is tolerating arimidex well and reports no significant side effects. She is taking calcium and vit D as well.   ECOG PS- 0 Pain scale- 0   Review of systems- Review of Systems  Constitutional: Negative for chills, fever, malaise/fatigue and weight loss.  HENT: Negative for congestion, ear discharge and nosebleeds.   Eyes: Negative for blurred vision.  Respiratory: Negative for cough, hemoptysis, sputum production, shortness of breath and wheezing.   Cardiovascular: Negative for chest pain, palpitations, orthopnea and claudication.  Gastrointestinal: Negative for abdominal pain, blood in stool, constipation, diarrhea, heartburn, melena, nausea and vomiting.  Genitourinary: Negative for dysuria, flank pain, frequency, hematuria and urgency.  Musculoskeletal: Negative for back pain, joint pain and myalgias.  Skin: Negative for rash.  Neurological: Negative for dizziness, tingling, focal weakness, seizures, weakness and headaches.  Endo/Heme/Allergies: Does not bruise/bleed easily.  Psychiatric/Behavioral: Negative for depression and suicidal ideas. The patient does not have insomnia.       Allergies  Allergen Reactions  . Lamictal [Lamotrigine] Rash and Other (See Comments)  . Depakote [Divalproex Sodium] Other (See Comments)    Severe hair loss  . Lithium Other (See Comments)    Unknown; "It just made me feel bad."     Past Medical History:  Diagnosis Date  . Anxiety   . Arthritis   . Bipolar 1 disorder ([place])   . Breast cancer ([place])   . Depression   . Fibromyalgia   . GERD (gastroesophageal reflux disease)   .  History of hiatal hernia   . Hyperlipidemia   . Hypertension   . Multiple thyroid nodules    benign  . Sleep apnea      Past Surgical History:   Procedure Laterality Date  . ABLATION  04/2011  . BREAST BIOPSY Right 2009   -  . BREAST LUMPECTOMY Left 04/19/2018  . CESAREAN SECTION     x3  . CHOLECYSTECTOMY    . GALLBLADDER SURGERY    . LYSIS OF ADHESION  05/27/2016   Procedure: LYSIS OF ADHESION;  Surgeon: Clayburn Pert, MD;  Location: [facility] ORS;  Service: General;;  . PARTIAL MASTECTOMY WITH NEEDLE LOCALIZATION Left 04/19/2018   Procedure: PARTIAL MASTECTOMY WITH NEEDLE LOCALIZATION;  Surgeon: Herbert Pun, MD;  Location: [facility] ORS;  Service: General;  Laterality: Left;  . ROBOTIC ASSISTED LAPAROSCOPIC CHOLECYSTECTOMY N/A 05/27/2016   Procedure: ROBOTIC ASSISTED LAPAROSCOPIC CHOLECYSTECTOMY;  Surgeon: Clayburn Pert, MD;  Location: [facility] ORS;  Service: General;  Laterality: N/A;  . SENTINEL NODE BIOPSY Left 04/19/2018   Procedure: SENTINEL NODE BIOPSY;  Surgeon: Herbert Pun, MD;  Location: [facility] ORS;  Service: General;  Laterality: Left;  . SHOULDER ARTHROSCOPY  2000  . TUBAL LIGATION      Social History   Socioeconomic History  . Marital status: Divorced    Spouse name: Not on file  . Number of children: Not on file  . Years of education: Not on file  . Highest education level: Not on file  Occupational History  . Not on file  Social Needs  . Financial resource strain: Not on file  . Food insecurity    Worry: Not on file    Inability: Not on file  . Transportation needs    Medical: Not on file    Non-medical: Not on file  Tobacco Use  . Smoking status: Current Every Day Smoker    Packs/day: 1.00    Years: 20.00    Pack years: 20.00  . Smokeless tobacco: Never Used  Substance and Sexual Activity  . Alcohol use: No  . Drug use: No  . Sexual activity: Not Currently  Lifestyle  . Physical activity    Days per week: Not on file    Minutes per session: Not on file  . Stress: Not on file  Relationships  . Social Herbalist on phone: Not on file    Gets together: Not on file     Attends religious service: Not on file    Active member of club or organization: Not on file    Attends meetings of clubs or organizations: Not on file    Relationship status: Not on file  . Intimate partner violence    Fear of current or ex partner: Not on file    Emotionally abused: Not on file    Physically abused: Not on file    Forced sexual activity: Not on file  Other Topics Concern  . Not on file  Social History Narrative  . Not on file    Family History  Problem Relation Age of Onset  . Thyroid cancer Mother        dx 30s; currently 42  . Hypertension Mother   . Diabetes Mother   . Breast cancer Maternal Aunt 74       deceased 31s  . Alzheimer's disease Father        deceased 68  . Stroke Father   . Drug abuse Sister        deceased  62  . Diabetes Brother   . Hypertension Brother   . Heart attack Maternal Grandfather   . Diabetes Brother   . Hypertension Brother   . Alcohol abuse Brother   . Schizophrenia Brother   . Stomach cancer Paternal Aunt        age at dx unknown  . Stomach cancer Paternal Uncle        age at dx unknown  . Cancer Other        daughter of sister who died at 72; breast ca in 38s and ovarian cancer at 11; currently 20s     Current Outpatient Medications:  .  anastrozole (ARIMIDEX) 1 MG tablet, Take 1 tablet (1 mg total) by mouth daily., Disp: 30 tablet, Rfl: 3 .  aspirin EC 81 MG tablet, Take 81 mg by mouth daily. , Disp: , Rfl:  .  atorvastatin (LIPITOR) 40 MG tablet, Take 40 mg by mouth daily. , Disp: , Rfl:  .  clonazePAM (KLONOPIN) 0.5 MG tablet, Take 0.5 mg by mouth 2 (two) times daily. , Disp: , Rfl:  .  cyclobenzaprine (FLEXERIL) 10 MG tablet, Take 10 mg by mouth at bedtime as needed for muscle spasms. , Disp: , Rfl:  .  DULoxetine (CYMBALTA) 20 MG capsule, Take 20 mg by mouth daily., Disp: , Rfl:  .  gabapentin (NEURONTIN) 300 MG capsule, Take 300 mg by mouth 3 (three) times daily. , Disp: , Rfl: 1 .  loratadine (CLARITIN) 10  MG tablet, Take 10 mg by mouth daily as needed for allergies., Disp: , Rfl: 3 .  Multiple Vitamins-Minerals (MULTIVITAMIN WITH MINERALS) tablet, Take 1 tablet by mouth daily., Disp: , Rfl:  .  pantoprazole (PROTONIX) 40 MG tablet, Take 1 tablet (40 mg total) by mouth daily. **PLEASE SCHEDULE FOLLOW UP APPT**, Disp: 90 tablet, Rfl: 1 .  traZODone (DESYREL) 100 MG tablet, Take 100 mg by mouth at bedtime as needed (sleep). , Disp: , Rfl: 1 .  triamcinolone cream (KENALOG) 0.1 %, Apply 1 application topically 2 (two) times daily as needed (for foot rash). , Disp: , Rfl:  .  ziprasidone (GEODON) 40 MG capsule, Take 40 mg by mouth every evening. , Disp: , Rfl:  .  losartan (COZAAR) 100 MG tablet, Take 100 mg by mouth 2 (two) times daily. , Disp: , Rfl:   Physical exam:  Vitals:   11/21/18 1051  BP: 135/83  Pulse: 100  Temp: 98.1 F (36.7 C)  TempSrc: Tympanic  Weight: 187 lb (84.8 kg)  Height: _0  (1.651 m)   Physical Exam Constitutional:      General: She is not in acute distress. HENT:     Head: Normocephalic and atraumatic.  Eyes:     Pupils: Pupils are equal, round, and reactive to light.  Neck:     Musculoskeletal: Normal range of motion.  Cardiovascular:     Rate and Rhythm: Normal rate and regular rhythm.     Heart sounds: Normal heart sounds.  Pulmonary:     Effort: Pulmonary effort is normal.     Breath sounds: Normal breath sounds.  Abdominal:     General: Bowel sounds are normal.     Palpations: Abdomen is soft.  Skin:    General: Skin is warm and dry.  Neurological:     Mental Status: She is alert and oriented to person, place, and time.      CMP Latest Ref Rng & Units 11/21/2018  Glucose 70 - 99 mg/dL  116(H)  BUN 6 - 20 mg/dL 16  Creatinine 0.44 - 1.00 mg/dL 0.79  Sodium 135 - 145 mmol/L 141  Potassium 3.5 - 5.1 mmol/L 3.7  Chloride 98 - 111 mmol/L 106  CO2 22 - 32 mmol/L 25  Calcium 8.9 - 10.3 mg/dL 9.1  Total Protein 6.5 - 8.1 g/dL 7.4  Total Bilirubin  0.3 - 1.2 mg/dL 0.6  Alkaline Phos 38 - 126 U/L 54  AST 15 - 41 U/L 16  ALT 0 - 44 U/L 16   CBC Latest Ref Rng & Units 08/03/2018  WBC 4.0 - 10.5 K/uL 8.6  Hemoglobin 12.0 - 15.0 g/dL 14.0  Hematocrit 36.0 - 46.0 % 39.9  Platelets 150 - 400 K/uL 362     Assessment and plan- Patient is a 58 y.o. female with invasive mammary carcinoma of the left breast pathological prognostic stage Ia pT1b pN0 cM0 ER PR positive HER-2/neu negative status post lumpectomy.this is a routine f/u of breast cancer on arimidex  Baseline bone density scan is normal. Overall tolerating arimidex well with no significant side effects.  I will see her back in 3 months no labs. cmp today was normal    Visit Diagnosis 1. Visit for monitoring Arimidex therapy   2. Malignant neoplasm of upper-outer quadrant of left breast in female, estrogen receptor positive ([place])      Dr. Randa Evens, MD, MPH [facility] [phone] 11/22/2018 8:36 AM

## 2018-12-04 ENCOUNTER — Other Ambulatory Visit: Payer: Self-pay | Admitting: *Deleted

## 2018-12-04 MED ORDER — ANASTROZOLE 1 MG PO TABS: 1.0000 mg | ORAL_TABLET | Freq: Every day | ORAL | 0 refills | Status: DC

## 2019-02-22 ENCOUNTER — Inpatient Hospital Stay: Payer: Medicare Other | Attending: Oncology | Admitting: Oncology

## 2019-02-22 DIAGNOSIS — I10 Essential (primary) hypertension: Secondary | ICD-10-CM | POA: Insufficient documentation

## 2019-02-22 DIAGNOSIS — M129 Arthropathy, unspecified: Secondary | ICD-10-CM | POA: Insufficient documentation

## 2019-02-22 DIAGNOSIS — M797 Fibromyalgia: Secondary | ICD-10-CM | POA: Insufficient documentation

## 2019-02-22 DIAGNOSIS — Z79811 Long term (current) use of aromatase inhibitors: Secondary | ICD-10-CM | POA: Insufficient documentation

## 2019-02-22 DIAGNOSIS — K219 Gastro-esophageal reflux disease without esophagitis: Secondary | ICD-10-CM | POA: Insufficient documentation

## 2019-02-22 DIAGNOSIS — Z7982 Long term (current) use of aspirin: Secondary | ICD-10-CM | POA: Insufficient documentation

## 2019-02-22 DIAGNOSIS — F17210 Nicotine dependence, cigarettes, uncomplicated: Secondary | ICD-10-CM | POA: Insufficient documentation

## 2019-02-22 DIAGNOSIS — F418 Other specified anxiety disorders: Secondary | ICD-10-CM | POA: Insufficient documentation

## 2019-02-22 DIAGNOSIS — C50812 Malignant neoplasm of overlapping sites of left female breast: Secondary | ICD-10-CM | POA: Insufficient documentation

## 2019-02-22 DIAGNOSIS — Z79899 Other long term (current) drug therapy: Secondary | ICD-10-CM | POA: Insufficient documentation

## 2019-02-22 DIAGNOSIS — E785 Hyperlipidemia, unspecified: Secondary | ICD-10-CM | POA: Insufficient documentation

## 2019-02-22 DIAGNOSIS — F319 Bipolar disorder, unspecified: Secondary | ICD-10-CM | POA: Insufficient documentation

## 2019-02-22 DIAGNOSIS — Z170 Estrogen receptor positive status [ER+]: Secondary | ICD-10-CM | POA: Insufficient documentation

## 2019-02-22 DIAGNOSIS — G4730 Sleep apnea, unspecified: Secondary | ICD-10-CM | POA: Insufficient documentation

## 2019-02-23 ENCOUNTER — Other Ambulatory Visit: Payer: Self-pay | Admitting: General Surgery

## 2019-02-23 DIAGNOSIS — Z853 Personal history of malignant neoplasm of breast: Secondary | ICD-10-CM

## 2019-02-26 ENCOUNTER — Other Ambulatory Visit: Payer: Self-pay | Admitting: Oncology

## 2019-02-26 ENCOUNTER — Ambulatory Visit (INDEPENDENT_AMBULATORY_CARE_PROVIDER_SITE_OTHER): Payer: Medicare Other | Admitting: Gastroenterology

## 2019-02-26 ENCOUNTER — Other Ambulatory Visit: Payer: Self-pay

## 2019-02-26 ENCOUNTER — Encounter: Payer: Self-pay | Admitting: Gastroenterology

## 2019-02-26 ENCOUNTER — Other Ambulatory Visit: Payer: Self-pay | Admitting: *Deleted

## 2019-02-26 VITALS — BP 135/79 | HR 89 | Temp 97.1°F | Ht 65.0 in | Wt 170.0 lb

## 2019-02-26 DIAGNOSIS — K219 Gastro-esophageal reflux disease without esophagitis: Secondary | ICD-10-CM

## 2019-02-26 MED ORDER — ANASTROZOLE 1 MG PO TABS: 1.0000 mg | ORAL_TABLET | Freq: Every day | ORAL | 0 refills | Status: DC

## 2019-02-26 NOTE — Progress Notes (Signed)
Primary Care Physician: Cletis Athens, MD  Primary Gastroenterologist:  Dr. Lucilla Lame  Chief Complaint  Patient presents with  . Medication Refill    HPI: Tiffany Mathews is a 58 y.o. female here with a history of GERD.  The patient has been well controlled on her Protonix.  The patient had a colonoscopy in 2015 for a screening exam.  The patient had no polyps at that time.  The patient now tells me that her sister who is younger than her had polyps removed during her colonoscopy and needs to have a colonoscopy every 5 years.  The patient is now concerned that she may need a colonoscopy more frequently because of her sister's history.  The patient denies any black stools bloody stools nausea vomiting or abdominal pain.  There is no report of any dysphasia.  Current Outpatient Medications  Medication Sig Dispense Refill  . anastrozole (ARIMIDEX) 1 MG tablet Take 1 tablet (1 mg total) by mouth daily. 90 tablet 0  . aspirin EC 81 MG tablet Take 81 mg by mouth daily.     Marland Kitchen atorvastatin (LIPITOR) 40 MG tablet Take 40 mg by mouth daily.     . clonazePAM (KLONOPIN) 0.5 MG tablet Take 0.5 mg by mouth 2 (two) times daily.     . cyclobenzaprine (FLEXERIL) 10 MG tablet Take 10 mg by mouth at bedtime as needed for muscle spasms.     . DULoxetine (CYMBALTA) 20 MG capsule Take 20 mg by mouth daily.    Marland Kitchen gabapentin (NEURONTIN) 300 MG capsule Take 300 mg by mouth 3 (three) times daily.   1  . loratadine (CLARITIN) 10 MG tablet Take 10 mg by mouth daily as needed for allergies.  3  . losartan (COZAAR) 100 MG tablet Take 100 mg by mouth 2 (two) times daily.     . Multiple Vitamins-Minerals (MULTIVITAMIN WITH MINERALS) tablet Take 1 tablet by mouth daily.    . pantoprazole (PROTONIX) 40 MG tablet Take 1 tablet (40 mg total) by mouth daily. **PLEASE SCHEDULE FOLLOW UP APPT** 90 tablet 1  . traZODone (DESYREL) 100 MG tablet Take 100 mg by mouth at bedtime as needed (sleep).   1  . triamcinolone cream  (KENALOG) 0.1 % Apply 1 application topically 2 (two) times daily as needed (for foot rash).     . ziprasidone (GEODON) 40 MG capsule Take 40 mg by mouth every evening.      No current facility-administered medications for this visit.     Allergies as of 02/26/2019 - Review Complete 02/26/2019  Allergen Reaction Noted  . Lamictal [lamotrigine] Rash and Other (See Comments) 03/20/2014  . Depakote [divalproex sodium] Other (See Comments) 03/20/2014  . Lithium Other (See Comments) 03/20/2014    ROS:  General: Negative for anorexia, weight loss, fever, chills, fatigue, weakness. ENT: Negative for hoarseness, difficulty swallowing , nasal congestion. CV: Negative for chest pain, angina, palpitations, dyspnea on exertion, peripheral edema.  Respiratory: Negative for dyspnea at rest, dyspnea on exertion, cough, sputum, wheezing.  GI: See history of present illness. GU:  Negative for dysuria, hematuria, urinary incontinence, urinary frequency, nocturnal urination.  Endo: Negative for unusual weight change.    Physical Examination:   BP 135/79   Pulse 89   Temp (!) 97.1 F (36.2 C) (Temporal)   Ht 5\' 5"  (1.651 m)   Wt 170 lb (77.1 kg)   BMI 28.29 kg/m   General: Well-nourished, well-developed in no acute distress.  Eyes: No icterus. Conjunctivae pink.  Mouth: Oropharyngeal mucosa moist and pink , no lesions erythema or exudate. Lungs: Clear to auscultation bilaterally. Non-labored. Heart: Regular rate and rhythm, no murmurs rubs or gallops.  Abdomen: Bowel sounds are normal, nontender, nondistended, no hepatosplenomegaly or masses, no abdominal bruits or hernia , no rebound or guarding.   Extremities: No lower extremity edema. No clubbing or deformities. Neuro: Alert and oriented x 3.  Grossly intact. Skin: Warm and dry, no jaundice.   Psych: Alert and cooperative, normal mood and affect.  Labs:    Imaging Studies: No results found.  Assessment and Plan:   Tiffany Mathews is  a 58 y.o. y/o female who comes in today with a history of reflux and she will have her medications renewed.  The patient also has a family history of colon polyps and  the patient has been told that she would have a colonoscopy every 5 years. She has been given refills for the next year and will be set up for a colonoscopy. I have discussed risks & benefits which include, but are not limited to, bleeding, infection, perforation & drug reaction.  The patient agrees with this plan & written consent will be obtained.       Lucilla Lame, MD. [name]   Note: This dictation was prepared with Dragon dictation along with smaller phrase technology. Any transcriptional errors that result from this process are unintentional.

## 2019-02-26 NOTE — H&P (View-Only) (Signed)
Primary Care Physician: Cletis Athens, MD  Primary Gastroenterologist:  Dr. Lucilla Lame  Chief Complaint  Patient presents with  . Medication Refill    HPI: Tiffany Mathews is a 58 y.o. female here with a history of GERD.  The patient has been well controlled on her Protonix.  The patient had a colonoscopy in 2015 for a screening exam.  The patient had no polyps at that time.  The patient now tells me that her sister who is younger than her had polyps removed during her colonoscopy and needs to have a colonoscopy every 5 years.  The patient is now concerned that she may need a colonoscopy more frequently because of her sister's history.  The patient denies any black stools bloody stools nausea vomiting or abdominal pain.  There is no report of any dysphasia.  Current Outpatient Medications  Medication Sig Dispense Refill  . anastrozole (ARIMIDEX) 1 MG tablet Take 1 tablet (1 mg total) by mouth daily. 90 tablet 0  . aspirin EC 81 MG tablet Take 81 mg by mouth daily.     Marland Kitchen atorvastatin (LIPITOR) 40 MG tablet Take 40 mg by mouth daily.     . clonazePAM (KLONOPIN) 0.5 MG tablet Take 0.5 mg by mouth 2 (two) times daily.     . cyclobenzaprine (FLEXERIL) 10 MG tablet Take 10 mg by mouth at bedtime as needed for muscle spasms.     . DULoxetine (CYMBALTA) 20 MG capsule Take 20 mg by mouth daily.    Marland Kitchen gabapentin (NEURONTIN) 300 MG capsule Take 300 mg by mouth 3 (three) times daily.   1  . loratadine (CLARITIN) 10 MG tablet Take 10 mg by mouth daily as needed for allergies.  3  . losartan (COZAAR) 100 MG tablet Take 100 mg by mouth 2 (two) times daily.     . Multiple Vitamins-Minerals (MULTIVITAMIN WITH MINERALS) tablet Take 1 tablet by mouth daily.    . pantoprazole (PROTONIX) 40 MG tablet Take 1 tablet (40 mg total) by mouth daily. **PLEASE SCHEDULE FOLLOW UP APPT** 90 tablet 1  . traZODone (DESYREL) 100 MG tablet Take 100 mg by mouth at bedtime as needed (sleep).   1  . triamcinolone cream  (KENALOG) 0.1 % Apply 1 application topically 2 (two) times daily as needed (for foot rash).     . ziprasidone (GEODON) 40 MG capsule Take 40 mg by mouth every evening.      No current facility-administered medications for this visit.     Allergies as of 02/26/2019 - Review Complete 02/26/2019  Allergen Reaction Noted  . Lamictal [lamotrigine] Rash and Other (See Comments) 03/20/2014  . Depakote [divalproex sodium] Other (See Comments) 03/20/2014  . Lithium Other (See Comments) 03/20/2014    ROS:  General: Negative for anorexia, weight loss, fever, chills, fatigue, weakness. ENT: Negative for hoarseness, difficulty swallowing , nasal congestion. CV: Negative for chest pain, angina, palpitations, dyspnea on exertion, peripheral edema.  Respiratory: Negative for dyspnea at rest, dyspnea on exertion, cough, sputum, wheezing.  GI: See history of present illness. GU:  Negative for dysuria, hematuria, urinary incontinence, urinary frequency, nocturnal urination.  Endo: Negative for unusual weight change.    Physical Examination:   BP 135/79   Pulse 89   Temp (!) 97.1 F (36.2 C) (Temporal)   Ht 5\' 5"  (1.651 m)   Wt 170 lb (77.1 kg)   BMI 28.29 kg/m   General: Well-nourished, well-developed in no acute distress.  Eyes: No icterus. Conjunctivae pink.  Mouth: Oropharyngeal mucosa moist and pink , no lesions erythema or exudate. Lungs: Clear to auscultation bilaterally. Non-labored. Heart: Regular rate and rhythm, no murmurs rubs or gallops.  Abdomen: Bowel sounds are normal, nontender, nondistended, no hepatosplenomegaly or masses, no abdominal bruits or hernia , no rebound or guarding.   Extremities: No lower extremity edema. No clubbing or deformities. Neuro: Alert and oriented x 3.  Grossly intact. Skin: Warm and dry, no jaundice.   Psych: Alert and cooperative, normal mood and affect.  Labs:    Imaging Studies: No results found.  Assessment and Plan:   Tiffany Mathews is  a 58 y.o. y/o female who comes in today with a history of reflux and she will have her medications renewed.  The patient also has a family history of colon polyps and  the patient has been told that she would have a colonoscopy every 5 years. She has been given refills for the next year and will be set up for a colonoscopy. I have discussed risks & benefits which include, but are not limited to, bleeding, infection, perforation & drug reaction.  The patient agrees with this plan & written consent will be obtained.       Lucilla Lame, MD. [name]   Note: This dictation was prepared with Dragon dictation along with smaller phrase technology. Any transcriptional errors that result from this process are unintentional.

## 2019-02-28 ENCOUNTER — Other Ambulatory Visit: Payer: Self-pay

## 2019-02-28 ENCOUNTER — Encounter: Payer: Self-pay | Admitting: Gastroenterology

## 2019-02-28 ENCOUNTER — Telehealth: Payer: Self-pay | Admitting: Gastroenterology

## 2019-02-28 DIAGNOSIS — Z8371 Family history of colonic polyps: Secondary | ICD-10-CM

## 2019-02-28 NOTE — Telephone Encounter (Signed)
Left vm for pt to inform her 03/09/19 will be okay for her colonoscopy. Order placed.

## 2019-02-28 NOTE — Telephone Encounter (Signed)
Patient called & would like to schedule a colonoscopy on 03-09-19. PLEASE CALL HER TO CONFIRM IF THIS IS OK.

## 2019-03-01 ENCOUNTER — Encounter: Payer: Self-pay | Admitting: *Deleted

## 2019-03-01 ENCOUNTER — Other Ambulatory Visit: Payer: Self-pay

## 2019-03-05 ENCOUNTER — Encounter: Payer: Self-pay | Admitting: Oncology

## 2019-03-05 ENCOUNTER — Inpatient Hospital Stay (HOSPITAL_BASED_OUTPATIENT_CLINIC_OR_DEPARTMENT_OTHER): Payer: Medicare Other | Admitting: Oncology

## 2019-03-05 ENCOUNTER — Other Ambulatory Visit: Payer: Self-pay

## 2019-03-05 VITALS — BP 151/96 | HR 90 | Temp 98.9°F | Ht 65.0 in | Wt 172.0 lb

## 2019-03-05 DIAGNOSIS — K219 Gastro-esophageal reflux disease without esophagitis: Secondary | ICD-10-CM | POA: Diagnosis not present

## 2019-03-05 DIAGNOSIS — Z7982 Long term (current) use of aspirin: Secondary | ICD-10-CM | POA: Diagnosis not present

## 2019-03-05 DIAGNOSIS — Z79811 Long term (current) use of aromatase inhibitors: Secondary | ICD-10-CM

## 2019-03-05 DIAGNOSIS — Z853 Personal history of malignant neoplasm of breast: Secondary | ICD-10-CM | POA: Diagnosis not present

## 2019-03-05 DIAGNOSIS — Z5181 Encounter for therapeutic drug level monitoring: Secondary | ICD-10-CM

## 2019-03-05 DIAGNOSIS — E785 Hyperlipidemia, unspecified: Secondary | ICD-10-CM | POA: Diagnosis not present

## 2019-03-05 DIAGNOSIS — F319 Bipolar disorder, unspecified: Secondary | ICD-10-CM | POA: Diagnosis not present

## 2019-03-05 DIAGNOSIS — M797 Fibromyalgia: Secondary | ICD-10-CM | POA: Diagnosis not present

## 2019-03-05 DIAGNOSIS — G4730 Sleep apnea, unspecified: Secondary | ICD-10-CM | POA: Diagnosis not present

## 2019-03-05 DIAGNOSIS — Z79899 Other long term (current) drug therapy: Secondary | ICD-10-CM | POA: Diagnosis not present

## 2019-03-05 DIAGNOSIS — Z170 Estrogen receptor positive status [ER+]: Secondary | ICD-10-CM | POA: Diagnosis not present

## 2019-03-05 DIAGNOSIS — C50812 Malignant neoplasm of overlapping sites of left female breast: Secondary | ICD-10-CM | POA: Diagnosis not present

## 2019-03-05 DIAGNOSIS — I10 Essential (primary) hypertension: Secondary | ICD-10-CM | POA: Diagnosis not present

## 2019-03-05 DIAGNOSIS — Z08 Encounter for follow-up examination after completed treatment for malignant neoplasm: Secondary | ICD-10-CM

## 2019-03-05 DIAGNOSIS — F418 Other specified anxiety disorders: Secondary | ICD-10-CM | POA: Diagnosis not present

## 2019-03-05 DIAGNOSIS — F17210 Nicotine dependence, cigarettes, uncomplicated: Secondary | ICD-10-CM | POA: Diagnosis not present

## 2019-03-05 DIAGNOSIS — M129 Arthropathy, unspecified: Secondary | ICD-10-CM | POA: Diagnosis not present

## 2019-03-05 MED ORDER — PEG 3350-KCL-NA BICARB-NACL 420 G PO SOLR: ORAL | 0 refills | Status: DC

## 2019-03-05 MED ORDER — ANASTROZOLE 1 MG PO TABS: 1.0000 mg | ORAL_TABLET | Freq: Every day | ORAL | 3 refills | Status: DC

## 2019-03-05 NOTE — Progress Notes (Signed)
Hematology/Oncology Consult note [facility]  [phone] [phone]  Patient Care Team: Cletis Athens, MD as PCP - General (Internal Medicine)   Name of the patient: Tiffany Mathews  [id number]  Apr 03, 1961   Date of visit: 03/05/19  Diagnosis-  stage IA invasive mammary carcinomaleft breastpathological prognostic stage pT1b pN0cM0 ER PR positive HER-2/neu negative   Chief complaint/ Reason for visit-routine follow-up of breast cancer on Arimidex  Heme/Onc history: patient is a 58 year old Hispanic female who underwent bilateral screening mammogram in September 2019 which showed a suspicious mass in the left breast 2 o'clock position measuring 0.8 x 0.5 x 0.8 cm in size. Approximately 1.2 cm lateral to this mass were 2-3 tightly clustered hypoechoic nodules together measuring 29 x 0.2 x 0.8 cm. There was also an other hypoechoic area which was suggestive of fibrocystic tissue. Several other tiny hypoechoic masses were noted with prominent ducts which could represent fibrocystic change. DCIS is also a concern. Ultrasound of the left axilla did not reveal any pathologic adenopathy. Patient underwent core biopsy of the dominant mass as well as the cluster of nodules immediately adjacent to it. 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 Biopsy of the second breast mass was negative for atypia and malignancy.  MRI of bilateral breasts on 04/11/2018 showed 0.8 cm biopsy-proven malignancy in the upper outer quadrant of the left breast. Second mass in the upper outer quadrant of the left breast which is known and pathology to be benign and enhances to a lesser degree than does the biopsy malignancy.  Patient underwent lumpectomy and sentinel lymph node biopsy on 04/19/2018. Final pathology showed invasive mammary carcinoma, grade 1, 10 mm. 2 sentinel  lymph nodes were negative for malignancy. Negative margins. ER PR greater than 90% positive. HER-2/neu negative. pT1b pN0  Patient did undergo genetic testing which did not reveal any mutation.    Interval history-patient continues to take Arimidex along with calcium and vitamin D daily.  Reports tolerating it well and denies any symptoms of fatigue arthralgias hot flashes or mood swings or other complaints  ECOG PS- 0 Pain scale- 0  Review of systems- Review of Systems  Constitutional: Negative for chills, fever, malaise/fatigue and weight loss.  HENT: Negative for congestion, ear discharge and nosebleeds.   Eyes: Negative for blurred vision.  Respiratory: Negative for cough, hemoptysis, sputum production, shortness of breath and wheezing.   Cardiovascular: Negative for chest pain, palpitations, orthopnea and claudication.  Gastrointestinal: Negative for abdominal pain, blood in stool, constipation, diarrhea, heartburn, melena, nausea and vomiting.  Genitourinary: Negative for dysuria, flank pain, frequency, hematuria and urgency.  Musculoskeletal: Negative for back pain, joint pain and myalgias.  Skin: Negative for rash.  Neurological: Negative for dizziness, tingling, focal weakness, seizures, weakness and headaches.  Endo/Heme/Allergies: Does not bruise/bleed easily.  Psychiatric/Behavioral: Negative for depression and suicidal ideas. The patient does not have insomnia.       Allergies  Allergen Reactions  . Lamictal [Lamotrigine] Rash and Other (See Comments)  . Depakote [Divalproex Sodium] Other (See Comments)    Severe hair loss  . Lithium Other (See Comments)    Unknown; "It just made me feel bad."     Past Medical History:  Diagnosis Date  . Anxiety   . Arthritis   . Bipolar 1 disorder ([place])   . Breast cancer ([place])   . Depression   . Fibromyalgia   .  GERD (gastroesophageal reflux disease)   . History of hiatal hernia   . Hydradenitis   . Hyperlipidemia    . Hypertension   . Multiple thyroid nodules    benign  . Sleep apnea    has CPAP, doesn't use  . Wears contact lenses      Past Surgical History:  Procedure Laterality Date  . ABLATION  04/2011  . BREAST BIOPSY Right 2009   -  . BREAST LUMPECTOMY Left 04/19/2018  . CESAREAN SECTION     x3  . CHOLECYSTECTOMY    . GALLBLADDER SURGERY    . LYSIS OF ADHESION  05/27/2016   Procedure: LYSIS OF ADHESION;  Surgeon: Clayburn Pert, MD;  Location: [facility] ORS;  Service: General;;  . PARTIAL MASTECTOMY WITH NEEDLE LOCALIZATION Left 04/19/2018   Procedure: PARTIAL MASTECTOMY WITH NEEDLE LOCALIZATION;  Surgeon: Herbert Pun, MD;  Location: [facility] ORS;  Service: General;  Laterality: Left;  . ROBOTIC ASSISTED LAPAROSCOPIC CHOLECYSTECTOMY N/A 05/27/2016   Procedure: ROBOTIC ASSISTED LAPAROSCOPIC CHOLECYSTECTOMY;  Surgeon: Clayburn Pert, MD;  Location: [facility] ORS;  Service: General;  Laterality: N/A;  . SENTINEL NODE BIOPSY Left 04/19/2018   Procedure: SENTINEL NODE BIOPSY;  Surgeon: Herbert Pun, MD;  Location: [facility] ORS;  Service: General;  Laterality: Left;  . SHOULDER ARTHROSCOPY  2000  . TUBAL LIGATION      Social History   Socioeconomic History  . Marital status: Divorced    Spouse name: Not on file  . Number of children: Not on file  . Years of education: Not on file  . Highest education level: Not on file  Occupational History  . Not on file  Social Needs  . Financial resource strain: Not on file  . Food insecurity    Worry: Not on file    Inability: Not on file  . Transportation needs    Medical: Not on file    Non-medical: Not on file  Tobacco Use  . Smoking status: Current Every Day Smoker    Packs/day: 1.00    Years: 20.00    Pack years: 20.00    Types: Cigarettes  . Smokeless tobacco: Never Used  Substance and Sexual Activity  . Alcohol use: No  . Drug use: No  . Sexual activity: Not Currently  Lifestyle  . Physical activity    Days per week:  Not on file    Minutes per session: Not on file  . Stress: Not on file  Relationships  . Social Herbalist on phone: Not on file    Gets together: Not on file    Attends religious service: Not on file    Active member of club or organization: Not on file    Attends meetings of clubs or organizations: Not on file    Relationship status: Not on file  . Intimate partner violence    Fear of current or ex partner: Not on file    Emotionally abused: Not on file    Physically abused: Not on file    Forced sexual activity: Not on file  Other Topics Concern  . Not on file  Social History Narrative  . Not on file    Family History  Problem Relation Age of Onset  . Thyroid cancer Mother        dx 36s; currently 51  . Hypertension Mother   . Diabetes Mother   . Breast cancer Maternal Aunt 34       deceased 42s  . Alzheimer's disease  Father        deceased 24  . Stroke Father   . Drug abuse Sister        deceased 45  . Diabetes Brother   . Hypertension Brother   . Heart attack Maternal Grandfather   . Diabetes Brother   . Hypertension Brother   . Alcohol abuse Brother   . Schizophrenia Brother   . Stomach cancer Paternal Aunt        age at dx unknown  . Stomach cancer Paternal Uncle        age at dx unknown  . Cancer Other        daughter of sister who died at 46; breast ca in 47s and ovarian cancer at 1; currently 2s     Current Outpatient Medications:  .  anastrozole (ARIMIDEX) 1 MG tablet, Take 1 tablet (1 mg total) by mouth daily., Disp: 90 tablet, Rfl: 3 .  aspirin EC 81 MG tablet, Take 81 mg by mouth daily. , Disp: , Rfl:  .  atorvastatin (LIPITOR) 40 MG tablet, Take 40 mg by mouth daily. , Disp: , Rfl:  .  clonazePAM (KLONOPIN) 0.5 MG tablet, Take 0.5 mg by mouth 2 (two) times daily. , Disp: , Rfl:  .  cyclobenzaprine (FLEXERIL) 10 MG tablet, Take 10 mg by mouth at bedtime as needed for muscle spasms. , Disp: , Rfl:  .  DULoxetine (CYMBALTA) 20 MG  capsule, Take 20 mg by mouth daily., Disp: , Rfl:  .  gabapentin (NEURONTIN) 300 MG capsule, Take 300 mg by mouth 3 (three) times daily. , Disp: , Rfl: 1 .  loratadine (CLARITIN) 10 MG tablet, Take 10 mg by mouth daily as needed for allergies., Disp: , Rfl: 3 .  losartan (COZAAR) 100 MG tablet, Take 100 mg by mouth 2 (two) times daily. , Disp: , Rfl:  .  minocycline (MINOCIN) 100 MG capsule, Take 100 mg by mouth 2 (two) times daily., Disp: , Rfl:  .  Multiple Vitamins-Minerals (MULTIVITAMIN WITH MINERALS) tablet, Take 1 tablet by mouth daily., Disp: , Rfl:  .  pantoprazole (PROTONIX) 40 MG tablet, Take 1 tablet (40 mg total) by mouth daily. **PLEASE SCHEDULE FOLLOW UP APPT**, Disp: 90 tablet, Rfl: 1 .  traZODone (DESYREL) 100 MG tablet, Take 100 mg by mouth at bedtime as needed (sleep). , Disp: , Rfl: 1 .  triamcinolone cream (KENALOG) 0.1 %, Apply 1 application topically 2 (two) times daily as needed (for foot rash). , Disp: , Rfl:  .  ziprasidone (GEODON) 40 MG capsule, Take 40 mg by mouth every evening. , Disp: , Rfl:   Physical exam:  Vitals:   03/05/19 1024  BP: (!) 151/96  Pulse: 90  Temp: 98.9 F (37.2 C)  TempSrc: Tympanic  Weight: 172 lb (78 kg)  Height: '5\' 5"'  (1.651 m)   Physical Exam Constitutional:      General: She is not in acute distress. HENT:     Head: Normocephalic and atraumatic.  Eyes:     Pupils: Pupils are equal, round, and reactive to light.  Neck:     Musculoskeletal: Normal range of motion.  Cardiovascular:     Rate and Rhythm: Normal rate and regular rhythm.     Heart sounds: Normal heart sounds.  Pulmonary:     Effort: Pulmonary effort is normal.     Breath sounds: Normal breath sounds.  Abdominal:     General: Bowel sounds are normal.     Palpations: Abdomen  is soft.  Skin:    General: Skin is warm and dry.  Neurological:     Mental Status: She is alert and oriented to person, place, and time.   Breast exam was performed in seated and lying  down position. Patient is status post left lumpectomy with a well-healed surgical scar. No evidence of any palpable masses. No evidence of axillary adenopathy. No evidence of any palpable masses or lumps in the right breast. No evidence of right axillary adenopathy   CMP Latest Ref Rng & Units 11/21/2018  Glucose 70 - 99 mg/dL 116(H)  BUN 6 - 20 mg/dL 16  Creatinine 0.44 - 1.00 mg/dL 0.79  Sodium 135 - 145 mmol/L 141  Potassium 3.5 - 5.1 mmol/L 3.7  Chloride 98 - 111 mmol/L 106  CO2 22 - 32 mmol/L 25  Calcium 8.9 - 10.3 mg/dL 9.1  Total Protein 6.5 - 8.1 g/dL 7.4  Total Bilirubin 0.3 - 1.2 mg/dL 0.6  Alkaline Phos 38 - 126 U/L 54  AST 15 - 41 U/L 16  ALT 0 - 44 U/L 16   CBC Latest Ref Rng & Units 08/03/2018  WBC 4.0 - 10.5 K/uL 8.6  Hemoglobin 12.0 - 15.0 g/dL 14.0  Hematocrit 36.0 - 46.0 % 39.9  Platelets 150 - 400 K/uL 362     Assessment and plan- Patient is a 58 y.o. female withinvasive mammary carcinoma of the left breast pathological prognostic stage Ia pT1b pN0 cM0 ER PR positive HER-2/neu negative status post lumpectomy.  This is a routine follow-up of breast cancer on Arimidex  Clinically she is doing well no signs symptoms of recurrence on today's exam.  She is due for mammogram next month.  She is tolerating Arimidex and calcium vitamin D well without any significant side effects.  It has been a year since her breast cancer diagnosis.  I will see her back in 6 months   Visit Diagnosis 1. Encounter for follow-up surveillance of breast cancer   2. Visit for monitoring Arimidex therapy      Dr. Randa Evens, MD, MPH [facility] [phone] 03/05/2019 10:41 AM

## 2019-03-05 NOTE — Progress Notes (Signed)
Patient stated that she had been doing well. Patient will need a refill on her anastrozole.

## 2019-03-06 ENCOUNTER — Other Ambulatory Visit
Admission: RE | Admit: 2019-03-06 | Discharge: 2019-03-06 | Disposition: A | Discharge status: Home / Self Care | Payer: Medicare Other | Source: Ambulatory Visit | Attending: Gastroenterology | Admitting: Gastroenterology

## 2019-03-06 DIAGNOSIS — Z01812 Encounter for preprocedural laboratory examination: Secondary | ICD-10-CM | POA: Diagnosis present

## 2019-03-06 DIAGNOSIS — Z20828 Contact with and (suspected) exposure to other viral communicable diseases: Secondary | ICD-10-CM | POA: Diagnosis not present

## 2019-03-06 LAB — SARS CORONAVIRUS 2 (TAT 6-24 HRS): SARS Coronavirus 2: NEGATIVE

## 2019-03-07 NOTE — Anesthesia Preprocedure Evaluation (Addendum)
Anesthesia Evaluation  Patient identified by MRN, date of birth, ID band Patient awake    Reviewed: Allergy & Precautions, NPO status , Patient's Chart, lab work & pertinent test results  History of Anesthesia Complications Negative for: history of anesthetic complications  Airway Mallampati: III   Neck ROM: Full    Dental no notable dental hx.    Pulmonary sleep apnea , Current Smoker (1/2 ppd)Patient did not abstain from smoking.,    Pulmonary exam normal breath sounds clear to auscultation       Cardiovascular hypertension, Normal cardiovascular exam Rhythm:Regular Rate:Normal     Neuro/Psych PSYCHIATRIC DISORDERS Anxiety Depression Bipolar Disorder negative neurological ROS     GI/Hepatic hiatal hernia, GERD  ,  Endo/Other  negative endocrine ROS  Renal/GU negative Renal ROS     Musculoskeletal  (+) Arthritis , Fibromyalgia -  Abdominal   Peds  Hematology Breast CA   Anesthesia Other Findings   Reproductive/Obstetrics                            Anesthesia Physical Anesthesia Plan  ASA: II  Anesthesia Plan: General   Post-op Pain Management:    Induction: Intravenous  PONV Risk Score and Plan: 2 and Propofol infusion and TIVA  Airway Management Planned: Natural Airway  Additional Equipment:   Intra-op Plan:   Post-operative Plan:   Informed Consent: I have reviewed the patients History and Physical, chart, labs and discussed the procedure including the risks, benefits and alternatives for the proposed anesthesia with the patient or authorized representative who has indicated his/her understanding and acceptance.       Plan Discussed with: CRNA  Anesthesia Plan Comments:        Anesthesia Quick Evaluation

## 2019-03-08 NOTE — Discharge Instructions (Signed)

## 2019-03-09 ENCOUNTER — Other Ambulatory Visit: Payer: Self-pay

## 2019-03-09 ENCOUNTER — Ambulatory Visit: Payer: Medicare Other | Admitting: Anesthesiology

## 2019-03-09 ENCOUNTER — Ambulatory Visit
Admission: RE | Admit: 2019-03-09 | Discharge: 2019-03-09 | Disposition: A | Discharge status: Home / Self Care | Payer: Medicare Other | Attending: Gastroenterology | Admitting: Gastroenterology

## 2019-03-09 ENCOUNTER — Encounter
Admission: RE | Disposition: A | Discharge status: Home / Self Care | Payer: Self-pay | Source: Home / Self Care | Attending: Gastroenterology

## 2019-03-09 DIAGNOSIS — K641 Second degree hemorrhoids: Secondary | ICD-10-CM | POA: Insufficient documentation

## 2019-03-09 DIAGNOSIS — M1990 Unspecified osteoarthritis, unspecified site: Secondary | ICD-10-CM | POA: Insufficient documentation

## 2019-03-09 DIAGNOSIS — Z79811 Long term (current) use of aromatase inhibitors: Secondary | ICD-10-CM | POA: Insufficient documentation

## 2019-03-09 DIAGNOSIS — Z7982 Long term (current) use of aspirin: Secondary | ICD-10-CM | POA: Insufficient documentation

## 2019-03-09 DIAGNOSIS — M797 Fibromyalgia: Secondary | ICD-10-CM | POA: Diagnosis not present

## 2019-03-09 DIAGNOSIS — Z8371 Family history of colonic polyps: Secondary | ICD-10-CM | POA: Diagnosis present

## 2019-03-09 DIAGNOSIS — Z1211 Encounter for screening for malignant neoplasm of colon: Secondary | ICD-10-CM | POA: Insufficient documentation

## 2019-03-09 DIAGNOSIS — F319 Bipolar disorder, unspecified: Secondary | ICD-10-CM | POA: Diagnosis not present

## 2019-03-09 DIAGNOSIS — Z79899 Other long term (current) drug therapy: Secondary | ICD-10-CM | POA: Insufficient documentation

## 2019-03-09 DIAGNOSIS — Z83719 Family history of colon polyps, unspecified: Secondary | ICD-10-CM

## 2019-03-09 DIAGNOSIS — K219 Gastro-esophageal reflux disease without esophagitis: Secondary | ICD-10-CM | POA: Insufficient documentation

## 2019-03-09 DIAGNOSIS — C50919 Malignant neoplasm of unspecified site of unspecified female breast: Secondary | ICD-10-CM | POA: Insufficient documentation

## 2019-03-09 DIAGNOSIS — K449 Diaphragmatic hernia without obstruction or gangrene: Secondary | ICD-10-CM | POA: Insufficient documentation

## 2019-03-09 DIAGNOSIS — F419 Anxiety disorder, unspecified: Secondary | ICD-10-CM | POA: Insufficient documentation

## 2019-03-09 DIAGNOSIS — Z888 Allergy status to other drugs, medicaments and biological substances status: Secondary | ICD-10-CM | POA: Diagnosis not present

## 2019-03-09 HISTORY — PX: COLONOSCOPY WITH PROPOFOL: SHX5780

## 2019-03-09 HISTORY — DX: Hidradenitis suppurativa: L73.2

## 2019-03-09 SURGERY — COLONOSCOPY WITH PROPOFOL
Anesthesia: General | Site: Rectum

## 2019-03-09 MED ORDER — STERILE WATER FOR IRRIGATION IR SOLN
Status: DC | PRN
  Administered 2019-03-09: 13:00:00 .05 mL

## 2019-03-09 MED ORDER — SODIUM CHLORIDE 0.9 % IV SOLN: INTRAVENOUS | Status: DC

## 2019-03-09 MED ORDER — LIDOCAINE HCL (CARDIAC) PF 100 MG/5ML IV SOSY
PREFILLED_SYRINGE | INTRAVENOUS | Status: DC | PRN
  Administered 2019-03-09: 50 mg via INTRAVENOUS

## 2019-03-09 MED ORDER — ACETAMINOPHEN 160 MG/5ML PO SOLN: 325.0000 mg | ORAL | Status: DC | PRN

## 2019-03-09 MED ORDER — ONDANSETRON HCL 4 MG/2ML IJ SOLN: 4.0000 mg | Freq: Once | INTRAMUSCULAR | Status: DC | PRN

## 2019-03-09 MED ORDER — LACTATED RINGERS IV SOLN: 10.0000 mL/h | INTRAVENOUS | Status: DC

## 2019-03-09 MED ORDER — ACETAMINOPHEN 325 MG PO TABS: 650.0000 mg | ORAL_TABLET | Freq: Once | ORAL | Status: DC | PRN

## 2019-03-09 MED ORDER — PROPOFOL 10 MG/ML IV BOLUS
INTRAVENOUS | Status: DC | PRN
  Administered 2019-03-09: 30 mg via INTRAVENOUS
  Administered 2019-03-09: 70 mg via INTRAVENOUS
  Administered 2019-03-09: 20 mg via INTRAVENOUS
  Administered 2019-03-09: 10 mg via INTRAVENOUS
  Administered 2019-03-09 (×2): 20 mg via INTRAVENOUS

## 2019-03-09 MED ORDER — LACTATED RINGERS IV SOLN
INTRAVENOUS | Status: DC | PRN
  Administered 2019-03-09: 13:00:00 via INTRAVENOUS

## 2019-03-09 SURGICAL SUPPLY — 5 items
CANISTER SUCT 1200ML W/VALVE (MISCELLANEOUS) ×3 IMPLANT
GOWN CVR UNV OPN BCK APRN NK (MISCELLANEOUS) ×2 IMPLANT
GOWN ISOL THUMB LOOP REG UNIV (MISCELLANEOUS) ×4
KIT ENDO PROCEDURE OLY (KITS) ×3 IMPLANT
WATER STERILE IRR 250ML POUR (IV SOLUTION) ×3 IMPLANT

## 2019-03-09 NOTE — Interval H&P Note (Signed)
History and Physical Interval Note:  03/09/2019 11:08 AM  Tiffany Mathews  has presented today for surgery, with the diagnosis of Family hx of colon polyps z83.71.  The various methods of treatment have been discussed with the patient and family. After consideration of risks, benefits and other options for treatment, the patient has consented to  Procedure(s) with comments: COLONOSCOPY WITH PROPOFOL (N/A) - sleep apnea as a surgical intervention.  The patient's history has been reviewed, patient examined, no change in status, stable for surgery.  I have reviewed the patient's chart and labs.  Questions were answered to the patient's satisfaction.     Randy Whitener [employer]

## 2019-03-09 NOTE — Op Note (Signed)
[facility] Gastroenterology Patient Name: Tiffany Mathews Procedure Date: 03/09/2019 12:25 PM MRN: [id number] Account #: [id number] Date of Birth: 1961-05-19 Admit Type: Outpatient Age: 58 Room: [facility] OR ROOM 01 Gender: Female Note Status: Finalized Procedure:            Colonoscopy Indications:          Family history of advanced adenoma of the colon in a                        first-degree relative before age 40 years Providers:            Lucilla Lame MD, MD Referring MD:         Cletis Athens, MD (Referring MD) Medicines:            Propofol per Anesthesia Complications:        No immediate complications. Procedure:            Pre-Anesthesia Assessment:                       - Prior to the procedure, a History and Physical was                        performed, and patient medications and allergies were                        reviewed. The patient's tolerance of previous                        anesthesia was also reviewed. The risks and benefits of                        the procedure and the sedation options and risks were                        discussed with the patient. All questions were                        answered, and informed consent was obtained. Prior                        Anticoagulants: The patient has taken no previous                        anticoagulant or antiplatelet agents. ASA Grade                        Assessment: II - A patient with mild systemic disease.                        After reviewing the risks and benefits, the patient was                        deemed in satisfactory condition to undergo the                        procedure.                       After obtaining informed consent, the colonoscope was  passed under direct vision. Throughout the procedure,                        the patient's blood pressure, pulse, and oxygen                        saturations were monitored continuously. The was                introduced through the anus and advanced to the the                        cecum, identified by appendiceal orifice and ileocecal                        valve. The colonoscopy was performed without                        difficulty. The patient tolerated the procedure well.                        The quality of the bowel preparation was excellent. Findings:      The perianal and digital rectal examinations were normal.      Non-bleeding internal hemorrhoids were found during retroflexion. The       hemorrhoids were Grade II (internal hemorrhoids that prolapse but reduce       spontaneously). Impression:           - Non-bleeding internal hemorrhoids.                       - No specimens collected. Recommendation:       - Discharge patient to home.                       - Resume previous diet.                       - Continue present medications.                       - Repeat colonoscopy in 5 years for surveillance. Procedure Code(s):    --- Professional ---                       [phone], Colonoscopy, flexible; diagnostic, including                        collection of specimen(s) by brushing or washing, when                        performed (separate procedure) Diagnosis Code(s):    --- Professional ---                       Z83.71, Family history of colonic polyps CPT copyright 2019 American Medical Association. All rights reserved. The codes documented in this report are preliminary and upon coder review may  be revised to meet current compliance requirements. Lucilla Lame MD, MD 03/09/2019 12:53:08 PM This report has been signed electronically. Number of Addenda: 0 Note Initiated On: 03/09/2019 12:25 PM Scope Withdrawal Time: 0 hours 7 minutes 18 seconds  Total Procedure Duration: 0 hours 9 minutes 53 seconds  Estimated Blood Loss: Estimated  blood loss: none.      [facility]

## 2019-03-09 NOTE — Anesthesia Procedure Notes (Signed)
Performed by: Matvey Llanas, CRNA Pre-anesthesia Checklist: Patient identified, Emergency Drugs available, Suction available, Timeout performed and Patient being monitored Patient Re-evaluated:Patient Re-evaluated prior to induction Oxygen Delivery Method: Nasal cannula Placement Confirmation: positive ETCO2       

## 2019-03-09 NOTE — Anesthesia Postprocedure Evaluation (Signed)
Anesthesia Post Note  Patient: Tiffany Mathews  Procedure(s) Performed: COLONOSCOPY WITH PROPOFOL (N/A Rectum)  Patient location during evaluation: PACU Anesthesia Type: General Level of consciousness: awake and alert, oriented and patient cooperative Pain management: pain level controlled Vital Signs Assessment: post-procedure vital signs reviewed and stable Respiratory status: spontaneous breathing, nonlabored ventilation and respiratory function stable Cardiovascular status: blood pressure returned to baseline and stable Postop Assessment: adequate PO intake Anesthetic complications: no    Darrin Nipper

## 2019-03-09 NOTE — Transfer of Care (Signed)
Immediate Anesthesia Transfer of Care Note  Patient: Tiffany Mathews  Procedure(s) Performed: COLONOSCOPY WITH PROPOFOL (N/A Rectum)  Patient Location: PACU  Anesthesia Type: General  Level of Consciousness: awake, alert  and patient cooperative  Airway and Oxygen Therapy: Patient Spontanous Breathing and Patient connected to supplemental oxygen  Post-op Assessment: Post-op Vital signs reviewed, Patient's Cardiovascular Status Stable, Respiratory Function Stable, Patent Airway and No signs of Nausea or vomiting  Post-op Vital Signs: Reviewed and stable  Complications: No apparent anesthesia complications

## 2019-03-20 ENCOUNTER — Ambulatory Visit: Payer: Medicare Other | Admitting: Gastroenterology

## 2019-03-28 ENCOUNTER — Encounter: Payer: Self-pay | Admitting: Radiation Oncology

## 2019-03-28 ENCOUNTER — Ambulatory Visit
Admission: RE | Admit: 2019-03-28 | Discharge: 2019-03-28 | Disposition: A | Discharge status: Home / Self Care | Payer: Medicare Other | Source: Ambulatory Visit | Attending: Radiation Oncology | Admitting: Radiation Oncology

## 2019-03-28 ENCOUNTER — Other Ambulatory Visit: Payer: Self-pay

## 2019-03-28 VITALS — BP 142/86 | HR 89 | Temp 98.7°F | Wt 170.6 lb

## 2019-03-28 DIAGNOSIS — Z79811 Long term (current) use of aromatase inhibitors: Secondary | ICD-10-CM | POA: Insufficient documentation

## 2019-03-28 DIAGNOSIS — C50412 Malignant neoplasm of upper-outer quadrant of left female breast: Secondary | ICD-10-CM | POA: Insufficient documentation

## 2019-03-28 DIAGNOSIS — Z923 Personal history of irradiation: Secondary | ICD-10-CM | POA: Insufficient documentation

## 2019-03-28 DIAGNOSIS — Z170 Estrogen receptor positive status [ER+]: Secondary | ICD-10-CM | POA: Diagnosis not present

## 2019-03-28 NOTE — Progress Notes (Signed)
Radiation Oncology Follow up Note  Name: Tiffany Mathews   Date:   03/28/2019 MRN:  [id number] DOB: 1960/08/30    This 58 y.o. female presents to the clinic today for 32-month follow-up status post whole breast radiation to her left breast for stage I ER/PR positive invasive mammary carcinoma.  REFERRING PROVIDER: Cletis Athens, MD  HPI: Patient is a 58 year old female now seen at 6 months having completed whole breast radiation to her left breast for stage I ER/PR positive invasive mammary carcinoma.  Seen today in routine follow-up she has been having some shooting pains in her left upper extremity may be a cervical disc problem she has a history of back issues.  She specifically denies breast tenderness cough or bone pain.  She is currently on.  Anastrozole tolerating that well without side effect.  She is scheduled for mammograms later this month which I will review when available.  COMPLICATIONS OF TREATMENT: none  FOLLOW UP COMPLIANCE: keeps appointments   PHYSICAL EXAM:  BP (!) 142/86   Pulse 89   Temp 98.7 F (37.1 C)   Wt 170 lb 9.6 oz (77.4 kg)   BMI 28.39 kg/m  Lungs are clear to A&P cardiac examination essentially unremarkable with regular rate and rhythm. No dominant mass or nodularity is noted in either breast in 2 positions examined. Incision is well-healed. No axillary or supraclavicular adenopathy is appreciated. Cosmetic result is excellent.  Well-developed well-nourished patient in NAD. HEENT reveals PERLA, EOMI, discs not visualized.  Oral cavity is clear. No oral mucosal lesions are identified. Neck is clear without evidence of cervical or supraclavicular adenopathy. Lungs are clear to A&P. Cardiac examination is essentially unremarkable with regular rate and rhythm without murmur rub or thrill. Abdomen is benign with no organomegaly or masses noted. Motor sensory and DTR levels are equal and symmetric in the upper and lower extremities. Cranial nerves II through XII are  grossly intact. Proprioception is intact. No peripheral adenopathy or edema is identified. No motor or sensory levels are noted. Crude visual fields are within normal range.  RADIOLOGY RESULTS: Mammograms are reviewed next week when they are available  PLAN: Present time patient is doing well 6 months out from whole breast radiation I have assured her her arm pain is most likely a back issue and not related to radiation.  She continues on anastrozole without side effect.  I have asked to see her back in 6 months for follow-up.  Patient knows to call sooner with any concerns.  I would like to take this opportunity to thank you for allowing me to participate in the care of your patient.Noreene Filbert, MD

## 2019-03-29 ENCOUNTER — Telehealth: Payer: Self-pay | Admitting: Oncology

## 2019-03-29 NOTE — Telephone Encounter (Signed)
LM to try and R/S NS appt-ltg °

## 2019-03-30 ENCOUNTER — Ambulatory Visit
Admission: RE | Admit: 2019-03-30 | Discharge: 2019-03-30 | Disposition: A | Discharge status: Home / Self Care | Payer: Medicare Other | Source: Ambulatory Visit | Attending: General Surgery | Admitting: General Surgery

## 2019-03-30 DIAGNOSIS — Z853 Personal history of malignant neoplasm of breast: Secondary | ICD-10-CM | POA: Diagnosis present

## 2019-04-20 ENCOUNTER — Other Ambulatory Visit: Payer: Self-pay

## 2019-04-20 ENCOUNTER — Other Ambulatory Visit: Payer: Self-pay | Admitting: Gastroenterology

## 2019-04-20 MED ORDER — PANTOPRAZOLE SODIUM 40 MG PO TBEC: 40.0000 mg | DELAYED_RELEASE_TABLET | Freq: Every day | ORAL | 1 refills | Status: DC

## 2019-06-24 ENCOUNTER — Other Ambulatory Visit: Payer: Self-pay | Admitting: Oncology

## 2019-06-24 DIAGNOSIS — Z08 Encounter for follow-up examination after completed treatment for malignant neoplasm: Secondary | ICD-10-CM

## 2019-06-25 ENCOUNTER — Other Ambulatory Visit: Payer: Self-pay | Admitting: Oncology

## 2019-06-25 DIAGNOSIS — Z08 Encounter for follow-up examination after completed treatment for malignant neoplasm: Secondary | ICD-10-CM

## 2019-06-25 MED ORDER — ANASTROZOLE 1 MG PO TABS: 1.0000 mg | ORAL_TABLET | Freq: Every day | ORAL | 3 refills | Status: DC

## 2019-08-29 ENCOUNTER — Telehealth: Payer: Self-pay | Admitting: Oncology

## 2019-08-29 NOTE — Telephone Encounter (Signed)
Pt left a VM that you called her and she is requesting a call back.

## 2019-09-03 ENCOUNTER — Encounter: Payer: Self-pay | Admitting: Oncology

## 2019-09-03 ENCOUNTER — Inpatient Hospital Stay: Payer: Medicare Other | Attending: Oncology | Admitting: Oncology

## 2019-09-03 DIAGNOSIS — Z79811 Long term (current) use of aromatase inhibitors: Secondary | ICD-10-CM | POA: Diagnosis not present

## 2019-09-03 DIAGNOSIS — Z853 Personal history of malignant neoplasm of breast: Secondary | ICD-10-CM | POA: Diagnosis not present

## 2019-09-03 DIAGNOSIS — Z08 Encounter for follow-up examination after completed treatment for malignant neoplasm: Secondary | ICD-10-CM | POA: Diagnosis not present

## 2019-09-03 DIAGNOSIS — Z5181 Encounter for therapeutic drug level monitoring: Secondary | ICD-10-CM

## 2019-09-03 NOTE — Progress Notes (Signed)
Patient stated that she would like to know when she could come in and have her labs drawn. Patient also wanted to mention that she will be having surgery on her esophagus because they found a cyst and wanted it removed. Patient's last mammogram was on 03/30/2019.

## 2019-09-05 NOTE — Progress Notes (Signed)
I connected with Tiffany Mathews on 09/05/19 at 10:15 AM EDT by video enabled telemedicine visit and verified that I am speaking with the correct person using two identifiers.   I discussed the limitations, risks, security and privacy concerns of performing an evaluation and management service by telemedicine and the availability of in-person appointments. I also discussed with the patient that there may be a patient responsible charge related to this service. The patient expressed understanding and agreed to proceed.  Other persons participating in the visit and their role in the encounter:  none  Patient's location:  home Provider's location:  work  Risk analyst Complaint:  Routine f/u of breast cancer on arimidex  History of present illness: patient is a 59 year old Hispanic female who underwent bilateral screening mammogram in September 2019 which showed a suspicious mass in the left breast 2 o'clock position measuring 0.8 x 0.5 x 0.8 cm in size. Approximately 1.2 cm lateral to this mass were 2-3 tightly clustered hypoechoic nodules together measuring 29 x 0.2 x 0.8 cm. There was also an other hypoechoic area which was suggestive of fibrocystic tissue. Several other tiny hypoechoic masses were noted with prominent ducts which could represent fibrocystic change. DCIS is also a concern. Ultrasound of the left axilla did not reveal any pathologic adenopathy. Patient underwent core biopsy of the dominant mass as well as the cluster of nodules immediately adjacent to it. 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 Biopsy of the second breast mass was negative for atypia and malignancy.  MRI of bilateral breasts on 04/11/2018 showed 0.8 cm biopsy-proven malignancy in the upper outer quadrant of the left breast. Second mass in the upper outer quadrant of the left breast which is known and pathology to be benign  and enhances to a lesser degree than does the biopsy malignancy.  Patient underwent lumpectomy and sentinel lymph node biopsy on 04/19/2018. Final pathology showed invasive mammary carcinoma, grade 1, 10 mm. 2 sentinel lymph nodes were negative for malignancy. Negative margins. ER PR greater than 90% positive. HER-2/neu negative. pT1b pN0  Patient did undergo genetic testing which did not reveal any mutation.   Interval history: She is tolerating Arimidex well, moving significant side effects.  He has occasional pain at the surgical site but denies other complaints   Review of Systems  Constitutional: Negative for chills, fever, malaise/fatigue and weight loss.  HENT: Negative for congestion, ear discharge and nosebleeds.   Eyes: Negative for blurred vision.  Respiratory: Negative for cough, hemoptysis, sputum production, shortness of breath and wheezing.   Cardiovascular: Negative for chest pain, palpitations, orthopnea and claudication.  Gastrointestinal: Negative for abdominal pain, blood in stool, constipation, diarrhea, heartburn, melena, nausea and vomiting.  Genitourinary: Negative for dysuria, flank pain, frequency, hematuria and urgency.  Musculoskeletal: Negative for back pain, joint pain and myalgias.  Skin: Negative for rash.  Neurological: Negative for dizziness, tingling, focal weakness, seizures, weakness and headaches.  Endo/Heme/Allergies: Does not bruise/bleed easily.  Psychiatric/Behavioral: Negative for depression and suicidal ideas. The patient does not have insomnia.     Allergies  Allergen Reactions  . Lamictal [Lamotrigine] Rash and Other (See Comments)  . Depakote [Divalproex Sodium] Other (See Comments)    Severe hair loss  . Lithium Other (See Comments)    Unknown; "It just made me feel bad."    Past Medical History:  Diagnosis Date  . Anxiety   . Arthritis   .  Bipolar 1 disorder ([place])   . Breast cancer ([place])   . Depression   . Fibromyalgia    . GERD (gastroesophageal reflux disease)   . History of hiatal hernia   . Hydradenitis   . Hyperlipidemia   . Hypertension   . Multiple thyroid nodules    benign  . Sleep apnea    has CPAP, doesn't use  . Wears contact lenses     Past Surgical History:  Procedure Laterality Date  . ABLATION  04/2011  . BREAST BIOPSY Right 2009   INVASIVE MAMMARY CARCINOMA, GRADE 1  . BREAST LUMPECTOMY Left 04/19/2018   INVASIVE MAMMARY CARCINOMA, GRADE 1  . CESAREAN SECTION     x3  . CHOLECYSTECTOMY    . COLONOSCOPY WITH PROPOFOL N/A 03/09/2019   Procedure: COLONOSCOPY WITH PROPOFOL;  Surgeon: Lucilla Lame, MD;  Location: [place];  Service: Endoscopy;  Laterality: N/A;  sleep apnea  . GALLBLADDER SURGERY    . LYSIS OF ADHESION  05/27/2016   Procedure: LYSIS OF ADHESION;  Surgeon: Clayburn Pert, MD;  Location: [facility];  Service: General;;  . PARTIAL MASTECTOMY WITH NEEDLE LOCALIZATION Left 04/19/2018   Procedure: PARTIAL MASTECTOMY WITH NEEDLE LOCALIZATION;  Surgeon: Herbert Pun, MD;  Location: [facility];  Service: General;  Laterality: Left;  . ROBOTIC ASSISTED LAPAROSCOPIC CHOLECYSTECTOMY N/A 05/27/2016   Procedure: ROBOTIC ASSISTED LAPAROSCOPIC CHOLECYSTECTOMY;  Surgeon: Clayburn Pert, MD;  Location: [facility];  Service: General;  Laterality: N/A;  . SENTINEL NODE BIOPSY Left 04/19/2018   Procedure: SENTINEL NODE BIOPSY;  Surgeon: Herbert Pun, MD;  Location: [facility];  Service: General;  Laterality: Left;  . SHOULDER ARTHROSCOPY  2000  . TUBAL LIGATION      Social History   Socioeconomic History  . Marital status: Divorced    Spouse name: Not on file  . Number of children: Not on file  . Years of education: Not on file  . Highest education level: Not on file  Occupational History  . Not on file  Tobacco Use  . Smoking status: Current Every Day Smoker    Packs/day: 1.00    Years: 20.00    Pack years: 20.00    Types: Cigarettes  . Smokeless  tobacco: Never Used  Substance and Sexual Activity  . Alcohol use: No  . Drug use: No  . Sexual activity: Not Currently  Other Topics Concern  . Not on file  Social History Narrative  . Not on file   Social Determinants of Health   Financial Resource Strain:   . Difficulty of Paying Living Expenses:   Food Insecurity:   . Worried About Charity fundraiser in the Last Year:   . Arboriculturist in the Last Year:   Transportation Needs:   . Film/video editor (Medical):   Marland Kitchen Lack of Transportation (Non-Medical):   Physical Activity:   . Days of Exercise per Week:   . Minutes of Exercise per Session:   Stress:   . Feeling of Stress :   Social Connections:   . Frequency of Communication with Friends and Family:   . Frequency of Social Gatherings with Friends and Family:   . Attends Religious Services:   . Active Member of Clubs or Organizations:   . Attends Archivist Meetings:   Marland Kitchen Marital Status:   Intimate Partner Violence:   . Fear of Current or Ex-Partner:   . Emotionally Abused:   Marland Kitchen Physically Abused:   . Sexually Abused:  Family History  Problem Relation Age of Onset  . Thyroid cancer Mother        dx 31s; currently 67  . Hypertension Mother   . Diabetes Mother   . Breast cancer Maternal Aunt 63       deceased 29s  . Alzheimer's disease Father        deceased 39  . Stroke Father   . Drug abuse Sister        deceased 44  . Diabetes Brother   . Hypertension Brother   . Heart attack Maternal Grandfather   . Diabetes Brother   . Hypertension Brother   . Alcohol abuse Brother   . Schizophrenia Brother   . Stomach cancer Paternal Aunt        age at dx unknown  . Stomach cancer Paternal Uncle        age at dx unknown  . Cancer Other        daughter of sister who died at 46; breast ca in 22s and ovarian cancer at 62; currently 83s     Current Outpatient Medications:  .  anastrozole (ARIMIDEX) 1 MG tablet, Take 1 tablet (1 mg total) by  mouth daily., Disp: 90 tablet, Rfl: 3 .  aspirin EC 81 MG tablet, Take 81 mg by mouth daily. , Disp: , Rfl:  .  atorvastatin (LIPITOR) 40 MG tablet, Take 40 mg by mouth daily. , Disp: , Rfl:  .  clonazePAM (KLONOPIN) 0.5 MG tablet, Take 0.5 mg by mouth 2 (two) times daily. , Disp: , Rfl:  .  cyclobenzaprine (FLEXERIL) 10 MG tablet, Take 10 mg by mouth at bedtime as needed for muscle spasms. , Disp: , Rfl:  .  gabapentin (NEURONTIN) 300 MG capsule, Take 300 mg by mouth 3 (three) times daily. , Disp: , Rfl: 1 .  loratadine (CLARITIN) 10 MG tablet, Take 10 mg by mouth daily as needed for allergies., Disp: , Rfl: 3 .  losartan (COZAAR) 100 MG tablet, Take 100 mg by mouth 2 (two) times daily. , Disp: , Rfl:  .  Multiple Vitamins-Minerals (MULTIVITAMIN WITH MINERALS) tablet, Take 1 tablet by mouth daily., Disp: , Rfl:  .  pantoprazole (PROTONIX) 40 MG tablet, Take 1 tablet (40 mg total) by mouth daily., Disp: 90 tablet, Rfl: 1 .  traZODone (DESYREL) 100 MG tablet, Take 100 mg by mouth at bedtime as needed (sleep). , Disp: , Rfl: 1 .  triamcinolone cream (KENALOG) 0.1 %, Apply 1 application topically 2 (two) times daily as needed (for foot rash). , Disp: , Rfl:  .  venlafaxine XR (EFFEXOR-XR) 75 MG 24 hr capsule, Take 1 capsule by mouth daily., Disp: , Rfl:  .  ziprasidone (GEODON) 40 MG capsule, Take 40 mg by mouth every evening. , Disp: , Rfl:  .  minocycline (MINOCIN) 100 MG capsule, Take 100 mg by mouth 2 (two) times daily., Disp: , Rfl:   No results found.  No images are attached to the encounter.   CMP Latest Ref Rng & Units 11/21/2018  Glucose 70 - 99 mg/dL 116(H)  BUN 6 - 20 mg/dL 16  Creatinine 0.44 - 1.00 mg/dL 0.79  Sodium 135 - 145 mmol/L 141  Potassium 3.5 - 5.1 mmol/L 3.7  Chloride 98 - 111 mmol/L 106  CO2 22 - 32 mmol/L 25  Calcium 8.9 - 10.3 mg/dL 9.1  Total Protein 6.5 - 8.1 g/dL 7.4  Total Bilirubin 0.3 - 1.2 mg/dL 0.6  Alkaline Phos 38 -  126 U/L 54  AST 15 - 41 U/L 16   ALT 0 - 44 U/L 16   CBC Latest Ref Rng & Units 08/03/2018  WBC 4.0 - 10.5 K/uL 8.6  Hemoglobin 12.0 - 15.0 g/dL 14.0  Hematocrit 36.0 - 46.0 % 39.9  Platelets 150 - 400 K/uL 362     Observation/objective: Appears in no acute distress over video visit today.  Breathing is nonlabored  Assessment and plan: Patient is a 59 year old female with invasive mammary carcinoma of the left breast stage I ER/PR positive HER-2/neu negative s/p lumpectomy and adjuvant radiation treatment.  She is currently on Arimidex and this is a routine follow-up visit  Patient continues to do well on Arimidex and reports no significant side effects.  She is taking calcium and vitamin D as well.  Recent mammogram from October 2020 did not reveal any evidence of malignancy.  Also a bone density scan at that time was normal.  Follow-up instructions: I will see her back in 6 months for breast exam.  We will also schedule her mammogram in October  I discussed the assessment and treatment plan with the patient. The patient was provided an opportunity to ask questions and all were answered. The patient agreed with the plan and demonstrated an understanding of the instructions.   The patient was advised to call back or seek an in-person evaluation if the symptoms worsen or if the condition fails to improve as anticipated.   Visit Diagnosis: 1. Encounter for follow-up surveillance of breast cancer   2. Visit for monitoring Arimidex therapy     Dr. Randa Evens, MD, MPH [facility] Tel- [phone] 09/05/2019 11:42 AM

## 2019-09-13 ENCOUNTER — Encounter: Payer: Self-pay | Admitting: Otolaryngology

## 2019-09-13 ENCOUNTER — Other Ambulatory Visit: Payer: Self-pay

## 2019-09-17 ENCOUNTER — Other Ambulatory Visit: Payer: Self-pay

## 2019-09-17 ENCOUNTER — Other Ambulatory Visit
Admission: RE | Admit: 2019-09-17 | Discharge: 2019-09-17 | Disposition: A | Discharge status: Home / Self Care | Payer: Medicare Other | Source: Ambulatory Visit | Attending: Otolaryngology | Admitting: Otolaryngology

## 2019-09-17 DIAGNOSIS — Z20822 Contact with and (suspected) exposure to covid-19: Secondary | ICD-10-CM | POA: Insufficient documentation

## 2019-09-17 DIAGNOSIS — Z01812 Encounter for preprocedural laboratory examination: Secondary | ICD-10-CM | POA: Insufficient documentation

## 2019-09-17 LAB — SARS CORONAVIRUS 2 (TAT 6-24 HRS): SARS Coronavirus 2: NEGATIVE

## 2019-09-19 ENCOUNTER — Encounter: Payer: Self-pay | Admitting: Otolaryngology

## 2019-09-19 ENCOUNTER — Ambulatory Visit: Payer: Medicare Other | Admitting: Anesthesiology

## 2019-09-19 ENCOUNTER — Ambulatory Visit
Admission: RE | Admit: 2019-09-19 | Discharge: 2019-09-19 | Disposition: A | Discharge status: Home / Self Care | Payer: Medicare Other | Attending: Otolaryngology | Admitting: Otolaryngology

## 2019-09-19 ENCOUNTER — Encounter
Admission: RE | Disposition: A | Discharge status: Home / Self Care | Payer: Self-pay | Source: Home / Self Care | Attending: Otolaryngology

## 2019-09-19 DIAGNOSIS — F17200 Nicotine dependence, unspecified, uncomplicated: Secondary | ICD-10-CM | POA: Diagnosis not present

## 2019-09-19 DIAGNOSIS — J36 Peritonsillar abscess: Secondary | ICD-10-CM | POA: Diagnosis not present

## 2019-09-19 DIAGNOSIS — I10 Essential (primary) hypertension: Secondary | ICD-10-CM | POA: Insufficient documentation

## 2019-09-19 DIAGNOSIS — K219 Gastro-esophageal reflux disease without esophagitis: Secondary | ICD-10-CM | POA: Diagnosis not present

## 2019-09-19 DIAGNOSIS — E7800 Pure hypercholesterolemia, unspecified: Secondary | ICD-10-CM | POA: Diagnosis not present

## 2019-09-19 DIAGNOSIS — G4730 Sleep apnea, unspecified: Secondary | ICD-10-CM | POA: Diagnosis not present

## 2019-09-19 DIAGNOSIS — R599 Enlarged lymph nodes, unspecified: Secondary | ICD-10-CM | POA: Diagnosis not present

## 2019-09-19 HISTORY — PX: TONSILLECTOMY: SHX5217

## 2019-09-19 SURGERY — TONSILLECTOMY
Anesthesia: General | Site: Mouth | Laterality: Right

## 2019-09-19 MED ORDER — OXYCODONE HCL 5 MG PO TABS: 5.0000 mg | ORAL_TABLET | Freq: Once | ORAL | Status: AC | PRN

## 2019-09-19 MED ORDER — GLYCOPYRROLATE 0.2 MG/ML IJ SOLN
INTRAMUSCULAR | Status: DC | PRN
  Administered 2019-09-19: .1 mg via INTRAVENOUS

## 2019-09-19 MED ORDER — MIDAZOLAM HCL 5 MG/5ML IJ SOLN
INTRAMUSCULAR | Status: DC | PRN
  Administered 2019-09-19: 1 mg via INTRAVENOUS

## 2019-09-19 MED ORDER — DEXAMETHASONE SODIUM PHOSPHATE 4 MG/ML IJ SOLN
INTRAMUSCULAR | Status: DC | PRN
  Administered 2019-09-19: 10 mg via INTRAVENOUS

## 2019-09-19 MED ORDER — OXYCODONE HCL 5 MG/5ML PO SOLN: 7.5000 mg | Freq: Four times a day (QID) | ORAL | 0 refills | Status: DC | PRN

## 2019-09-19 MED ORDER — LIDOCAINE HCL (CARDIAC) PF 100 MG/5ML IV SOSY
PREFILLED_SYRINGE | INTRAVENOUS | Status: DC | PRN
  Administered 2019-09-19: 20 mg via INTRAVENOUS

## 2019-09-19 MED ORDER — ONDANSETRON HCL 4 MG/2ML IJ SOLN: 4.0000 mg | Freq: Once | INTRAMUSCULAR | Status: DC | PRN

## 2019-09-19 MED ORDER — LACTATED RINGERS IV SOLN
10.0000 mL/h | INTRAVENOUS | Status: DC
  Administered 2019-09-19: 10 mL/h via INTRAVENOUS

## 2019-09-19 MED ORDER — PROPOFOL 10 MG/ML IV BOLUS
INTRAVENOUS | Status: DC | PRN
  Administered 2019-09-19: 50 mg via INTRAVENOUS
  Administered 2019-09-19: 160 mg via INTRAVENOUS
  Administered 2019-09-19: 40 mg via INTRAVENOUS

## 2019-09-19 MED ORDER — LACTATED RINGERS IV SOLN: 100.0000 mL/h | INTRAVENOUS | Status: DC

## 2019-09-19 MED ORDER — FENTANYL CITRATE (PF) 100 MCG/2ML IJ SOLN
INTRAMUSCULAR | Status: DC | PRN
  Administered 2019-09-19: 50 ug via INTRAVENOUS
  Administered 2019-09-19 (×2): 25 ug via INTRAVENOUS

## 2019-09-19 MED ORDER — OXYCODONE HCL 5 MG/5ML PO SOLN
5.0000 mg | Freq: Once | ORAL | Status: AC | PRN
  Administered 2019-09-19: 5 mg via ORAL

## 2019-09-19 MED ORDER — ONDANSETRON HCL 4 MG/2ML IJ SOLN
INTRAMUSCULAR | Status: DC | PRN
  Administered 2019-09-19: 4 mg via INTRAVENOUS

## 2019-09-19 MED ORDER — SUCCINYLCHOLINE CHLORIDE 20 MG/ML IJ SOLN
INTRAMUSCULAR | Status: DC | PRN
  Administered 2019-09-19: 100 mg via INTRAVENOUS

## 2019-09-19 MED ORDER — FENTANYL CITRATE (PF) 100 MCG/2ML IJ SOLN
25.0000 ug | INTRAMUSCULAR | Status: DC | PRN
  Administered 2019-09-19: 50 ug via INTRAVENOUS

## 2019-09-19 MED ORDER — ACETAMINOPHEN 10 MG/ML IV SOLN
1000.0000 mg | Freq: Once | INTRAVENOUS | Status: AC
  Administered 2019-09-19: 1000 mg via INTRAVENOUS

## 2019-09-19 MED ORDER — ONDANSETRON HCL 4 MG PO TABS: 4.0000 mg | ORAL_TABLET | Freq: Three times a day (TID) | ORAL | 0 refills | Status: DC | PRN

## 2019-09-19 MED ORDER — SCOPOLAMINE 1 MG/3DAYS TD PT72
1.0000 | MEDICATED_PATCH | Freq: Once | TRANSDERMAL | Status: DC
  Administered 2019-09-19: 1.5 mg via TRANSDERMAL

## 2019-09-19 SURGICAL SUPPLY — 19 items
BLADE BOVIE TIP EXT 4 (BLADE) ×3 IMPLANT
CANISTER SUCT 1200ML W/VALVE (MISCELLANEOUS) ×3 IMPLANT
CATH ROBINSON RED A/P 10FR (CATHETERS) ×3 IMPLANT
COAG SUCT 10F 3.5MM HAND CTRL (MISCELLANEOUS) ×3 IMPLANT
ELECT REM PT RETURN 9FT ADLT (ELECTROSURGICAL) ×3
ELECTRODE REM PT RTRN 9FT ADLT (ELECTROSURGICAL) ×1 IMPLANT
GLOVE BIO SURGEON STRL SZ7.5 (GLOVE) ×3 IMPLANT
HANDLE SUCTION POOLE (INSTRUMENTS) ×1 IMPLANT
KIT TURNOVER KIT A (KITS) ×3 IMPLANT
NEEDLE HYPO 25GX1X1/2 BEV (NEEDLE) ×3 IMPLANT
NS IRRIG 500ML POUR BTL (IV SOLUTION) ×3 IMPLANT
PACK TONSIL AND ADENOID CUSTOM (PACKS) ×3 IMPLANT
PENCIL SMOKE EVACUATOR (MISCELLANEOUS) ×3 IMPLANT
SLEEVE SUCTION 125 (MISCELLANEOUS) ×3 IMPLANT
SOL ANTI-FOG 6CC FOG-OUT (MISCELLANEOUS) ×1 IMPLANT
SOL FOG-OUT ANTI-FOG 6CC (MISCELLANEOUS) ×2
STRAP BODY AND KNEE 60X3 (MISCELLANEOUS) ×3 IMPLANT
SUCTION POOLE HANDLE (INSTRUMENTS) ×3
SYR 5ML LL (SYRINGE) ×3 IMPLANT

## 2019-09-19 NOTE — Anesthesia Postprocedure Evaluation (Signed)
Anesthesia Post Note  Patient: Tiffany Mathews  Procedure(s) Performed: PARTIAL VS TOTAL TONSILLECTOMY (Right Mouth)     Patient location during evaluation: PACU Anesthesia Type: General Level of consciousness: awake and alert and oriented Pain management: satisfactory to patient Vital Signs Assessment: post-procedure vital signs reviewed and stable Respiratory status: spontaneous breathing, nonlabored ventilation and respiratory function stable Cardiovascular status: blood pressure returned to baseline and stable Postop Assessment: Adequate PO intake and No signs of nausea or vomiting Anesthetic complications: no    Raliegh Ip

## 2019-09-19 NOTE — Transfer of Care (Signed)
Immediate Anesthesia Transfer of Care Note  Patient: Tiffany Mathews  Procedure(s) Performed: PARTIAL VS TOTAL TONSILLECTOMY (Right Mouth)  Patient Location: PACU  Anesthesia Type: General ETT  Level of Consciousness: awake, alert  and patient cooperative  Airway and Oxygen Therapy: Patient Spontanous Breathing and Patient connected to supplemental oxygen  Post-op Assessment: Post-op Vital signs reviewed, Patient's Cardiovascular Status Stable, Respiratory Function Stable, Patent Airway and No signs of Nausea or vomiting  Post-op Vital Signs: Reviewed and stable  Complications: No apparent anesthesia complications

## 2019-09-19 NOTE — Discharge Instructions (Signed)
T & A INSTRUCTION SHEET - [facility]  Carloyn Manner, MD  [address] [place], [place] [address] TEL.  [phone]  INFORMATION SHEET FOR A TONSILLECTOMY AND ADENDOIDECTOMY  About Your Tonsils and Adenoids  The tonsils and adenoids are normal body tissues that are part of our immune system.  They normally help to protect [place] against diseases that may enter our mouth and nose. However, sometimes the tonsils and/or adenoids become too large and obstruct our breathing, especially at night.    If either of these things happen it helps to remove the tonsils and adenoids in order to become healthier. The operation to remove the tonsils and adenoids is called a tonsillectomy and adenoidectomy.  The Location of Your Tonsils and Adenoids  The tonsils are located in the back of the throat on both side and sit in a cradle of muscles. The adenoids are located in the roof of the mouth, behind the nose, and closely associated with the opening of the Eustachian tube to the ear.  Surgery on Tonsils and Adenoids  A tonsillectomy and adenoidectomy is a short operation which takes about thirty minutes.  This includes being put to sleep and being awakened. Tonsillectomies and adenoidectomies are performed at [facility] and may require observation period in the recovery room prior to going home. Children are required to remain in recovery for at least 45 minutes.   Following the Operation for a Tonsillectomy  A cautery machine is used to control bleeding. Bleeding from a tonsillectomy and adenoidectomy is minimal and postoperatively the risk of bleeding is approximately four percent, although this rarely life threatening.  After your tonsillectomy and adenoidectomy post-op care at home: 1. Our patients are able to go home the same day. You may be given prescriptions for pain medications, if indicated. 2. It is extremely important to  remember that fluid intake is of utmost importance after a tonsillectomy. The amount that you drink must be maintained in the postoperative period. A good indication of whether a child is getting enough fluid is whether his/her urine output is constant. As long as children are urinating or wetting their diaper every 6 - 8 hours this is usually enough fluid intake.   3. Although rare, this is a risk of some bleeding in the first ten days after surgery. This usually occurs between day five and nine postoperatively. This risk of bleeding is approximately four percent. If you or your child should have any bleeding you should remain calm and notify our office or go directly to the emergency room at [facility] where they will contact us. Our doctors are available seven days a week for notification. We recommend sitting up quietly in a chair, place an ice pack on the front of the neck and spitting out the blood gently until we are able to contact you. Adults should gargle gently with ice water and this may help stop the bleeding. If the bleeding does not stop after a short time, i.e. 10 to 15 minutes, or seems to be increasing again, please contact us or go to the hospital.   4. It is common for the pain to be worse at 5 - 7 days postoperatively. This occurs because the scab is peeling off and the mucous membrane (skin of the throat) is growing back where the tonsils were.   5. It is common for a low-grade fever, less than 102, during the first week  after a tonsillectomy and adenoidectomy. It is usually due to not drinking enough liquids, and we suggest your use liquid Tylenol (acetaminophen) or the pain medicine with Tylenol (acetaminophen) prescribed in order to keep your temperature below 102. Please follow the directions on the back of the bottle. 6. Recommendations for post-operative pain in children and adults: a) For Children 12 and younger: Recommendations are for oral Tylenol  (acetaminophen) and oral Motrin (Ibuprofen) along with a prescription dose of Prednisolone which is a steroid to help with pain and swelling. Administer the Tylenol (acetaminophen) and Motrin as stated on bottle for patient's age/weight. Sometimes it may be necessary to alternate the Tylenol (acetaminophen) and Motrin for improved pain control. Motrin does last slightly longer so many patients benefit from being given this prior to bedtime. All children should avoid Aspirin products for 2 weeks following surgery. b) For children over the age of 16: Tylenol (acetaminophen) is the preferred first choice for pain control. Depending on your child's size, sometimes they will be given a combination of Tylenol (acetaminophen) and hydrocodone medication or sometimes it will be recommended they take Motrin (ibuprofen) in addition to the Tylenol (acetaminophen). Narcotics should always be used with caution in children following surgery as they can suppress their breathing and switching to over the counter Tylenol (acetaminophen) and Motrin (ibuprofen) as soon as possible is recommended. All patients should avoid Aspirin products for 2 weeks following surgery. c) Adults: Usually adults will require a narcotic pain medication following a tonsillectomy. This usually has either hydrocodone or oxycodone in it and can usually be taken every 4 to 6 hours as needed for moderate pain. If the medication does not have Tylenol (acetaminophen) in it, you may also supplement Tylenol (acetaminophen) as needed every 4 to 6 hours for breakthrough or mild pain. Adults are also given Viscous Lidocaine to swish and spit every 6 hours to help with topical pain. Adults should avoid Aspirin, Aleve, Motrin, and Ibuprofen products for 2 weeks following surgery as they can increase your risk of bleeding. 7. If you happen to look in the mirror or into your child's mouth you will see white/gray patches on the back of the throat. This is what a scab  looks like in the mouth and is normal after having a tonsillectomy and adenoidectomy. They will disappear once the tonsil areas heal completely. However, it may cause a noticeable odor, and this too will disappear with time.     8. You or your child may experience ear pain after having a tonsillectomy and adenoidectomy.  This is called referred pain and comes from the throat, but it is felt in the ears.  Ear pain is quite common and expected. It will usually go away after ten days. There is usually nothing wrong with the ears, and it is primarily due to the healing area stimulating the nerve to the ear that runs along the side of the throat. Use either the prescribed pain medicine or Tylenol (acetaminophen) as needed.  9. The throat tissues after a tonsillectomy are obviously sensitive. Smoking around children who have had a tonsillectomy significantly increases the risk of bleeding. DO NOT SMOKE!  What to Expect Each Day  First Day at Home 1. Patients will be discharged home the same day.  2. Drink at least four glasses of liquid a day. Clear, cool liquids are recommended. Fruit juices containing citric acid are not recommended because they tend to cause pain. Carbonated beverages are allowed if you pour them from glass  to glass to remove the bubbles as these tend to cause discomfort. Avoid alcoholic beverages.  3. Eat very soft foods such as soups, broth, jello, custard, pudding, ice cream, popsicles, applesauce, mashed potatoes, and in general anything that you can crush between your tongue and the roof of your mouth. Try adding [employer] Mix into your food for extra calories. It is not uncommon to lose 5 to 10 pounds of fluid weight. The weight will be gained back quickly once you're feeling better and drinking more.  4. Sleep with your head elevated on two pillows for about three days to help decrease the swelling.  5. DO NOT SMOKE!  Day Two  1. Rest as much as possible. Use common  sense in your activities.  2. Continue drinking at least four glasses of liquid per day.  3. Follow the soft diet.  4. Use your pain medication as needed.  Day Three  1. Advance your activity as you are able and continue to follow the previous day's suggestions.  Days Four Through Six  1. Advance your diet and begin to eat more solid foods such as chopped hamburger. 2. Advance your activities slowly. Children should be kept mostly around the house.  3. Not uncommonly, there will be more pain at this time. It is temporary, usually lasting a day or two.  Day Seven Through Ten  1. Most individuals by this time are able to return to work or school unless otherwise instructed. Consider sending children back to school for a half day on the first day back.  Scopolamine skin patches REMOVE PATCH IN 72 HOURS AND [place] HANDS IMMEDIATELY  What is this medicine? SCOPOLAMINE (skoe POL a meen) is used to prevent nausea and vomiting caused by motion sickness, anesthesia and surgery. This medicine may be used for other purposes; ask your health care provider or pharmacist if you have questions. COMMON BRAND NAME(S): Transderm Scop What should I tell my health care provider before I take this medicine? They need to know if you have any of these conditions:  are scheduled to have a gastric secretion test  glaucoma  heart disease  kidney disease  liver disease  lung or breathing disease, like asthma  mental illness  prostate disease  seizures  stomach or intestine problems  trouble passing urine  an unusual or allergic reaction to scopolamine, atropine, other medicines, foods, dyes, or preservatives  pregnant or trying to get pregnant  breast-feeding How should I use this medicine? This medicine is for external use only. Follow the directions on the prescription label. Wear only 1 patch at a time. Choose an area behind the ear, that is clean, dry, hairless and free from any cuts or  irritation. Wipe the area with a clean dry tissue. Peel off the plastic backing of the skin patch, trying not to touch the adhesive side with your hands. Do not cut the patches. Firmly apply to the area you have chosen, with the metallic side of the patch to the skin and the tan-colored side showing. Once firmly in place, wash your hands well with soap and water. Do not get this medicine into your eyes. After removing the patch, wash your hands and the area behind your ear thoroughly with soap and water. The patch will still contain some medicine after use. To avoid accidental contact or ingestion by children or pets, fold the used patch in half with the sticky side together and throw away in the trash out of  the reach of children and pets. If you need to use a second patch after you remove the first, place it behind the other ear. A special MedGuide will be given to you by the pharmacist with each prescription and refill. Be sure to read this information carefully each time. Talk to your pediatrician regarding the use of this medicine in children. Special care may be needed. Overdosage: If you think you have taken too much of this medicine contact a poison control center or emergency room at once. NOTE: This medicine is only for you. Do not share this medicine with others. What if I miss a dose? This does not apply. This medicine is not for regular use. What may interact with this medicine?  alcohol  antihistamines for allergy cough and cold  atropine  certain medicines for anxiety or sleep  certain medicines for bladder problems like oxybutynin, tolterodine  certain medicines for depression like amitriptyline, fluoxetine, sertraline  certain medicines for stomach problems like dicyclomine, hyoscyamine  certain medicines for Parkinson's disease like benztropine, trihexyphenidyl  certain medicines for seizures like phenobarbital, primidone  general anesthetics like halothane, isoflurane,  methoxyflurane, propofol  ipratropium  local anesthetics like lidocaine, pramoxine, tetracaine  medicines that relax muscles for surgery  phenothiazines like chlorpromazine, mesoridazine, prochlorperazine, thioridazine  narcotic medicines for pain  other belladonna alkaloids This list may not describe all possible interactions. Give your health care provider a list of all the medicines, herbs, non-prescription drugs, or dietary supplements you use. Also tell them if you smoke, drink alcohol, or use illegal drugs. Some items may interact with your medicine. What should I watch for while using this medicine? Limit contact with water while swimming and bathing because the patch may fall off. If the patch falls off, throw it away and put a new one behind the other ear. You may get drowsy or dizzy. Do not drive, use machinery, or do anything that needs mental alertness until you know how this medicine affects you. Do not stand or sit up quickly, especially if you are an older patient. This reduces the risk of dizzy or fainting spells. Alcohol may interfere with the effect of this medicine. Avoid alcoholic drinks. Your mouth may get dry. Chewing sugarless gum or sucking hard candy, and drinking plenty of water may help. Contact your healthcare professional if the problem does not go away or is severe. This medicine may cause dry eyes and blurred vision. If you wear contact lenses, you may feel some discomfort. Lubricating drops may help. See your healthcare professional if the problem does not go away or is severe. If you are going to need surgery, an MRI, CT scan, or other procedure, tell your healthcare professional that you are using this medicine. You may need to remove the patch before the procedure. What side effects may I notice from receiving this medicine? Side effects that you should report to your doctor or health care professional as soon as possible:  allergic reactions like skin rash,  itching or hives; swelling of the face, lips, or tongue  blurred vision  changes in vision  confusion  dizziness  eye pain  fast, irregular heartbeat  hallucinations, loss of contact with reality  nausea, vomiting  pain or trouble passing urine  restlessness  seizures  skin irritation  stomach pain Side effects that usually do not require medical attention (report to your doctor or health care professional if they continue or are bothersome):  drowsiness  dry mouth  headache  sore  throat This list may not describe all possible side effects. Call your doctor for medical advice about side effects. You may report side effects to FDA at 1-800-FDA-1088. Where should I keep my medicine? Keep out of the reach of children. Store at room temperature between 20 and 25 degrees C (68 and 77 degrees F). Keep this medicine in the foil package until ready to use. Throw away any unused medicine after the expiration date. NOTE: This sheet is a summary. It may not cover all possible information. If you have questions about this medicine, talk to your doctor, pharmacist, or health care provider.  2020 Elsevier/Gold Standard (2017-08-12 16:14:46)   General Anesthesia, Adult, Care After This sheet gives you information about how to care for yourself after your procedure. Your health care provider may also give you more specific instructions. If you have problems or questions, contact your health care provider. What can I expect after the procedure? After the procedure, the following side effects are common:  Pain or discomfort at the IV site.  Nausea.  Vomiting.  Sore throat.  Trouble concentrating.  Feeling cold or chills.  Weak or tired.  Sleepiness and fatigue.  Soreness and body aches. These side effects can affect parts of the body that were not involved in surgery. Follow these instructions at home:  For at least 24 hours after the procedure:  Have a  responsible adult stay with you. It is important to have someone help care for you until you are awake and alert.  Rest as needed.  Do not: ? Participate in activities in which you could fall or become injured. ? Drive. ? Use heavy machinery. ? Drink alcohol. ? Take sleeping pills or medicines that cause drowsiness. ? Make important decisions or sign legal documents. ? Take care of children on your own. Eating and drinking  Follow any instructions from your health care provider about eating or drinking restrictions.  When you feel hungry, start by eating small amounts of foods that are soft and easy to digest (bland), such as toast. Gradually return to your regular diet.  Drink enough fluid to keep your urine pale yellow.  If you vomit, rehydrate by drinking water, juice, or clear broth. General instructions  If you have sleep apnea, surgery and certain medicines can increase your risk for breathing problems. Follow instructions from your health care provider about wearing your sleep device: ? Anytime you are sleeping, including during daytime naps. ? While taking prescription pain medicines, sleeping medicines, or medicines that make you drowsy.  Return to your normal activities as told by your health care provider. Ask your health care provider what activities are safe for you.  Take over-the-counter and prescription medicines only as told by your health care provider.  If you smoke, do not smoke without supervision.  Keep all follow-up visits as told by your health care provider. This is important. Contact a health care provider if:  You have nausea or vomiting that does not get better with medicine.  You cannot eat or drink without vomiting.  You have pain that does not get better with medicine.  You are unable to pass urine.  You develop a skin rash.  You have a fever.  You have redness around your IV site that gets worse. Get help right away if:  You have  difficulty breathing.  You have chest pain.  You have blood in your urine or stool, or you vomit blood. Summary  After the procedure, it is  common to have a sore throat or nausea. It is also common to feel tired.  Have a responsible adult stay with you for the first 24 hours after general anesthesia. It is important to have someone help care for you until you are awake and alert.  When you feel hungry, start by eating small amounts of foods that are soft and easy to digest (bland), such as toast. Gradually return to your regular diet.  Drink enough fluid to keep your urine pale yellow.  Return to your normal activities as told by your health care provider. Ask your health care provider what activities are safe for you. This information is not intended to replace advice given to you by your health care provider. Make sure you discuss any questions you have with your health care provider. Document Revised: 05/27/2017 Document Reviewed: 01/07/2017 Elsevier Patient Education  [place].

## 2019-09-19 NOTE — H&P (Signed)
..  History and Physical paper copy reviewed and updated date of procedure and will be scanned into system.  Patient seen and examined.  

## 2019-09-19 NOTE — Anesthesia Procedure Notes (Signed)
Procedure Name: Intubation Date/Time: 09/19/2019 8:44 AM Performed by: Vanetta Shawl, CRNA Pre-anesthesia Checklist: Patient identified, Emergency Drugs available, Suction available, Patient being monitored and Timeout performed Patient Re-evaluated:Patient Re-evaluated prior to induction Oxygen Delivery Method: Circle system utilized Preoxygenation: Pre-oxygenation with 100% oxygen Induction Type: IV induction Ventilation: Mask ventilation without difficulty Grade View: Grade I Tube type: Oral Rae Tube size: 6.5 mm Number of attempts: 1 Placement Confirmation: ETT inserted through vocal cords under direct vision,  positive ETCO2 and breath sounds checked- equal and bilateral Tube secured with: Tape Dental Injury: Teeth and Oropharynx as per pre-operative assessment

## 2019-09-19 NOTE — Op Note (Signed)
..  09/19/2019  8:58 AM    Tiffany Mathews  [id number]   Pre-Op Dx:  Right inferior tonsil mass  Post-op Dx: same  Proc:Excision of right tonsil mass  Surg: Jeannie Fend Sueanne Maniaci  Anes:  General Endotracheal  EBL:  <25ml  Comp:  None  Findings:  Multi-lobulated cystic mass excised from inferior aspect of right tonsil. Cryptic tonsil tissue excised as well.  Procedure: After the patient was identified in holding and the history and physical and consent was reviewed, the patient was taken to the operating room and placed in a supine position.  General endotracheal anesthesia was induced in the normal fashion.  At this time, the patient was rotated 45 degrees and a shoulder roll was placed.  At this time, a McIvor mouthgag was inserted into the patient's oral cavity and suspended from the [place] stand without injury to teeth, lips, or gums.  Next a red rubber catheter was inserted into the patient left nostril for retraction of the uvula and soft palate superiorly.  Next a curved Alice clamp was attached to the patient's right inferior tonsillar pole mass and retracted medially and inferiorly.  A Bovie electrocautery was used to dissect the patient's right tonsil in a subcapsular plane around the inferior tonsil mass and this was removed with a cuff of normal appearing cryptic tonsil tissue..  Meticulous hemostasis was achieved with Bovie suction cautery.    No further abnormal masses or lesions were seen.  The superior aspect of the patient's right tonsil was left in place given normal appearance.  At this time, the patient's nasal cavity and oral cavity was irrigated with sterile saline.  T  Following this  The care of patient was returned to anesthesia, awakened, and transferred to recovery in stable condition.  Dispo:  PACU to home  Plan: Soft diet.  Limit exercise and strenuous activity for 2 weeks.  Fluid hydration  Recheck my office three weeks.   Jeannie Fend [name] 8:58  AM 09/19/2019

## 2019-09-19 NOTE — Anesthesia Preprocedure Evaluation (Signed)
Anesthesia Evaluation  Patient identified by MRN, date of birth, ID band Patient awake    Reviewed: Allergy & Precautions, H&P , NPO status , Patient's Chart, lab work & pertinent test results  Airway Mallampati: III  TM Distance: >3 FB Neck ROM: full    Dental no notable dental hx.    Pulmonary sleep apnea , Current Smoker,    Pulmonary exam normal breath sounds clear to auscultation       Cardiovascular hypertension, Normal cardiovascular exam Rhythm:regular Rate:Normal     Neuro/Psych PSYCHIATRIC DISORDERS    GI/Hepatic GERD  ,  Endo/Other    Renal/GU      Musculoskeletal   Abdominal   Peds  Hematology   Anesthesia Other Findings   Reproductive/Obstetrics                             Anesthesia Physical Anesthesia Plan  ASA: II  Anesthesia Plan: General ETT   Post-op Pain Management:    Induction:   PONV Risk Score and Plan: 3 and Treatment may vary due to age or medical condition, Scopolamine patch - Pre-op, Dexamethasone, Ondansetron and Midazolam  Airway Management Planned:   Additional Equipment:   Intra-op Plan:   Post-operative Plan:   Informed Consent: I have reviewed the patients History and Physical, chart, labs and discussed the procedure including the risks, benefits and alternatives for the proposed anesthesia with the patient or authorized representative who has indicated his/her understanding and acceptance.     Dental Advisory Given  Plan Discussed with: CRNA  Anesthesia Plan Comments:         Anesthesia Quick Evaluation

## 2019-09-20 ENCOUNTER — Encounter: Payer: Self-pay | Admitting: *Deleted

## 2019-09-21 LAB — SURGICAL PATHOLOGY

## 2019-09-26 ENCOUNTER — Encounter: Payer: Self-pay | Admitting: Radiation Oncology

## 2019-09-26 ENCOUNTER — Other Ambulatory Visit: Payer: Self-pay

## 2019-09-26 ENCOUNTER — Ambulatory Visit
Admission: RE | Admit: 2019-09-26 | Discharge: 2019-09-26 | Disposition: A | Discharge status: Home / Self Care | Payer: Medicare Other | Source: Ambulatory Visit | Attending: Radiation Oncology | Admitting: Radiation Oncology

## 2019-09-26 VITALS — BP 134/86 | HR 83 | Temp 98.0°F | Resp 16 | Wt 171.6 lb

## 2019-09-26 DIAGNOSIS — C50412 Malignant neoplasm of upper-outer quadrant of left female breast: Secondary | ICD-10-CM | POA: Diagnosis not present

## 2019-09-26 DIAGNOSIS — Z923 Personal history of irradiation: Secondary | ICD-10-CM | POA: Diagnosis not present

## 2019-09-26 DIAGNOSIS — Z79811 Long term (current) use of aromatase inhibitors: Secondary | ICD-10-CM | POA: Insufficient documentation

## 2019-09-26 DIAGNOSIS — Z170 Estrogen receptor positive status [ER+]: Secondary | ICD-10-CM | POA: Insufficient documentation

## 2019-09-26 NOTE — Progress Notes (Signed)
Radiation Oncology Follow up Note  Name: Tiffany Mathews   Date:   09/26/2019 MRN:  [id number] DOB: 1961-05-07    This 59 y.o. female presents to the clinic today for 1 year follow-up status post whole breast radiation to her left breast for stage I ER/PR positive invasive mammary carcinoma.  REFERRING PROVIDER: Cletis Athens, MD  HPI: Patient is a 59 year old female now out 1 year having completed whole breast radiation to her left breast for stage I ER/PR positive invasive mammary carcinoma.  Seen today in routine follow-up she is doing well.  She specifically denies breast tenderness cough or bone pain..  She had a mammogram back in October which I have reviewed was BI-RADS 2 benign.  She is currently on Arimidex tolerating it well without side effect.  COMPLICATIONS OF TREATMENT: none  FOLLOW UP COMPLIANCE: keeps appointments   PHYSICAL EXAM:  BP 134/86 (BP Location: Right Arm, Patient Position: Sitting, Cuff Size: Normal)   Pulse 83   Temp 98 F (36.7 C) (Tympanic)   Resp 16   Wt 171 lb 9.6 oz (77.8 kg)   BMI 28.56 kg/m  Lungs are clear to A&P cardiac examination essentially unremarkable with regular rate and rhythm. No dominant mass or nodularity is noted in either breast in 2 positions examined. Incision is well-healed. No axillary or supraclavicular adenopathy is appreciated. Cosmetic result is excellent.  Well-developed well-nourished patient in NAD. HEENT reveals PERLA, EOMI, discs not visualized.  Oral cavity is clear. No oral mucosal lesions are identified. Neck is clear without evidence of cervical or supraclavicular adenopathy. Lungs are clear to A&P. Cardiac examination is essentially unremarkable with regular rate and rhythm without murmur rub or thrill. Abdomen is benign with no organomegaly or masses noted. Motor sensory and DTR levels are equal and symmetric in the upper and lower extremities. Cranial nerves II through XII are grossly intact. Proprioception is intact. No  peripheral adenopathy or edema is identified. No motor or sensory levels are noted. Crude visual fields are within normal range.  RADIOLOGY RESULTS: Mammograms reviewed compatible with above-stated findings  PLAN: Present time patient is doing well with no evidence of disease 1 year out.  I have asked to see her back in 1 year for follow-up.  She already scheduled for follow-up mammograms.  She continues on Arimidex without side effect.  Patient knows to call with any concerns.  I would like to take this opportunity to thank you for allowing me to participate in the care of your patient.Noreene Filbert, MD

## 2019-10-10 ENCOUNTER — Other Ambulatory Visit: Payer: Self-pay | Admitting: Gastroenterology

## 2019-10-16 ENCOUNTER — Other Ambulatory Visit: Payer: Self-pay | Admitting: Internal Medicine

## 2019-10-29 ENCOUNTER — Other Ambulatory Visit: Payer: Self-pay | Admitting: Internal Medicine

## 2019-11-20 ENCOUNTER — Telehealth: Payer: Self-pay | Admitting: Internal Medicine

## 2019-11-20 ENCOUNTER — Other Ambulatory Visit: Payer: Self-pay | Admitting: Internal Medicine

## 2019-12-06 NOTE — Telephone Encounter (Signed)
This has been taken care of.

## 2019-12-15 ENCOUNTER — Other Ambulatory Visit: Payer: Self-pay | Admitting: Internal Medicine

## 2020-01-07 ENCOUNTER — Other Ambulatory Visit: Payer: Self-pay

## 2020-01-07 MED ORDER — ATORVASTATIN CALCIUM 40 MG PO TABS: 40.0000 mg | ORAL_TABLET | Freq: Every day | ORAL | 2 refills | Status: DC

## 2020-01-21 ENCOUNTER — Other Ambulatory Visit: Payer: Self-pay | Admitting: Internal Medicine

## 2020-02-15 ENCOUNTER — Other Ambulatory Visit: Payer: Self-pay | Admitting: General Surgery

## 2020-02-15 DIAGNOSIS — Z853 Personal history of malignant neoplasm of breast: Secondary | ICD-10-CM

## 2020-02-18 ENCOUNTER — Other Ambulatory Visit: Payer: Self-pay

## 2020-02-18 ENCOUNTER — Other Ambulatory Visit: Payer: Self-pay | Admitting: Internal Medicine

## 2020-02-18 MED ORDER — CYCLOBENZAPRINE HCL 10 MG PO TABS: 10.0000 mg | ORAL_TABLET | Freq: Every day | ORAL | 0 refills | Status: DC

## 2020-02-28 IMAGING — MG DIGITAL SCREENING BILATERAL MAMMOGRAM WITH TOMO AND CAD
8 series · 8 of 24 positions shown · non-contrast
Comparison: Previous exam(s).

CLINICAL DATA: Screening.

EXAM:
DIGITAL SCREENING BILATERAL MAMMOGRAM WITH TOMO AND CAD

[R MLO synth-2D]
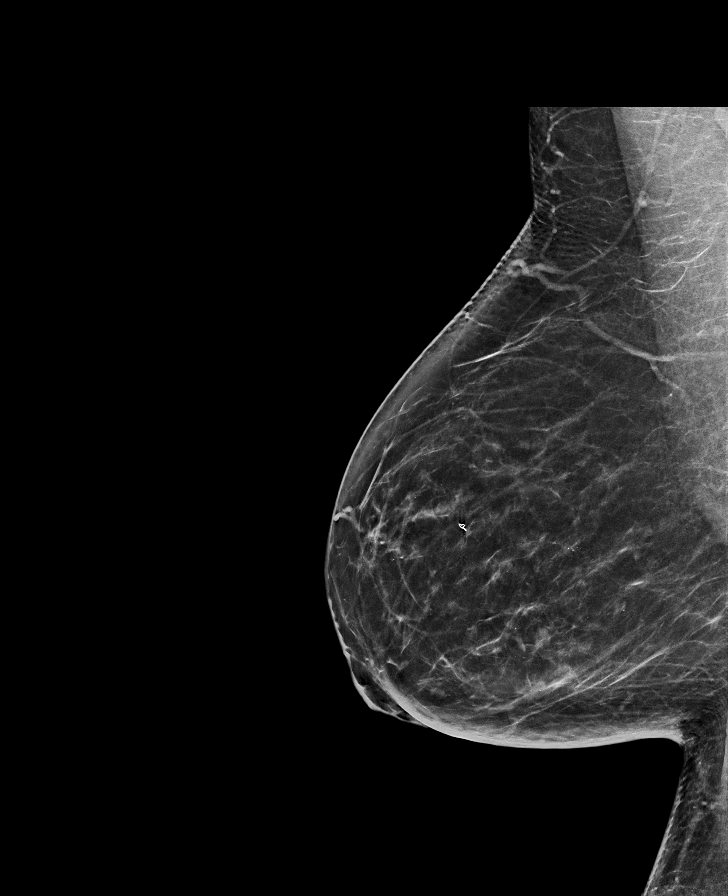

[R CC synth-2D]
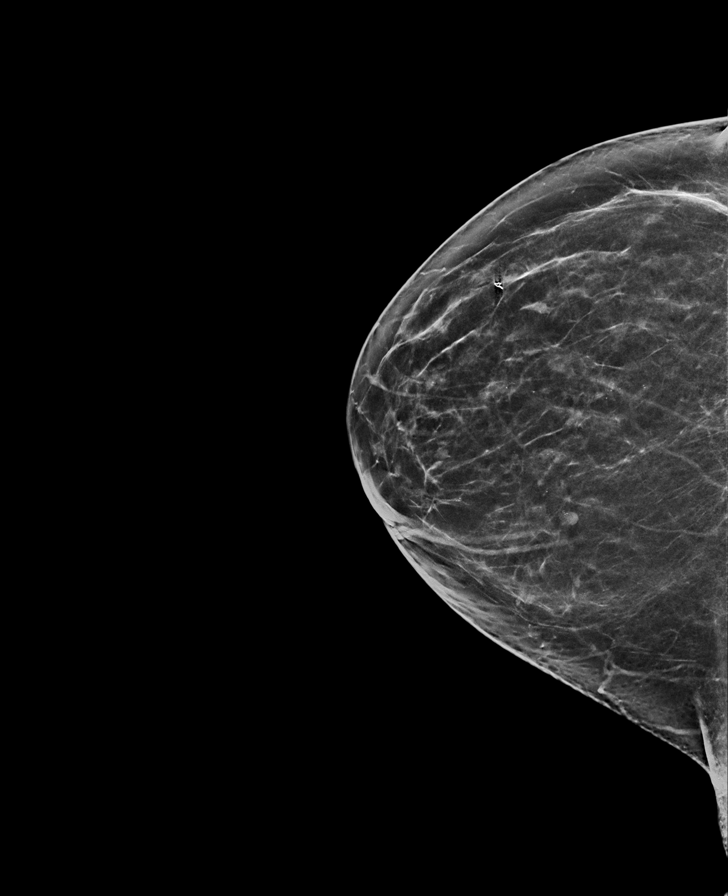

[L CC synth-2D]
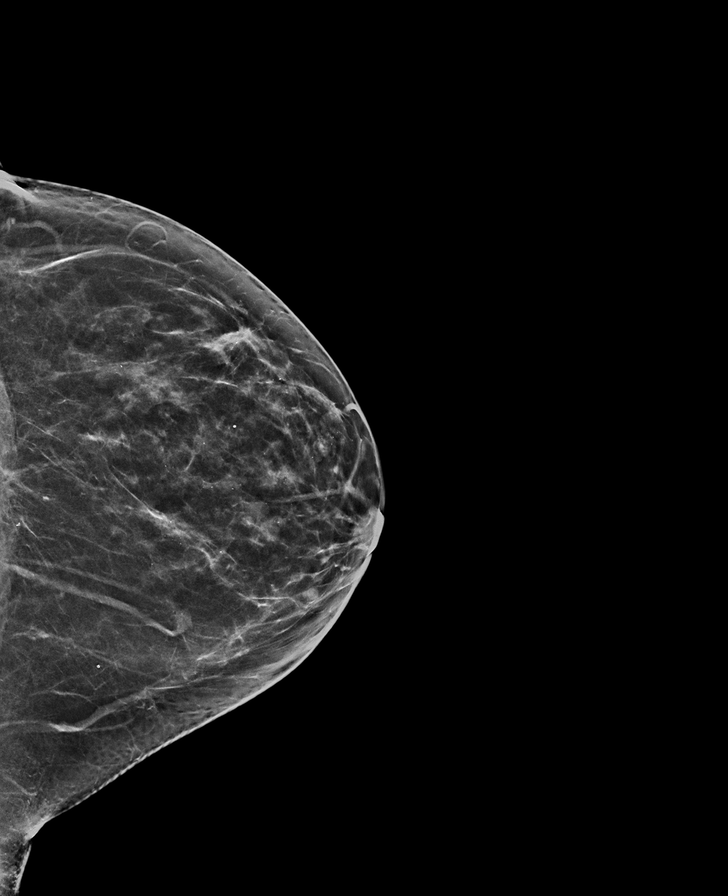

[L MLO synth-2D]
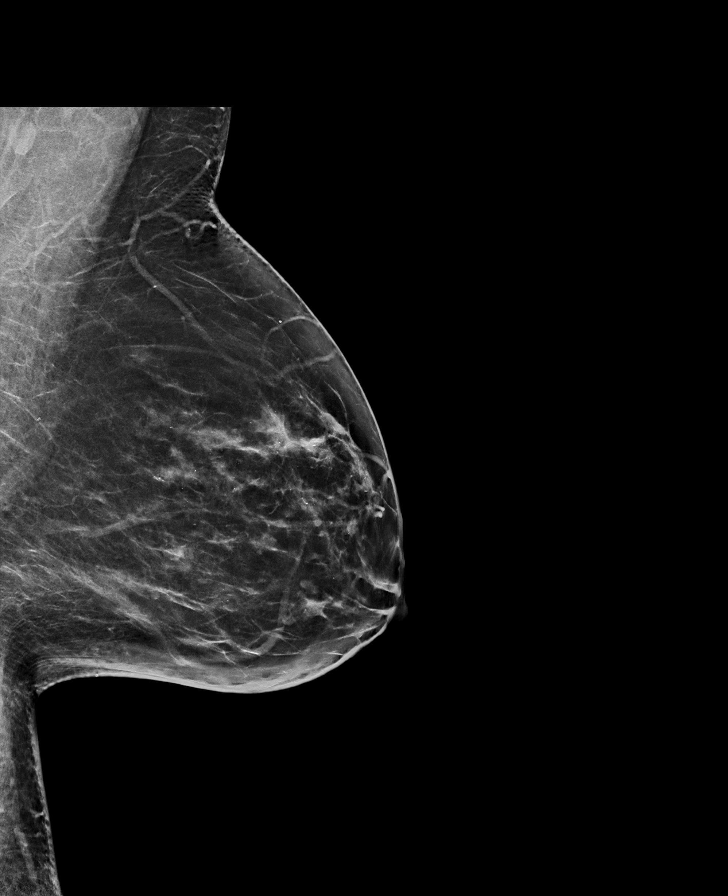

[L MLO tomo · tomo slice 37/73.0]
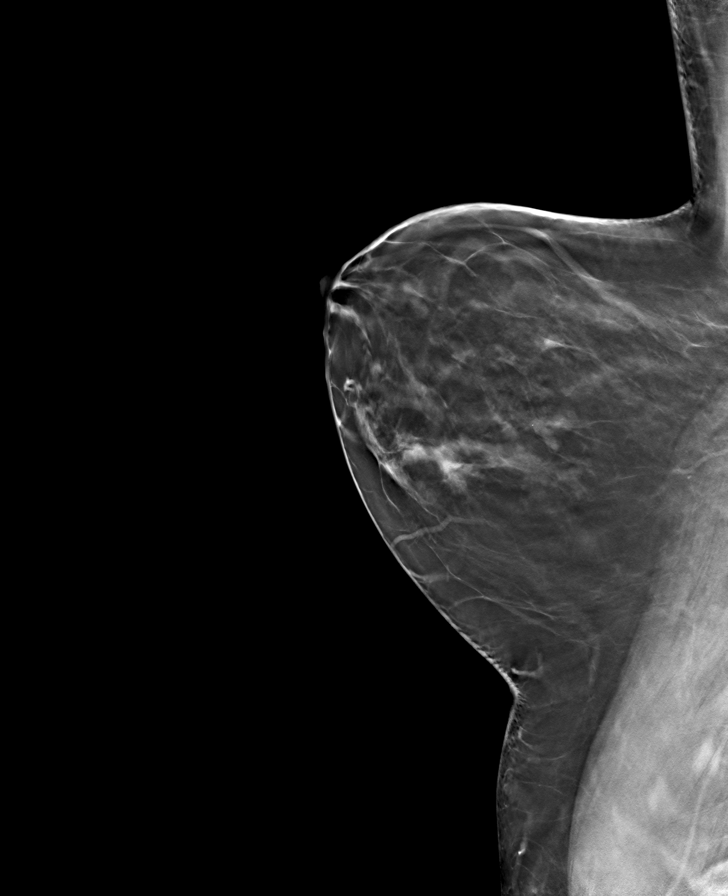

[L CC tomo · tomo slice 31/62.0]
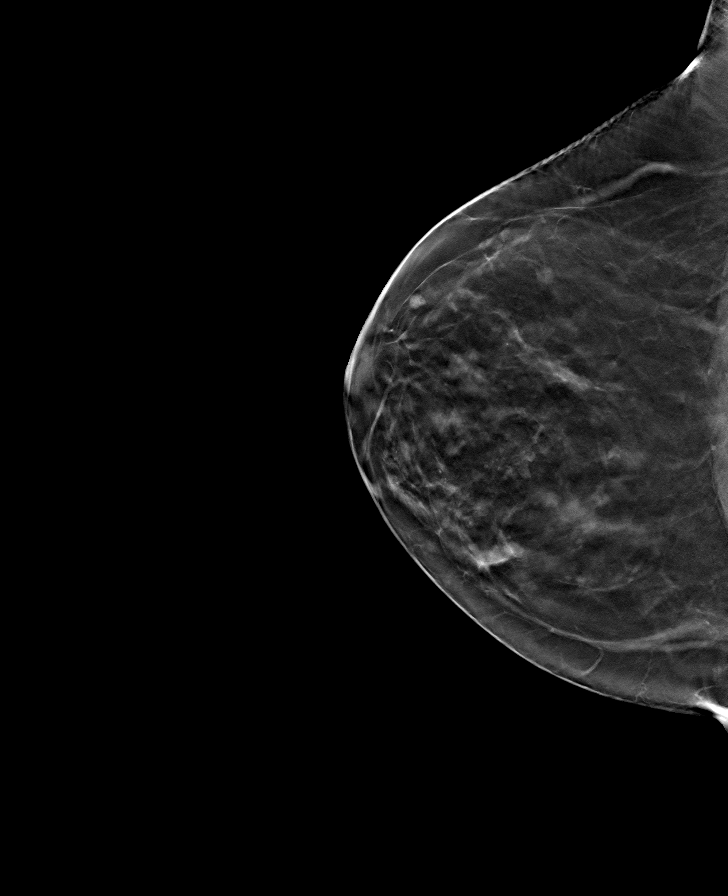

[R CC tomo · tomo slice 32/63.0]
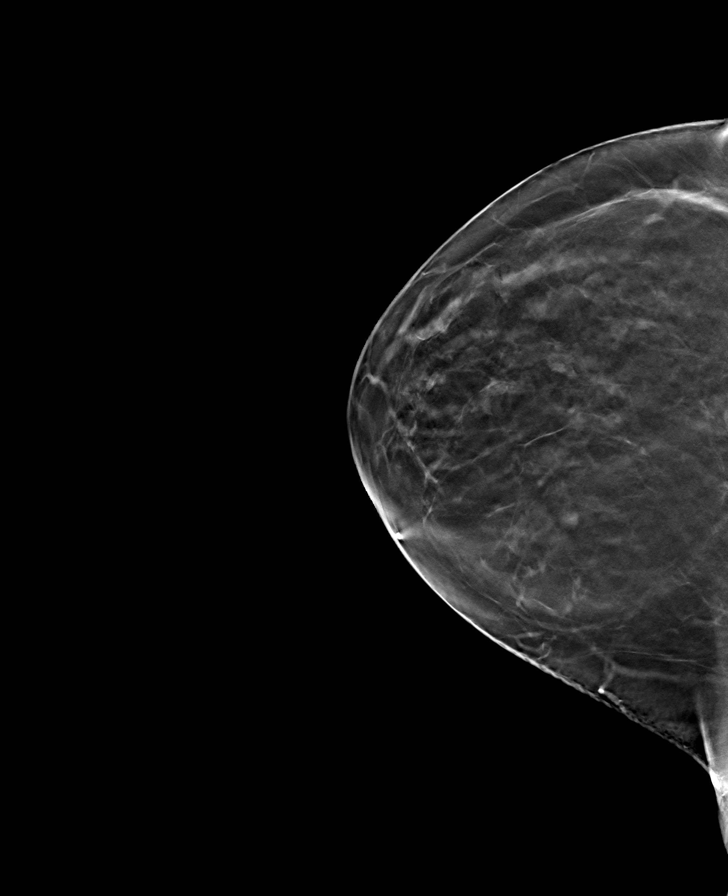

[R MLO tomo · tomo slice 35/70.0]
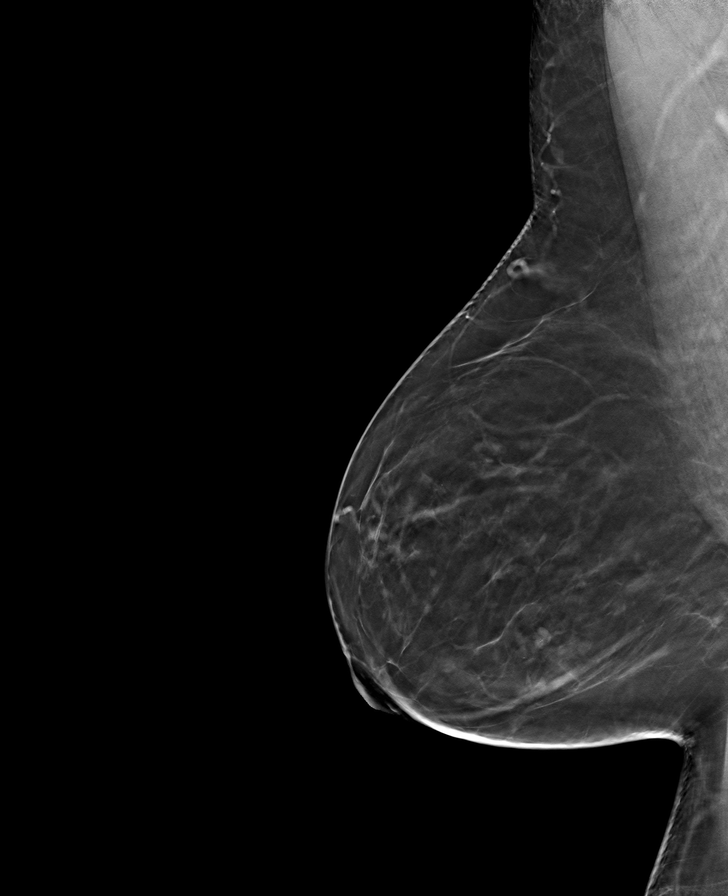

[8 of 24 positions shown; findings below may reference images not displayed]

ACR Breast Density Category b: There are scattered areas of
fibroglandular density.
FINDINGS: In the right breast , a possible mass requires further evaluation.

In the left breast , a possible asymmetry requires further
evaluation.

Images were processed with CAD.
IMPRESSION: Further evaluation is suggested for possible mass in the right
breast.

Further evaluation is suggested for possible asymmetry in the left
breast.

RECOMMENDATION:
Diagnostic mammogram and possibly ultrasound of both breasts.
(Code:[id number])

The patient will be contacted regarding the findings, and additional
imaging will be scheduled.

BI-RADS CATEGORY  0: Incomplete. Need additional imaging evaluation
and/or prior mammograms for comparison.

## 2020-03-06 ENCOUNTER — Encounter: Payer: Self-pay | Admitting: Oncology

## 2020-03-06 ENCOUNTER — Inpatient Hospital Stay: Payer: Medicare Other | Attending: Oncology | Admitting: Oncology

## 2020-03-06 ENCOUNTER — Other Ambulatory Visit: Payer: Self-pay

## 2020-03-06 VITALS — BP 140/82 | HR 105 | Temp 98.0°F | Resp 20 | Wt 182.6 lb

## 2020-03-06 DIAGNOSIS — Z5181 Encounter for therapeutic drug level monitoring: Secondary | ICD-10-CM

## 2020-03-06 DIAGNOSIS — Z7982 Long term (current) use of aspirin: Secondary | ICD-10-CM | POA: Diagnosis not present

## 2020-03-06 DIAGNOSIS — Z803 Family history of malignant neoplasm of breast: Secondary | ICD-10-CM | POA: Insufficient documentation

## 2020-03-06 DIAGNOSIS — Z833 Family history of diabetes mellitus: Secondary | ICD-10-CM | POA: Diagnosis not present

## 2020-03-06 DIAGNOSIS — Z79899 Other long term (current) drug therapy: Secondary | ICD-10-CM | POA: Diagnosis not present

## 2020-03-06 DIAGNOSIS — F419 Anxiety disorder, unspecified: Secondary | ICD-10-CM | POA: Insufficient documentation

## 2020-03-06 DIAGNOSIS — E785 Hyperlipidemia, unspecified: Secondary | ICD-10-CM | POA: Insufficient documentation

## 2020-03-06 DIAGNOSIS — G4730 Sleep apnea, unspecified: Secondary | ICD-10-CM | POA: Insufficient documentation

## 2020-03-06 DIAGNOSIS — C50412 Malignant neoplasm of upper-outer quadrant of left female breast: Secondary | ICD-10-CM | POA: Diagnosis present

## 2020-03-06 DIAGNOSIS — Z79811 Long term (current) use of aromatase inhibitors: Secondary | ICD-10-CM

## 2020-03-06 DIAGNOSIS — Z8249 Family history of ischemic heart disease and other diseases of the circulatory system: Secondary | ICD-10-CM | POA: Insufficient documentation

## 2020-03-06 DIAGNOSIS — I10 Essential (primary) hypertension: Secondary | ICD-10-CM | POA: Diagnosis not present

## 2020-03-06 DIAGNOSIS — Z170 Estrogen receptor positive status [ER+]: Secondary | ICD-10-CM | POA: Diagnosis not present

## 2020-03-06 DIAGNOSIS — Z08 Encounter for follow-up examination after completed treatment for malignant neoplasm: Secondary | ICD-10-CM | POA: Diagnosis not present

## 2020-03-06 DIAGNOSIS — F319 Bipolar disorder, unspecified: Secondary | ICD-10-CM | POA: Insufficient documentation

## 2020-03-06 DIAGNOSIS — K219 Gastro-esophageal reflux disease without esophagitis: Secondary | ICD-10-CM | POA: Diagnosis not present

## 2020-03-06 DIAGNOSIS — Z853 Personal history of malignant neoplasm of breast: Secondary | ICD-10-CM

## 2020-03-06 DIAGNOSIS — Z800 Family history of malignant neoplasm of digestive organs: Secondary | ICD-10-CM | POA: Insufficient documentation

## 2020-03-06 DIAGNOSIS — F17210 Nicotine dependence, cigarettes, uncomplicated: Secondary | ICD-10-CM | POA: Diagnosis not present

## 2020-03-06 DIAGNOSIS — Z808 Family history of malignant neoplasm of other organs or systems: Secondary | ICD-10-CM | POA: Insufficient documentation

## 2020-03-08 NOTE — Progress Notes (Signed)
Hematology/Oncology Consult note [facility]  Telephone:([phone] [phone]  Patient Care Team: Cletis Athens, MD as PCP - General (Internal Medicine) Noreene Filbert, MD as Radiation Oncologist (Radiation Oncology)   Name of the patient: Tiffany Mathews  [id number]  11-06-1960   Date of visit: 03/08/20  Diagnosis-history of stage I ER positive left breast cancer  Chief complaint/ Reason for visit-routine follow-up of breast cancer on Arimidex  Heme/Onc history: patient is a 59 year old Hispanic female who underwent bilateral screening mammogram in September 2019 which showed a suspicious mass in the left breast 2 o'clock position measuring 0.8 x 0.5 x 0.8 cm in size. Approximately 1.2 cm lateral to this mass were 2-3 tightly clustered hypoechoic nodules together measuring 29 x 0.2 x 0.8 cm. There was also an other hypoechoic area which was suggestive of fibrocystic tissue. Several other tiny hypoechoic masses were noted with prominent ducts which could represent fibrocystic change. DCIS is also a concern. Ultrasound of the left axilla did not reveal any pathologic adenopathy. Patient underwent core biopsy of the dominant mass as well as the cluster of nodules immediately adjacent to it. 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 Biopsy of the second breast mass was negative for atypia and malignancy.  MRI of bilateral breasts on 04/11/2018 showed 0.8 cm biopsy-proven malignancy in the upper outer quadrant of the left breast. Second mass in the upper outer quadrant of the left breast which is known and pathology to be benign and enhances to a lesser degree than does the biopsy malignancy.  Patient underwent lumpectomy and sentinel lymph node biopsy on 04/19/2018. Final pathology showed invasive mammary carcinoma, grade 1, 10 mm. 2 sentinel lymph nodes  were negative for malignancy. Negative margins. ER PR greater than 90% positive. HER-2/neu negative. pT1b pN0  Patient did undergo genetic testing which did not reveal any mutation.   Interval history-patient reports tolerating Arimidex well and denies any complaints at this time.  Appetite and weight have remained stable and she denies any new aches and pains anywhere  ECOG PS- 0 Pain scale- 0  Review of systems- Review of Systems  Constitutional: Negative for chills, fever, malaise/fatigue and weight loss.  HENT: Negative for congestion, ear discharge and nosebleeds.   Eyes: Negative for blurred vision.  Respiratory: Negative for cough, hemoptysis, sputum production, shortness of breath and wheezing.   Cardiovascular: Negative for chest pain, palpitations, orthopnea and claudication.  Gastrointestinal: Negative for abdominal pain, blood in stool, constipation, diarrhea, heartburn, melena, nausea and vomiting.  Genitourinary: Negative for dysuria, flank pain, frequency, hematuria and urgency.  Musculoskeletal: Negative for back pain, joint pain and myalgias.  Skin: Negative for rash.  Neurological: Negative for dizziness, tingling, focal weakness, seizures, weakness and headaches.  Endo/Heme/Allergies: Does not bruise/bleed easily.  Psychiatric/Behavioral: Negative for depression and suicidal ideas. The patient does not have insomnia.      Allergies  Allergen Reactions  . Lamictal [Lamotrigine] Rash and Other (See Comments)  . Depakote [Divalproex Sodium] Other (See Comments)    Severe hair loss  . Other Other (See Comments)    Flowers and pollen  . Lithium Other (See Comments)    Unknown; "It just made me feel bad."     Past Medical History:  Diagnosis Date  . Anxiety   . Arthritis   . Bipolar 1 disorder ([place])   . Breast cancer ([place])   . Depression   .  Fibromyalgia   . GERD (gastroesophageal reflux disease)   . History of hiatal hernia   . Hydradenitis   .  Hyperlipidemia   . Hypertension   . Multiple thyroid nodules    benign  . Sleep apnea    has CPAP, doesn't use     Past Surgical History:  Procedure Laterality Date  . ABLATION  04/2011  . BREAST BIOPSY Right 2009   INVASIVE MAMMARY CARCINOMA, GRADE 1  . BREAST LUMPECTOMY Left 04/19/2018   INVASIVE MAMMARY CARCINOMA, GRADE 1  . CESAREAN SECTION     x3  . CHOLECYSTECTOMY    . COLONOSCOPY WITH PROPOFOL N/A 03/09/2019   Procedure: COLONOSCOPY WITH PROPOFOL;  Surgeon: Lucilla Lame, MD;  Location: [place];  Service: Endoscopy;  Laterality: N/A;  sleep apnea  . GALLBLADDER SURGERY    . LYSIS OF ADHESION  05/27/2016   Procedure: LYSIS OF ADHESION;  Surgeon: [place] Pert, MD;  Location: [facility];  Service: General;;  . PARTIAL MASTECTOMY WITH NEEDLE LOCALIZATION Left 04/19/2018   Procedure: PARTIAL MASTECTOMY WITH NEEDLE LOCALIZATION;  Surgeon: Herbert Pun, MD;  Location: [facility];  Service: General;  Laterality: Left;  . ROBOTIC ASSISTED LAPAROSCOPIC CHOLECYSTECTOMY N/A 05/27/2016   Procedure: ROBOTIC ASSISTED LAPAROSCOPIC CHOLECYSTECTOMY;  Surgeon: [place] Pert, MD;  Location: [facility];  Service: General;  Laterality: N/A;  . SENTINEL NODE BIOPSY Left 04/19/2018   Procedure: SENTINEL NODE BIOPSY;  Surgeon: Herbert Pun, MD;  Location: [facility];  Service: General;  Laterality: Left;  . SHOULDER ARTHROSCOPY  2000  . TONSILLECTOMY Right 09/19/2019   Procedure: PARTIAL VS TOTAL TONSILLECTOMY;  Surgeon: Carloyn Manner, MD;  Location: [place];  Service: ENT;  Laterality: Right;  . TUBAL LIGATION      Social History   Socioeconomic History  . Marital status: Divorced    Spouse name: Not on file  . Number of children: Not on file  . Years of education: Not on file  . Highest education level: Not on file  Occupational History  . Not on file  Tobacco Use  . Smoking status: Current Every Day Smoker    Packs/day: 1.00    Years: 28.00     Pack years: 28.00    Types: Cigarettes  . Smokeless tobacco: Never Used  . Tobacco comment: started around 1993  Vaping Use  . Vaping Use: Former  Substance and Sexual Activity  . Alcohol use: No  . Drug use: No  . Sexual activity: Not Currently  Other Topics Concern  . Not on file  Social History Narrative  . Not on file   Social Determinants of Health   Financial Resource Strain:   . Difficulty of Paying Living Expenses: Not on file  Food Insecurity:   . Worried About Charity fundraiser in the Last Year: Not on file  . Ran Out of Food in the Last Year: Not on file  Transportation Needs:   . Lack of Transportation (Medical): Not on file  . Lack of Transportation (Non-Medical): Not on file  Physical Activity:   . Days of Exercise per Week: Not on file  . Minutes of Exercise per Session: Not on file  Stress:   . Feeling of Stress : Not on file  Social Connections:   . Frequency of Communication with Friends and Family: Not on file  . Frequency of Social Gatherings with Friends and Family: Not on file  . Attends Religious Services: Not on file  . Active Member of Clubs  or Organizations: Not on file  . Attends Archivist Meetings: Not on file  . Marital Status: Not on file  Intimate Partner Violence:   . Fear of Current or Ex-Partner: Not on file  . Emotionally Abused: Not on file  . Physically Abused: Not on file  . Sexually Abused: Not on file    Family History  Problem Relation Age of Onset  . Thyroid cancer Mother        dx 32s; currently 32  . Hypertension Mother   . Diabetes Mother   . Breast cancer Maternal Aunt 37       deceased 44s  . Alzheimer's disease Father        deceased 62  . Stroke Father   . Drug abuse Sister        deceased 72  . Diabetes Brother   . Hypertension Brother   . Heart attack Maternal Grandfather   . Diabetes Brother   . Hypertension Brother   . Alcohol abuse Brother   . Schizophrenia Brother   . Stomach cancer  Paternal Aunt        age at dx unknown  . Stomach cancer Paternal Uncle        age at dx unknown  . Cancer Other        daughter of sister who died at 50; breast ca in 47s and ovarian cancer at 42; currently 10s     Current Outpatient Medications:  .  amitriptyline (ELAVIL) 25 MG tablet, , Disp: , Rfl:  .  anastrozole (ARIMIDEX) 1 MG tablet, Take 1 tablet (1 mg total) by mouth daily., Disp: 90 tablet, Rfl: 3 .  aspirin EC 81 MG tablet, Take 81 mg by mouth daily. , Disp: , Rfl:  .  atorvastatin (LIPITOR) 40 MG tablet, Take 1 tablet (40 mg total) by mouth daily., Disp: 90 tablet, Rfl: 2 .  clonazePAM (KLONOPIN) 0.5 MG tablet, Take 0.5 mg by mouth 2 (two) times daily. , Disp: , Rfl:  .  cyclobenzaprine (FLEXERIL) 10 MG tablet, Take 1 tablet by mouth once daily, Disp: 30 tablet, Rfl: 0 .  cyclobenzaprine (FLEXERIL) 10 MG tablet, Take 1 tablet (10 mg total) by mouth daily., Disp: 30 tablet, Rfl: 0 .  fluticasone (FLONASE) 50 MCG/ACT nasal spray, Place 1 spray into both nostrils 2 (two) times daily., Disp: , Rfl:  .  gabapentin (NEURONTIN) 300 MG capsule, Take 300 mg by mouth 3 (three) times daily. , Disp: , Rfl: 1 .  loratadine (CLARITIN) 10 MG tablet, Take 10 mg by mouth daily as needed for allergies., Disp: , Rfl: 3 .  losartan (COZAAR) 100 MG tablet, Take 100 mg by mouth 2 (two) times daily. , Disp: , Rfl:  .  Multiple Vitamins-Minerals (MULTIVITAMIN WITH MINERALS) tablet, Take 1 tablet by mouth daily., Disp: , Rfl:  .  pantoprazole (PROTONIX) 40 MG tablet, Take 1 tablet by mouth once daily, Disp: 90 tablet, Rfl: 1 .  traZODone (DESYREL) 100 MG tablet, Take 100 mg by mouth at bedtime as needed (sleep). , Disp: , Rfl: 1 .  triamcinolone cream (KENALOG) 0.1 %, Apply 1 application topically 2 (two) times daily as needed (for foot rash). , Disp: , Rfl:  .  venlafaxine XR (EFFEXOR-XR) 75 MG 24 hr capsule, Take 1 capsule by mouth daily., Disp: , Rfl:  .  VITAMIN D PO, Take by mouth daily., Disp: ,  Rfl:  .  ziprasidone (GEODON) 40 MG capsule, Take 40 mg by  mouth every evening. , Disp: , Rfl:  .  oxyCODONE (ROXICODONE) 5 MG/5ML solution, Take 7.5 mLs (7.5 mg total) by mouth every 6 (six) hours as needed for severe pain. (Patient not taking: Reported on 03/06/2020), Disp: 240 mL, Rfl: 0  Physical exam:  Vitals:   03/06/20 1131  BP: 140/82  Pulse: (!) 105  Resp: 20  Temp: 98 F (36.7 C)  SpO2: 98%  Weight: 182 lb 9.6 oz (82.8 kg)   Physical Exam Constitutional:      General: She is not in acute distress. Cardiovascular:     Rate and Rhythm: Regular rhythm. Tachycardia present.     Heart sounds: Normal heart sounds.  Pulmonary:     Effort: Pulmonary effort is normal.     Breath sounds: Normal breath sounds.  Abdominal:     General: Bowel sounds are normal.     Palpations: Abdomen is soft.  Skin:    General: Skin is warm and dry.  Neurological:     Mental Status: She is alert and oriented to person, place, and time.     Breast exam was performed in seated and lying down position. Patient is status post left lumpectomy with a well-healed surgical scar. No evidence of any palpable masses. No evidence of axillary adenopathy. No evidence of any palpable masses or lumps in the right breast. No evidence of right axillary adenopathy  CMP Latest Ref Rng & Units 11/21/2018  Glucose 70 - 99 mg/dL 116(H)  BUN 6 - 20 mg/dL 16  Creatinine 0.44 - 1.00 mg/dL 0.79  Sodium 135 - 145 mmol/L 141  Potassium 3.5 - 5.1 mmol/L 3.7  Chloride 98 - 111 mmol/L 106  CO2 22 - 32 mmol/L 25  Calcium 8.9 - 10.3 mg/dL 9.1  Total Protein 6.5 - 8.1 g/dL 7.4  Total Bilirubin 0.3 - 1.2 mg/dL 0.6  Alkaline Phos 38 - 126 U/L 54  AST 15 - 41 U/L 16  ALT 0 - 44 U/L 16   CBC Latest Ref Rng & Units 08/03/2018  WBC 4.0 - 10.5 K/uL 8.6  Hemoglobin 12.0 - 15.0 g/dL 14.0  Hematocrit 36 - 46 % 39.9  Platelets 150 - 400 K/uL 362    Assessment and plan- Patient is a 59 y.o. female with invasive mammary  carcinoma of the left breast stage I ER/PR positive HER-2/neu negative s/p lumpectomy and adjuvant radiation treatment.  She is currently on Arimidex and this is a routine follow-up visit  Clinically patient is doing well with no concerning signs and symptoms of recurrence based on today's exam.  She is currently on Arimidex calcium and vitamin D which she is tolerating well.  She will be due for her mammogram in October 2021 which we will schedule.  She would also be due for a repeat bone density scan in March 2022  I will see her back in 6 months for an in person breast exam no labs Visit Diagnosis 1. Visit for monitoring Arimidex therapy   2. Encounter for follow-up surveillance of breast cancer      Dr. Randa Evens, MD, MPH [facility] [phone] 03/08/2020 10:12 AM

## 2020-03-10 ENCOUNTER — Ambulatory Visit (INDEPENDENT_AMBULATORY_CARE_PROVIDER_SITE_OTHER): Payer: Medicare Other | Admitting: Internal Medicine

## 2020-03-10 ENCOUNTER — Encounter: Payer: Self-pay | Admitting: Internal Medicine

## 2020-03-10 ENCOUNTER — Other Ambulatory Visit: Payer: Self-pay

## 2020-03-10 VITALS — BP 127/84 | HR 103 | Ht 65.0 in | Wt 182.3 lb

## 2020-03-10 DIAGNOSIS — Z0000 Encounter for general adult medical examination without abnormal findings: Secondary | ICD-10-CM | POA: Diagnosis not present

## 2020-03-10 DIAGNOSIS — F341 Dysthymic disorder: Secondary | ICD-10-CM

## 2020-03-10 DIAGNOSIS — F3177 Bipolar disorder, in partial remission, most recent episode mixed: Secondary | ICD-10-CM

## 2020-03-10 DIAGNOSIS — Z23 Encounter for immunization: Secondary | ICD-10-CM

## 2020-03-10 DIAGNOSIS — Z720 Tobacco use: Secondary | ICD-10-CM

## 2020-03-10 DIAGNOSIS — M791 Myalgia, unspecified site: Secondary | ICD-10-CM

## 2020-03-10 DIAGNOSIS — E041 Nontoxic single thyroid nodule: Secondary | ICD-10-CM

## 2020-03-10 DIAGNOSIS — K5792 Diverticulitis of intestine, part unspecified, without perforation or abscess without bleeding: Secondary | ICD-10-CM | POA: Diagnosis not present

## 2020-03-10 DIAGNOSIS — C50412 Malignant neoplasm of upper-outer quadrant of left female breast: Secondary | ICD-10-CM

## 2020-03-10 DIAGNOSIS — Z170 Estrogen receptor positive status [ER+]: Secondary | ICD-10-CM

## 2020-03-10 NOTE — Assessment & Plan Note (Signed)
Patient received influenza vaccine in the office today

## 2020-03-10 NOTE — Assessment & Plan Note (Signed)
Patient complains of myalgia I told her to reduce the Lipitor to 20 mg p.o. daily we will also do the blood test which contain CPK and liver function test.  We will be checking for diabetes.

## 2020-03-10 NOTE — Assessment & Plan Note (Signed)
Patient physical exam was done.  Her heart is regular chest is clear abdomen is soft nontender without any hepatosplenomegaly there is no pedal edema no calf tenderness.  She has few petechial rash on the both legs.  So we will check a CBC for that because she has a history of leukemia in the family.Tiffany Mathews  She was advised to continue seeing Dr. wall for diverticulosis and see the cancer doctor for the breast cancer.

## 2020-03-10 NOTE — Assessment & Plan Note (Addendum)
She is being treated by a psychiatrist for that.  She is in remission on the medication at present time.  She has gained weight and continues to smoke and I told her to walk every day to stop smoking if she can.

## 2020-03-10 NOTE — Patient Instructions (Signed)
Smoking Tobacco Information, Adult  Smoking tobacco can be harmful to your health. Tobacco contains a poisonous (toxic), colorless chemical called nicotine.  Nicotine is addictive. It changes the brain and can make it hard to stop smoking. Tobacco also has other toxic chemicals that can hurt your body and raise your risk of many cancers.   How can smoking tobacco affect me? Smoking tobacco puts you at risk for:  Cancer. Smoking is most commonly associated with lung cancer, but can also lead to cancer in other parts of the body.  Chronic obstructive pulmonary disease (COPD). This is a long-term lung condition that makes it hard to breathe. It also gets worse over time.  High blood pressure (hypertension), heart disease, stroke, or heart attack.  Lung infections, such as pneumonia.  Cataracts. This is when the lenses in the eyes become clouded.  Digestive problems. This may include peptic ulcers, heartburn, and gastroesophageal reflux disease (GERD).  Oral health problems, such as gum disease and tooth loss.  Loss of taste and smell.  Smoking can affect your appearance by causing:  Wrinkles.  Yellow or stained teeth, fingers, and fingernails.  Smoking tobacco can also affect your social life, because: 1. It may be challenging to find places to smoke when away from home. Many workplaces, [employer], hotels, and public places are tobacco-free. 2. Smoking is expensive. This is due to the cost of tobacco and the long-term costs of treating health problems from smoking. 3. Secondhand smoke may affect those around you. Secondhand smoke can cause lung cancer, breathing problems, and heart disease. Children of smokers have a higher risk for: ? Sudden infant death syndrome (SIDS). ? Ear infections. ? Lung infections.  If you currently smoke tobacco, quitting now can help you:  Lead a longer and healthier life.  Look, smell, breathe, and feel better over time.  Save money.  Protect  others from the harms of secondhand smoke.  What actions can I take to prevent health problems? Quit smoking   Do not start smoking. Quit if you already do.  Make a plan to quit smoking and commit to it. Look for programs to help you and ask your health care provider for recommendations and ideas.  Set a date and write down all the reasons you want to quit.  Let your friends and family know you are quitting so they can help and support you. Consider finding friends who also want to quit. It can be easier to quit with someone else, so that you can support each other.  Talk with your health care provider about using nicotine replacement medicines to help you quit, such as gum, lozenges, patches, sprays, or pills.  Do not replace cigarette smoking with electronic cigarettes, which are commonly called e-cigarettes. The safety of e-cigarettes is not known, and some may contain harmful chemicals.  If you try to quit but return to smoking, stay positive. It is common to slip up when you first quit, so take it one day at a time.  Be prepared for cravings. When you feel the urge to smoke, chew gum or suck on hard candy.  Lifestyle  Stay busy and take care of your body.  Drink enough fluid to keep your urine pale yellow.  Get plenty of exercise and eat a healthy diet. This can help prevent weight gain after quitting.  Monitor your eating habits. Quitting smoking can cause you to have a larger appetite than when you smoke.  Find ways to relax. Go out with  friends or family to a movie or a restaurant where people do not smoke.  Ask your health care provider about having regular tests (screenings) to check for cancer. This may include blood tests, imaging tests, and other tests.  Find ways to manage your stress, such as meditation, yoga, or exercise.  Where to find support To get support to quit smoking, consider:  Asking your health care provider for more information and  resources.  Taking classes to learn more about quitting smoking.  Looking for local organizations that offer resources about quitting smoking.  Joining a support group for people who want to quit smoking in your local community.  Calling the smokefree.gov counselor helpline: 1-800-Quit-Now 216-243-3294)  Where to find more information You may find more information about quitting smoking from:  HelpGuide.org: www.helpguide.org  https://hall.com/: smokefree.gov  American Lung Association: www.lung.org  Contact a health care provider if you:  Have problems breathing.  Notice that your lips, nose, or fingers turn blue.  Have chest pain.  Are coughing up blood.  Feel faint or you pass out.  Have other health changes that cause you to worry.  Summary  Smoking tobacco can negatively affect your health, the health of those around you, your finances, and your social life.  Do not start smoking. Quit if you already do. If you need help quitting, ask your health care provider.  Think about joining a support group for people who want to quit smoking in your local community. There are many effective programs that will help you to quit this behavior.  This information is not intended to replace advice given to you by your health care provider. Make sure you discuss any questions you have with your health care provider. Document Revised: 02/16/2019 Document Reviewed: 06/08/2016 Elsevier Patient Education  2020 [employer].

## 2020-03-10 NOTE — Assessment & Plan Note (Signed)
-   I instructed the patient to stop smoking and provided them with smoking cessation materials.  - I informed the patient that smoking puts them at increased risk for cancer, COPD, hypertension, and more.  - Informed the patient to seek help if they begin to have trouble breathing, develop chest pain, start to cough up blood, feel faint, or pass out.  

## 2020-03-10 NOTE — Assessment & Plan Note (Signed)
It is stable on present medication she is in the care of his psychiatrist

## 2020-03-10 NOTE — Progress Notes (Addendum)
Established Patient Office Visit  SUBJECTIVE:  Subjective  Patient ID: Tiffany Mathews, female    DOB: Nov 03, 1960  Age: 59 y.o. MRN: [id number]  CC:  Chief Complaint  Patient presents with  . Generalized Body Aches    Patient states she has had body aches that have been waking her out of her sleep    HPI Tiffany Mathews is a 59 y.o. female presenting today for an initial evaluation of her generalized body aches.  Muscle Pain This is a new problem. The current episode started more than 1 month ago. The problem has been gradually worsening since onset. The pain is present in the neck, upper back, right elbow, left elbow, left shoulder, right shoulder, right hand and left hand. Associated symptoms include abdominal pain (diverticulitis) and constipation (diverticulitis). Past treatments include rest and prescription narcotic. The treatment provided mild relief. She has been behaving normally (On medication for Bipolar Discorder; stable).   She was talking anastrozole since 2019, but her Oncologist told her her to stop taking it several weeks ago due to her Myalgia and will reevaluate her soon.    She is followed by endocrinology and rheumatology.   She would like her influenza vaccine today. She is vaccinated against COVID19 with Moderna in April, May 2021.    Past Medical History:  Diagnosis Date  . Anxiety   . Arthritis   . Bipolar 1 disorder ([place])   . Breast cancer ([place])   . Depression   . Fibromyalgia   . GERD (gastroesophageal reflux disease)   . History of hiatal hernia   . Hydradenitis   . Hyperlipidemia   . Hypertension   . Multiple thyroid nodules    benign  . Sleep apnea    has CPAP, doesn't use    Past Surgical History:  Procedure Laterality Date  . ABLATION  04/2011  . BREAST BIOPSY Right 2009   INVASIVE MAMMARY CARCINOMA, GRADE 1  . BREAST LUMPECTOMY Left 04/19/2018   INVASIVE MAMMARY CARCINOMA, GRADE 1  . CESAREAN SECTION     x3  . CHOLECYSTECTOMY      . COLONOSCOPY WITH PROPOFOL N/A 03/09/2019   Procedure: COLONOSCOPY WITH PROPOFOL;  Surgeon: Lucilla Lame, MD;  Location: [place];  Service: Endoscopy;  Laterality: N/A;  sleep apnea  . GALLBLADDER SURGERY    . LYSIS OF ADHESION  05/27/2016   Procedure: LYSIS OF ADHESION;  Surgeon: Clayburn Pert, MD;  Location: [facility];  Service: General;;  . PARTIAL MASTECTOMY WITH NEEDLE LOCALIZATION Left 04/19/2018   Procedure: PARTIAL MASTECTOMY WITH NEEDLE LOCALIZATION;  Surgeon: Herbert Pun, MD;  Location: [facility];  Service: General;  Laterality: Left;  . ROBOTIC ASSISTED LAPAROSCOPIC CHOLECYSTECTOMY N/A 05/27/2016   Procedure: ROBOTIC ASSISTED LAPAROSCOPIC CHOLECYSTECTOMY;  Surgeon: Clayburn Pert, MD;  Location: [facility];  Service: General;  Laterality: N/A;  . SENTINEL NODE BIOPSY Left 04/19/2018   Procedure: SENTINEL NODE BIOPSY;  Surgeon: Herbert Pun, MD;  Location: [facility];  Service: General;  Laterality: Left;  . SHOULDER ARTHROSCOPY  2000  . TONSILLECTOMY Right 09/19/2019   Procedure: PARTIAL VS TOTAL TONSILLECTOMY;  Surgeon: Carloyn Manner, MD;  Location: [place];  Service: ENT;  Laterality: Right;  . TUBAL LIGATION      Family History  Problem Relation Age of Onset  . Thyroid cancer Mother        dx 36s; currently 76  . Hypertension Mother   . Diabetes Mother   . Breast cancer Maternal Aunt 57  deceased 24s  . Alzheimer's disease Father        deceased 54  . Stroke Father   . Drug abuse Sister        deceased 23  . Diabetes Brother   . Hypertension Brother   . Heart attack Maternal Grandfather   . Diabetes Brother   . Hypertension Brother   . Alcohol abuse Brother   . Schizophrenia Brother   . Stomach cancer Paternal Aunt        age at dx unknown  . Stomach cancer Paternal Uncle        age at dx unknown  . Cancer Other        daughter of sister who died at 55; breast ca in 24s and ovarian cancer at 28; currently 42s     Social History   Socioeconomic History  . Marital status: Divorced    Spouse name: Not on file  . Number of children: Not on file  . Years of education: Not on file  . Highest education level: Not on file  Occupational History  . Not on file  Tobacco Use  . Smoking status: Current Every Day Smoker    Packs/day: 0.50    Years: 28.00    Pack years: 14.00    Types: Cigarettes  . Smokeless tobacco: Never Used  . Tobacco comment: started around 1993  Vaping Use  . Vaping Use: Former  Substance and Sexual Activity  . Alcohol use: No  . Drug use: No  . Sexual activity: Not Currently  Other Topics Concern  . Not on file  Social History Narrative  . Not on file   Social Determinants of Health   Financial Resource Strain:   . Difficulty of Paying Living Expenses: Not on file  Food Insecurity:   . Worried About Charity fundraiser in the Last Year: Not on file  . Ran Out of Food in the Last Year: Not on file  Transportation Needs:   . Lack of Transportation (Medical): Not on file  . Lack of Transportation (Non-Medical): Not on file  Physical Activity:   . Days of Exercise per Week: Not on file  . Minutes of Exercise per Session: Not on file  Stress:   . Feeling of Stress : Not on file  Social Connections:   . Frequency of Communication with Friends and Family: Not on file  . Frequency of Social Gatherings with Friends and Family: Not on file  . Attends Religious Services: Not on file  . Active Member of Clubs or Organizations: Not on file  . Attends Archivist Meetings: Not on file  . Marital Status: Not on file  Intimate Partner Violence:   . Fear of Current or Ex-Partner: Not on file  . Emotionally Abused: Not on file  . Physically Abused: Not on file  . Sexually Abused: Not on file     Current Outpatient Medications:  .  amitriptyline (ELAVIL) 25 MG tablet, , Disp: , Rfl:  .  anastrozole (ARIMIDEX) 1 MG tablet, Take 1 tablet (1 mg total) by mouth  daily., Disp: 90 tablet, Rfl: 3 .  aspirin EC 81 MG tablet, Take 81 mg by mouth daily. , Disp: , Rfl:  .  atorvastatin (LIPITOR) 40 MG tablet, Take 1 tablet (40 mg total) by mouth daily., Disp: 90 tablet, Rfl: 2 .  clonazePAM (KLONOPIN) 0.5 MG tablet, Take 0.5 mg by mouth 2 (two) times daily. , Disp: , Rfl:  .  cyclobenzaprine (FLEXERIL) 10 MG tablet, Take 1 tablet by mouth once daily, Disp: 30 tablet, Rfl: 0 .  cyclobenzaprine (FLEXERIL) 10 MG tablet, Take 1 tablet (10 mg total) by mouth daily., Disp: 30 tablet, Rfl: 0 .  fluticasone (FLONASE) 50 MCG/ACT nasal spray, Place 1 spray into both nostrils 2 (two) times daily., Disp: , Rfl:  .  gabapentin (NEURONTIN) 300 MG capsule, Take 300 mg by mouth 3 (three) times daily. , Disp: , Rfl: 1 .  loratadine (CLARITIN) 10 MG tablet, Take 10 mg by mouth daily as needed for allergies., Disp: , Rfl: 3 .  losartan (COZAAR) 100 MG tablet, Take 100 mg by mouth 2 (two) times daily. , Disp: , Rfl:  .  Multiple Vitamins-Minerals (MULTIVITAMIN WITH MINERALS) tablet, Take 1 tablet by mouth daily., Disp: , Rfl:  .  oxyCODONE (ROXICODONE) 5 MG/5ML solution, Take 7.5 mLs (7.5 mg total) by mouth every 6 (six) hours as needed for severe pain. (Patient not taking: Reported on 03/06/2020), Disp: 240 mL, Rfl: 0 .  pantoprazole (PROTONIX) 40 MG tablet, Take 1 tablet by mouth once daily, Disp: 90 tablet, Rfl: 1 .  traZODone (DESYREL) 100 MG tablet, Take 100 mg by mouth at bedtime as needed (sleep). , Disp: , Rfl: 1 .  triamcinolone cream (KENALOG) 0.1 %, Apply 1 application topically 2 (two) times daily as needed (for foot rash). , Disp: , Rfl:  .  venlafaxine XR (EFFEXOR-XR) 75 MG 24 hr capsule, Take 1 capsule by mouth daily., Disp: , Rfl:  .  VITAMIN D PO, Take by mouth daily., Disp: , Rfl:  .  ziprasidone (GEODON) 40 MG capsule, Take 40 mg by mouth every evening. , Disp: , Rfl:    Allergies  Allergen Reactions  . Lamictal [Lamotrigine] Rash and Other (See Comments)  .  Depakote [Divalproex Sodium] Other (See Comments)    Severe hair loss  . Other Other (See Comments)    Flowers and pollen  . Lithium Other (See Comments)    Unknown; "It just made me feel bad."    ROS Review of Systems  Constitutional: Negative.   HENT: Negative.   Eyes: Negative.   Respiratory: Negative.   Cardiovascular: Negative.   Gastrointestinal: Positive for abdominal pain (diverticulitis) and constipation (diverticulitis).  Endocrine: Negative.   Genitourinary: Negative.   Musculoskeletal: Positive for arthralgias and myalgias.  Skin: Negative.   Allergic/Immunologic: Negative.   Neurological: Negative.   Hematological: Negative.   Psychiatric/Behavioral: Positive for sleep disturbance.  All other systems reviewed and are negative.    OBJECTIVE:    Physical Exam Vitals reviewed.  Constitutional:      Appearance: Normal appearance.  HENT:     Mouth/Throat:     Mouth: Mucous membranes are moist.  Eyes:     Pupils: Pupils are equal, round, and reactive to light.  Neck:     Vascular: No carotid bruit.  Cardiovascular:     Rate and Rhythm: Normal rate and regular rhythm.     Pulses: Normal pulses.     Heart sounds: Normal heart sounds.  Pulmonary:     Effort: Pulmonary effort is normal.     Breath sounds: Normal breath sounds.  Abdominal:     General: Bowel sounds are normal.     Palpations: Abdomen is soft. There is no hepatomegaly, splenomegaly or mass.     Tenderness: There is no abdominal tenderness.     Hernia: No hernia is present.  Musculoskeletal:  General: No tenderness.     Cervical back: Neck supple.     Right lower leg: No edema.     Left lower leg: No edema.  Lymphadenopathy:     Cervical: Cervical adenopathy present.  Skin:    Findings: Rash (B ankles) present.  Neurological:     Mental Status: She is alert and oriented to person, place, and time.     Motor: No weakness.  Psychiatric:        Mood and Affect: Mood and affect  normal.        Behavior: Behavior normal.     BP 127/84   Pulse (!) 103   Ht 5\' 5"  (1.651 m)   Wt 182 lb 4.8 oz (82.7 kg)   BMI 30.34 kg/m  Wt Readings from Last 3 Encounters:  03/10/20 182 lb 4.8 oz (82.7 kg)  03/06/20 182 lb 9.6 oz (82.8 kg)  09/26/19 171 lb 9.6 oz (77.8 kg)    Health Maintenance Due  Topic Date Due  . Hepatitis C Screening  Never done  . HIV Screening  Never done  . TETANUS/TDAP  Never done  . PAP SMEAR-Modifier  Never done  . INFLUENZA VACCINE  01/06/2020    There are no preventive care reminders to display for this patient.  CBC Latest Ref Rng & Units 08/03/2018 07/20/2018 07/06/2018  WBC 4.0 - 10.5 K/uL 8.6 7.6 8.1  Hemoglobin 12.0 - 15.0 g/dL 14.0 14.1 13.2  Hematocrit 36 - 46 % 39.9 40.9 39.3  Platelets 150 - 400 K/uL 362 351 366   CMP Latest Ref Rng & Units 11/21/2018 07/10/2015  Glucose 70 - 99 mg/dL 116(H) 123(H)  BUN 6 - 20 mg/dL 16 14  Creatinine 0.44 - 1.00 mg/dL 0.79 0.83  Sodium 135 - 145 mmol/L 141 141  Potassium 3.5 - 5.1 mmol/L 3.7 3.7  Chloride 98 - 111 mmol/L 106 110  CO2 22 - 32 mmol/L 25 26  Calcium 8.9 - 10.3 mg/dL 9.1 8.8(L)  Total Protein 6.5 - 8.1 g/dL 7.4 -  Total Bilirubin 0.3 - 1.2 mg/dL 0.6 -  Alkaline Phos 38 - 126 U/L 54 -  AST 15 - 41 U/L 16 -  ALT 0 - 44 U/L 16 -    No results found for: TSH Lab Results  Component Value Date   ALBUMIN 4.3 11/21/2018   ANIONGAP 10 11/21/2018   No results found for: CHOL, HDL, LDLCALC, CHOLHDL No results found for: TRIG No results found for: HGBA1C    ASSESSMENT & PLAN:   Problem List Items Addressed This Visit      Endocrine   Thyroid nodule    Thyroid examination is normal she is going to see the endocrinologist for that.  I did a TSH.  Ultrasound of the thyroid was done in the endocrine office.        Other   Bipolar affective disorder ([place])    It is stable on present medication she is in the care of his psychiatrist      Diverticulitis    Diverticulitis is  stable on the present diet.      Dysthymia    Patient has problem at this time we will bipolar disorder and she is being treated by a psychiatrist for that.  She is in remission on the medication and the present time.  She has gained weight and continues to smoke and I told her to walk every day to stop smoking if she can.  Myalgia    Patient complains of myalgia I told her to reduce the Lipitor to 20 mg p.o. daily we will also do the blood test which contain CPK and liver function test.  We will be checking for diabetes.      Need for influenza vaccination - Primary    Patient received influenza vaccine in the office today      Relevant Orders   Flu Vaccine QUAD 36+ mos IM   Annual physical exam    Patient physical exam was done.  Her heart is regular chest is clear abdomen is soft nontender without any hepatosplenomegaly there is no pedal edema no calf tenderness.  She has few petechial rash on the both legs.  So we will check a CBC for that because she has a history of leukemia in the family.Marland Kitchen  She was advised to continue seeing Dr. wall for diverticulosis and see the cancer doctor for the breast cancer.      Tobacco abuse    - I instructed the patient to stop smoking and provided them with smoking cessation materials.  - I informed the patient that smoking puts them at increased risk for cancer, COPD, hypertension, and more.  - Informed the patient to seek help if they begin to have trouble breathing, develop chest pain, start to cough up blood, feel faint, or pass out.        RESOLVED: Malignant neoplasm of upper-outer quadrant of left breast in female, estrogen receptor positive ([place])      No orders of the defined types were placed in this encounter.   Follow-up: Return in about 1 week (around 03/17/2020) for Lab Review, Labs tomorrow 10/04.    Cletis Athens, MD [facility] [address] [place], [place] [address]   By signing my name below, I, [employer], attest that this documentation has been prepared under the direction and in the presence of Dr. Cletis Athens Electronically Signed: Cletis Athens, MD 03/10/20, 6:00 PM  I personally performed the services described in this documentation, which was SCRIBED in my presence. The recorded information has been reviewed and considered accurate. It has been edited as necessary during review. Cletis Athens, MD

## 2020-03-10 NOTE — Assessment & Plan Note (Signed)
Thyroid examination is normal she is going to see the endocrinologist for that.  I did a TSH.  Ultrasound of the thyroid was done in the endocrine office.

## 2020-03-10 NOTE — Assessment & Plan Note (Signed)
Diverticulitis is stable on the present diet.

## 2020-03-11 ENCOUNTER — Ambulatory Visit (INDEPENDENT_AMBULATORY_CARE_PROVIDER_SITE_OTHER): Payer: Medicare Other

## 2020-03-11 DIAGNOSIS — F3160 Bipolar disorder, current episode mixed, unspecified: Secondary | ICD-10-CM | POA: Diagnosis not present

## 2020-03-11 DIAGNOSIS — E785 Hyperlipidemia, unspecified: Secondary | ICD-10-CM

## 2020-03-11 DIAGNOSIS — E041 Nontoxic single thyroid nodule: Secondary | ICD-10-CM

## 2020-03-11 DIAGNOSIS — F4001 Agoraphobia with panic disorder: Secondary | ICD-10-CM

## 2020-03-11 DIAGNOSIS — K449 Diaphragmatic hernia without obstruction or gangrene: Secondary | ICD-10-CM

## 2020-03-12 LAB — COMPLETE METABOLIC PANEL WITH GFR
AG Ratio: 1.8 (calc) (ref 1.0–2.5)
ALT: 15 U/L (ref 6–29)
AST: 17 U/L (ref 10–35)
Albumin: 4.2 g/dL (ref 3.6–5.1)
Alkaline phosphatase (APISO): 57 U/L (ref 37–153)
BUN: 14 mg/dL (ref 7–25)
CO2: 26 mmol/L (ref 20–32)
Calcium: 9.2 mg/dL (ref 8.6–10.4)
Chloride: 105 mmol/L (ref 98–110)
Creat: 0.78 mg/dL (ref 0.50–1.05)
GFR, Est African American: 97 mL/min/{1.73_m2} (ref >=60)
GFR, Est Non African American: 84 mL/min/{1.73_m2} (ref >=60)
Globulin: 2.4 g/dL (calc) (ref 1.9–3.7)
Glucose, Bld: 88 mg/dL (ref 65–99)
Potassium: 4.4 mmol/L (ref 3.5–5.3)
Sodium: 141 mmol/L (ref 135–146)
Total Bilirubin: 0.6 mg/dL (ref 0.2–1.2)
Total Protein: 6.6 g/dL (ref 6.1–8.1)

## 2020-03-12 LAB — CBC WITH DIFFERENTIAL/PLATELET
Absolute Monocytes: 562 cells/uL (ref 200–950)
Basophils Absolute: 54 cells/uL (ref 0–200)
Basophils Relative: 0.7 %
Eosinophils Absolute: 200 cells/uL (ref 15–500)
Eosinophils Relative: 2.6 %
HCT: 41.6 % (ref 35.0–45.0)
Hemoglobin: 14.5 g/dL (ref 11.7–15.5)
Lymphs Abs: 2818 cells/uL (ref 850–3900)
MCH: 32.6 pg (ref 27.0–33.0)
MCHC: 34.9 g/dL (ref 32.0–36.0)
MCV: 93.5 fL (ref 80.0–100.0)
MPV: 8.7 fL (ref 7.5–12.5)
Monocytes Relative: 7.3 %
Neutro Abs: 4066 cells/uL (ref 1500–7800)
Neutrophils Relative %: 52.8 %
Platelets: 352 10*3/uL (ref 140–400)
RBC: 4.45 10*6/uL (ref 3.80–5.10)
RDW: 13.2 % (ref 11.0–15.0)
Total Lymphocyte: 36.6 %
WBC: 7.7 10*3/uL (ref 3.8–10.8)

## 2020-03-12 LAB — LIPID PANEL
Cholesterol: 147 mg/dL (ref <200)
HDL: 35 mg/dL — ABNORMAL LOW (ref >=50)
LDL Cholesterol (Calc): 82 mg/dL (calc)
Non-HDL Cholesterol (Calc): 112 mg/dL (calc) (ref <130)
Total CHOL/HDL Ratio: 4.2 (calc) (ref <5.0)
Triglycerides: 209 mg/dL — ABNORMAL HIGH (ref <150)

## 2020-03-12 LAB — TSH: TSH: 2.48 mIU/L (ref 0.40–4.50)

## 2020-03-16 ENCOUNTER — Other Ambulatory Visit: Payer: Self-pay | Admitting: Oncology

## 2020-03-16 DIAGNOSIS — Z08 Encounter for follow-up examination after completed treatment for malignant neoplasm: Secondary | ICD-10-CM

## 2020-03-17 ENCOUNTER — Encounter: Payer: Self-pay | Admitting: Internal Medicine

## 2020-03-17 ENCOUNTER — Ambulatory Visit (INDEPENDENT_AMBULATORY_CARE_PROVIDER_SITE_OTHER): Payer: Medicare Other | Admitting: Internal Medicine

## 2020-03-17 ENCOUNTER — Other Ambulatory Visit: Payer: Self-pay

## 2020-03-17 VITALS — BP 132/78 | HR 108 | Ht 65.0 in | Wt 184.2 lb

## 2020-03-17 DIAGNOSIS — Z720 Tobacco use: Secondary | ICD-10-CM | POA: Diagnosis not present

## 2020-03-17 DIAGNOSIS — M791 Myalgia, unspecified site: Secondary | ICD-10-CM | POA: Diagnosis not present

## 2020-03-17 DIAGNOSIS — Z712 Person consulting for explanation of examination or test findings: Secondary | ICD-10-CM

## 2020-03-17 DIAGNOSIS — F3177 Bipolar disorder, in partial remission, most recent episode mixed: Secondary | ICD-10-CM

## 2020-03-17 MED ORDER — CYCLOBENZAPRINE HCL 10 MG PO TABS: 10.0000 mg | ORAL_TABLET | Freq: Every day | ORAL | 0 refills | Status: DC

## 2020-03-17 NOTE — Assessment & Plan Note (Signed)
Patient is under the care of a psychiatrist.

## 2020-03-17 NOTE — Progress Notes (Signed)
Established Patient Office Visit  SUBJECTIVE:  Subjective  Patient ID: Tiffany Mathews, female    DOB: 05/23/1961  Age: 60 y.o. MRN: [id number]  CC:  Chief Complaint  Patient presents with  . lab results    HPI Tiffany Mathews is a 59 y.o. female presenting today for a discussion of recent lab results.  Labs were completed on 03/11/2020. CBC was within normal limits. CMP was within normal limits. Lipid panel revealed cholesterol 147, HDL 35, triglycerides 209, LDL 82. TSH was normal at 2.48.   She continues to have body aches despite being off of her anastrozole for several weeks now. She will follow up with her oncologist for this.    Past Medical History:  Diagnosis Date  . Anxiety   . Arthritis   . Bipolar 1 disorder ([place])   . Breast cancer ([place])   . Depression   . Fibromyalgia   . GERD (gastroesophageal reflux disease)   . History of hiatal hernia   . Hydradenitis   . Hyperlipidemia   . Hypertension   . Multiple thyroid nodules    benign  . Sleep apnea    has CPAP, doesn't use    Past Surgical History:  Procedure Laterality Date  . ABLATION  04/2011  . BREAST BIOPSY Right 2009   INVASIVE MAMMARY CARCINOMA, GRADE 1  . BREAST LUMPECTOMY Left 04/19/2018   INVASIVE MAMMARY CARCINOMA, GRADE 1  . CESAREAN SECTION     x3  . CHOLECYSTECTOMY    . COLONOSCOPY WITH PROPOFOL N/A 03/09/2019   Procedure: COLONOSCOPY WITH PROPOFOL;  Surgeon: Lucilla Lame, MD;  Location: [facility];  Service: Endoscopy;  Laterality: N/A;  sleep apnea  . GALLBLADDER SURGERY    . LYSIS OF ADHESION  05/27/2016   Procedure: LYSIS OF ADHESION;  Surgeon: Clayburn Pert, MD;  Location: [facility];  Service: General;;  . PARTIAL MASTECTOMY WITH NEEDLE LOCALIZATION Left 04/19/2018   Procedure: PARTIAL MASTECTOMY WITH NEEDLE LOCALIZATION;  Surgeon: Herbert Pun, MD;  Location: [facility];  Service: General;  Laterality: Left;  . ROBOTIC ASSISTED LAPAROSCOPIC CHOLECYSTECTOMY N/A  05/27/2016   Procedure: ROBOTIC ASSISTED LAPAROSCOPIC CHOLECYSTECTOMY;  Surgeon: Clayburn Pert, MD;  Location: [facility];  Service: General;  Laterality: N/A;  . SENTINEL NODE BIOPSY Left 04/19/2018   Procedure: SENTINEL NODE BIOPSY;  Surgeon: Herbert Pun, MD;  Location: [facility];  Service: General;  Laterality: Left;  . SHOULDER ARTHROSCOPY  2000  . TONSILLECTOMY Right 09/19/2019   Procedure: PARTIAL VS TOTAL TONSILLECTOMY;  Surgeon: Carloyn Manner, MD;  Location: [facility];  Service: ENT;  Laterality: Right;  . TUBAL LIGATION      Family History  Problem Relation Age of Onset  . Thyroid cancer Mother        dx 29s; currently 16  . Hypertension Mother   . Diabetes Mother   . Breast cancer Maternal Aunt 54       deceased 44s  . Alzheimer's disease Father        deceased 23  . Stroke Father   . Drug abuse Sister        deceased 33  . Diabetes Brother   . Hypertension Brother   . Heart attack Maternal Grandfather   . Diabetes Brother   . Hypertension Brother   . Alcohol abuse Brother   . Schizophrenia Brother   . Stomach cancer Paternal Aunt        age at dx unknown  . Stomach cancer Paternal Uncle  age at dx unknown  . Cancer Other        daughter of sister who died at 21; breast ca in 39s and ovarian cancer at 67; currently 48s    Social History   Socioeconomic History  . Marital status: Divorced    Spouse name: Not on file  . Number of children: Not on file  . Years of education: Not on file  . Highest education level: Not on file  Occupational History  . Not on file  Tobacco Use  . Smoking status: Current Every Day Smoker    Packs/day: 0.50    Years: 28.00    Pack years: 14.00    Types: Cigarettes  . Smokeless tobacco: Never Used  . Tobacco comment: started around 1993  Vaping Use  . Vaping Use: Former  Substance and Sexual Activity  . Alcohol use: No  . Drug use: No  . Sexual activity: Not Currently  Other Topics Concern    . Not on file  Social History Narrative  . Not on file   Social Determinants of Health   Financial Resource Strain:   . Difficulty of Paying Living Expenses: Not on file  Food Insecurity:   . Worried About Charity fundraiser in the Last Year: Not on file  . Ran Out of Food in the Last Year: Not on file  Transportation Needs:   . Lack of Transportation (Medical): Not on file  . Lack of Transportation (Non-Medical): Not on file  Physical Activity:   . Days of Exercise per Week: Not on file  . Minutes of Exercise per Session: Not on file  Stress:   . Feeling of Stress : Not on file  Social Connections:   . Frequency of Communication with Friends and Family: Not on file  . Frequency of Social Gatherings with Friends and Family: Not on file  . Attends Religious Services: Not on file  . Active Member of Clubs or Organizations: Not on file  . Attends Archivist Meetings: Not on file  . Marital Status: Not on file  Intimate Partner Violence:   . Fear of Current or Ex-Partner: Not on file  . Emotionally Abused: Not on file  . Physically Abused: Not on file  . Sexually Abused: Not on file     Current Outpatient Medications:  .  amitriptyline (ELAVIL) 25 MG tablet, , Disp: , Rfl:  .  anastrozole (ARIMIDEX) 1 MG tablet, Take 1 tablet by mouth once daily, Disp: 90 tablet, Rfl: 0 .  aspirin EC 81 MG tablet, Take 81 mg by mouth daily. , Disp: , Rfl:  .  atorvastatin (LIPITOR) 40 MG tablet, Take 1 tablet (40 mg total) by mouth daily., Disp: 90 tablet, Rfl: 2 .  clonazePAM (KLONOPIN) 0.5 MG tablet, Take 0.5 mg by mouth 2 (two) times daily. , Disp: , Rfl:  .  cyclobenzaprine (FLEXERIL) 10 MG tablet, Take 1 tablet (10 mg total) by mouth daily., Disp: 30 tablet, Rfl: 0 .  cyclobenzaprine (FLEXERIL) 10 MG tablet, Take 1 tablet (10 mg total) by mouth daily., Disp: 30 tablet, Rfl: 0 .  fluticasone (FLONASE) 50 MCG/ACT nasal spray, Place 1 spray into both nostrils 2 (two) times daily.,  Disp: , Rfl:  .  gabapentin (NEURONTIN) 300 MG capsule, Take 300 mg by mouth 3 (three) times daily. , Disp: , Rfl: 1 .  loratadine (CLARITIN) 10 MG tablet, Take 10 mg by mouth daily as needed for allergies., Disp: , Rfl: 3 .  losartan (COZAAR) 100 MG tablet, Take 100 mg by mouth 2 (two) times daily. , Disp: , Rfl:  .  Multiple Vitamins-Minerals (MULTIVITAMIN WITH MINERALS) tablet, Take 1 tablet by mouth daily., Disp: , Rfl:  .  oxyCODONE (ROXICODONE) 5 MG/5ML solution, Take 7.5 mLs (7.5 mg total) by mouth every 6 (six) hours as needed for severe pain., Disp: 240 mL, Rfl: 0 .  traZODone (DESYREL) 100 MG tablet, Take 100 mg by mouth at bedtime as needed (sleep). , Disp: , Rfl: 1 .  triamcinolone cream (KENALOG) 0.1 %, Apply 1 application topically 2 (two) times daily as needed (for foot rash). , Disp: , Rfl:  .  venlafaxine XR (EFFEXOR-XR) 75 MG 24 hr capsule, Take 1 capsule by mouth daily., Disp: , Rfl:  .  VITAMIN D PO, Take by mouth daily., Disp: , Rfl:  .  ziprasidone (GEODON) 40 MG capsule, Take 40 mg by mouth every evening. , Disp: , Rfl:  .  pantoprazole (PROTONIX) 40 MG tablet, Take 1 tablet (40 mg total) by mouth daily. **PLEASE SCHEDULE FOLLOW UP APPT**, Disp: 90 tablet, Rfl: 1   Allergies  Allergen Reactions  . Lamictal [Lamotrigine] Rash and Other (See Comments)  . Depakote [Divalproex Sodium] Other (See Comments)    Severe hair loss  . Other Other (See Comments)    Flowers and pollen  . Lithium Other (See Comments)    Unknown; "It just made me feel bad."    ROS Review of Systems  Constitutional: Negative.   HENT: Negative.   Eyes: Negative.   Respiratory: Negative.   Cardiovascular: Negative.   Gastrointestinal: Negative.   Endocrine: Negative.   Genitourinary: Negative.   Musculoskeletal: Positive for back pain and myalgias.  Skin: Negative.   Allergic/Immunologic: Negative.   Neurological: Negative.   Hematological: Negative.   Psychiatric/Behavioral: Negative.     All other systems reviewed and are negative.    OBJECTIVE:    Physical Exam Vitals reviewed.  Constitutional:      General: She is not in acute distress.    Appearance: Normal appearance. She is obese.     Interventions: Face mask in place.  Skin:    Coloration: Skin is not ashen or pale.  Neurological:     Mental Status: She is alert and oriented to person, place, and time.  Psychiatric:        Mood and Affect: Mood and affect normal.        Speech: Speech normal.        Behavior: Behavior normal.        Thought Content: Thought content normal.        Cognition and Memory: Memory is not impaired.        Judgment: Judgment normal.     BP 132/78   Pulse (!) 108   Ht 5\' 5"  (1.651 m)   Wt 184 lb 3.2 oz (83.6 kg)   BMI 30.65 kg/m  Wt Readings from Last 3 Encounters:  03/17/20 184 lb 3.2 oz (83.6 kg)  03/10/20 182 lb 4.8 oz (82.7 kg)  03/06/20 182 lb 9.6 oz (82.8 kg)    Health Maintenance Due  Topic Date Due  . Hepatitis C Screening  Never done  . HIV Screening  Never done  . TETANUS/TDAP  Never done  . PAP SMEAR-Modifier  Never done  . INFLUENZA VACCINE  01/06/2020    There are no preventive care reminders to display for this patient.  CBC Latest Ref Rng & Units 03/11/2020 08/03/2018  07/20/2018  WBC 3.8 - 10.8 Thousand/uL 7.7 8.6 7.6  Hemoglobin 11.7 - 15.5 g/dL 14.5 14.0 14.1  Hematocrit 35 - 45 % 41.6 39.9 40.9  Platelets 140 - 400 Thousand/uL 352 362 351   CMP Latest Ref Rng & Units 03/11/2020 11/21/2018 07/10/2015  Glucose 65 - 99 mg/dL 88 116(H) 123(H)  BUN 7 - 25 mg/dL 14 16 14   Creatinine 0.50 - 1.05 mg/dL 0.78 0.79 0.83  Sodium 135 - 146 mmol/L 141 141 141  Potassium 3.5 - 5.3 mmol/L 4.4 3.7 3.7  Chloride 98 - 110 mmol/L 105 106 110  CO2 20 - 32 mmol/L 26 25 26   Calcium 8.6 - 10.4 mg/dL 9.2 9.1 8.8(L)  Total Protein 6.1 - 8.1 g/dL 6.6 7.4 -  Total Bilirubin 0.2 - 1.2 mg/dL 0.6 0.6 -  Alkaline Phos 38 - 126 U/L - 54 -  AST 10 - 35 U/L 17 16 -  ALT 6 -  29 U/L 15 16 -    Lab Results  Component Value Date   TSH 2.48 03/11/2020   Lab Results  Component Value Date   ALBUMIN 4.3 11/21/2018   ANIONGAP 10 11/21/2018   Lab Results  Component Value Date   CHOL 147 03/11/2020   HDL 35 (L) 03/11/2020   LDLCALC 82 03/11/2020   CHOLHDL 4.2 03/11/2020   Lab Results  Component Value Date   TRIG 209 (H) 03/11/2020   No results found for: HGBA1C    ASSESSMENT & PLAN:   Problem List Items Addressed This Visit      Other   Bipolar affective disorder ([place])    Patient is under the care of a psychiatrist.      Myalgia    Patient complains of generalized body ache.  I am going to stop her statin medication and then will substitute another if her body aches show some improvement  off statin      Tobacco abuse    - I instructed the patient to stop smoking and provided them with smoking cessation materials.  - I informed the patient that smoking puts them at increased risk for cancer, COPD, hypertension, and more.  - Informed the patient to seek help if they begin to have trouble breathing, develop chest pain, start to cough up blood, feel faint, or pass out.         Encounter to discuss test results - Primary    Lab tests were discussed with the patient.  Her HDL is low so she was advised to walk on a daily basis and lose weight.  She complains of a lot of fibromyalgia and polyarthralgi going to hold her statin for 2 weeks and if she is not showing any improvement then we will switch her to Crestor.         Meds ordered this encounter  Medications  . cyclobenzaprine (FLEXERIL) 10 MG tablet    Sig: Take 1 tablet (10 mg total) by mouth daily.    Dispense:  30 tablet    Refill:  0    Follow-up: Return in about 3 months (around 06/17/2020).    Cletis Athens, MD [facility] [address], [place], [place] [address]   By signing my name below, I, [employer], attest that this documentation has been prepared under  the direction and in the presence of Dr. Cletis Athens Electronically Signed: Cletis Athens, MD 03/23/20, 3:39 PM   I personally performed the services described in this documentation, which was  SCRIBED in my presence. The recorded information has been reviewed and considered accurate. It has been edited as necessary during review. Cletis Athens, MD

## 2020-03-17 NOTE — Assessment & Plan Note (Signed)
-   I instructed the patient to stop smoking and provided them with smoking cessation materials.  - I informed the patient that smoking puts them at increased risk for cancer, COPD, hypertension, and more.  - Informed the patient to seek help if they begin to have trouble breathing, develop chest pain, start to cough up blood, feel faint, or pass out.  

## 2020-03-17 NOTE — Assessment & Plan Note (Addendum)
Lab tests were discussed with the patient.  Her HDL is low so she was advised to walk on a daily basis and lose weight.  She complains of a lot of fibromyalgia and polyarthralgi going to hold her statin for 2 weeks and if she is not showing any improvement then we will switch her to Crestor.

## 2020-03-17 NOTE — Assessment & Plan Note (Signed)
Patient complains of tiredness fatigue muscle ache.  He also has a history of bipolar disorder.  She is taking Geodon.  Also takes amitriptyline and gabapentin.

## 2020-03-17 NOTE — Assessment & Plan Note (Addendum)
Patient complains of generalized body ache.  I am going to stop her statin medication and then will substitute another if her body aches show some improvement  off statin

## 2020-03-19 ENCOUNTER — Other Ambulatory Visit: Payer: Self-pay

## 2020-03-19 ENCOUNTER — Other Ambulatory Visit: Payer: Self-pay | Admitting: Gastroenterology

## 2020-03-19 MED ORDER — PANTOPRAZOLE SODIUM 40 MG PO TBEC: 40.0000 mg | DELAYED_RELEASE_TABLET | Freq: Every day | ORAL | 1 refills | Status: DC

## 2020-03-27 ENCOUNTER — Telehealth: Payer: Self-pay | Admitting: *Deleted

## 2020-03-27 NOTE — Telephone Encounter (Signed)
I attempted to call patient and had to leave a voice mail message to return my call

## 2020-03-27 NOTE — Telephone Encounter (Signed)
Patient called reporting that she was instructed to call back in a month and reports that she continues to have body aches. She is asking what she is to do about her anastrozole, if it will be changed or not. Please advise

## 2020-03-27 NOTE — Telephone Encounter (Signed)
Patient called back and chooses to stay on the anastrzole

## 2020-03-27 NOTE — Telephone Encounter (Signed)
If she has been off arimidex for more than 3 weeks and bodyaches are still the same, its probably not the drug. But if she wants to try a different drug, can you send exemestane please?

## 2020-03-27 NOTE — Telephone Encounter (Signed)
She chooses to stay on the Anastrozole.

## 2020-04-02 ENCOUNTER — Other Ambulatory Visit: Payer: Self-pay

## 2020-04-02 ENCOUNTER — Ambulatory Visit
Admission: RE | Admit: 2020-04-02 | Discharge: 2020-04-02 | Disposition: A | Discharge status: Home / Self Care | Payer: Medicare Other | Source: Ambulatory Visit | Attending: General Surgery | Admitting: General Surgery

## 2020-04-02 DIAGNOSIS — Z853 Personal history of malignant neoplasm of breast: Secondary | ICD-10-CM

## 2020-04-16 ENCOUNTER — Other Ambulatory Visit: Payer: Self-pay | Admitting: Internal Medicine

## 2020-05-05 DIAGNOSIS — Z23 Encounter for immunization: Secondary | ICD-10-CM

## 2020-05-13 ENCOUNTER — Other Ambulatory Visit: Payer: Self-pay | Admitting: Family Medicine

## 2020-05-13 ENCOUNTER — Other Ambulatory Visit: Payer: Self-pay | Admitting: Internal Medicine

## 2020-05-19 ENCOUNTER — Other Ambulatory Visit: Payer: Self-pay | Admitting: Internal Medicine

## 2020-07-07 ENCOUNTER — Other Ambulatory Visit: Payer: Self-pay | Admitting: Internal Medicine

## 2020-07-17 ENCOUNTER — Other Ambulatory Visit: Payer: Self-pay | Admitting: *Deleted

## 2020-07-17 DIAGNOSIS — Z853 Personal history of malignant neoplasm of breast: Secondary | ICD-10-CM

## 2020-07-17 DIAGNOSIS — Z08 Encounter for follow-up examination after completed treatment for malignant neoplasm: Secondary | ICD-10-CM

## 2020-07-17 MED ORDER — ANASTROZOLE 1 MG PO TABS: 1.0000 mg | ORAL_TABLET | Freq: Every day | ORAL | 0 refills | Status: DC

## 2020-08-04 ENCOUNTER — Other Ambulatory Visit: Payer: Self-pay | Admitting: Internal Medicine

## 2020-08-06 ENCOUNTER — Other Ambulatory Visit: Payer: Self-pay | Admitting: Physical Medicine & Rehabilitation

## 2020-08-06 DIAGNOSIS — M5414 Radiculopathy, thoracic region: Secondary | ICD-10-CM

## 2020-08-06 DIAGNOSIS — M5412 Radiculopathy, cervical region: Secondary | ICD-10-CM

## 2020-08-19 ENCOUNTER — Ambulatory Visit: Payer: Medicare Other

## 2020-08-20 ENCOUNTER — Ambulatory Visit
Admission: RE | Admit: 2020-08-20 | Discharge: 2020-08-20 | Disposition: A | Discharge status: Home / Self Care | Payer: Medicare Other | Source: Ambulatory Visit | Attending: Physical Medicine & Rehabilitation | Admitting: Physical Medicine & Rehabilitation

## 2020-08-20 ENCOUNTER — Other Ambulatory Visit: Payer: Self-pay

## 2020-08-20 DIAGNOSIS — M5412 Radiculopathy, cervical region: Secondary | ICD-10-CM | POA: Diagnosis present

## 2020-08-20 DIAGNOSIS — M5414 Radiculopathy, thoracic region: Secondary | ICD-10-CM | POA: Diagnosis present

## 2020-08-26 ENCOUNTER — Ambulatory Visit
Admission: RE | Admit: 2020-08-26 | Discharge: 2020-08-26 | Disposition: A | Discharge status: Home / Self Care | Payer: Medicare Other | Source: Ambulatory Visit | Attending: Oncology | Admitting: Oncology

## 2020-08-26 ENCOUNTER — Other Ambulatory Visit: Payer: Medicare Other

## 2020-08-26 ENCOUNTER — Other Ambulatory Visit: Payer: Self-pay

## 2020-08-26 DIAGNOSIS — Z5181 Encounter for therapeutic drug level monitoring: Secondary | ICD-10-CM | POA: Diagnosis not present

## 2020-08-26 DIAGNOSIS — Z853 Personal history of malignant neoplasm of breast: Secondary | ICD-10-CM | POA: Diagnosis present

## 2020-08-26 DIAGNOSIS — Z08 Encounter for follow-up examination after completed treatment for malignant neoplasm: Secondary | ICD-10-CM | POA: Insufficient documentation

## 2020-08-26 DIAGNOSIS — Z79811 Long term (current) use of aromatase inhibitors: Secondary | ICD-10-CM | POA: Insufficient documentation

## 2020-09-05 ENCOUNTER — Ambulatory Visit: Payer: Medicare Other | Admitting: Oncology

## 2020-09-10 ENCOUNTER — Ambulatory Visit (INDEPENDENT_AMBULATORY_CARE_PROVIDER_SITE_OTHER): Payer: Medicare Other | Admitting: Gastroenterology

## 2020-09-10 ENCOUNTER — Telehealth: Payer: Self-pay | Admitting: Gastroenterology

## 2020-09-10 ENCOUNTER — Other Ambulatory Visit: Payer: Self-pay

## 2020-09-10 ENCOUNTER — Encounter: Payer: Self-pay | Admitting: Gastroenterology

## 2020-09-10 VITALS — BP 120/74 | HR 97 | Ht 65.0 in | Wt 181.4 lb

## 2020-09-10 DIAGNOSIS — K219 Gastro-esophageal reflux disease without esophagitis: Secondary | ICD-10-CM

## 2020-09-10 MED ORDER — DEXLANSOPRAZOLE 60 MG PO CPDR: 60.0000 mg | DELAYED_RELEASE_CAPSULE | Freq: Every day | ORAL | 0 refills | Status: DC

## 2020-09-10 NOTE — Telephone Encounter (Signed)
pantoprazole (PROTONIX) 40 MG tablet     Patient called LVM that medication that Dr Allen Norris prescribed today was $700 and patient states she cannot afford that.  Patient asked if the medication above can be called in instead.

## 2020-09-10 NOTE — Progress Notes (Signed)
Primary Care Physician: Cletis Athens, MD  Primary Gastroenterologist:  Dr. Lucilla Lame  Chief Complaint  Patient presents with  . Medication Refill    HPI: Tiffany Mathews is a 60 y.o. female here for follow-up and need for a refill of her medication. The patient reports that she is having acid breakthrough 2-3 times a week.  She reports that she takes the pantoprazole in the evening.  Her symptoms are usually prior to her taking her dose.  There is no report of any unexplained weight loss fevers chills nausea or vomiting.  The patient does have some questions about diverticulosis since it was not seen in her last colonoscopy but she reports multiple family members having diverticulosis.  Past Medical History:  Diagnosis Date  . Anxiety   . Arthritis   . Bipolar 1 disorder ([place])   . Breast cancer ([place])   . Depression   . Fibromyalgia   . GERD (gastroesophageal reflux disease)   . History of hiatal hernia   . Hydradenitis   . Hyperlipidemia   . Hypertension   . Multiple thyroid nodules    benign  . Sleep apnea    has CPAP, doesn't use    Current Outpatient Medications  Medication Sig Dispense Refill  . amitriptyline (ELAVIL) 25 MG tablet     . anastrozole (ARIMIDEX) 1 MG tablet Take 1 tablet (1 mg total) by mouth daily. 90 tablet 0  . aspirin EC 81 MG tablet Take 81 mg by mouth daily.     . clonazePAM (KLONOPIN) 0.5 MG tablet Take 0.5 mg by mouth 2 (two) times daily.    Marland Kitchen dexlansoprazole (DEXILANT) 60 MG capsule Take 1 capsule (60 mg total) by mouth daily. 90 capsule 0  . fluticasone (FLONASE) 50 MCG/ACT nasal spray Place 1 spray into both nostrils 2 (two) times daily.    Marland Kitchen gabapentin (NEURONTIN) 300 MG capsule Take 300 mg by mouth 3 (three) times daily.   1  . loratadine (CLARITIN) 10 MG tablet Take 10 mg by mouth daily as needed for allergies.  3  . losartan (COZAAR) 100 MG tablet TAKE 1 TABLET BY MOUTH EVERY 12 HOURS 180 tablet 0  . methocarbamol (ROBAXIN) 500 MG  tablet Take by mouth.    . Multiple Vitamins-Minerals (MULTIVITAMIN WITH MINERALS) tablet Take 1 tablet by mouth daily.    . pantoprazole (PROTONIX) 40 MG tablet Take 1 tablet (40 mg total) by mouth daily. **PLEASE SCHEDULE FOLLOW UP APPT** 90 tablet 1  . traZODone (DESYREL) 100 MG tablet Take 100 mg by mouth at bedtime as needed (sleep).   1  . venlafaxine XR (EFFEXOR-XR) 75 MG 24 hr capsule Take 1 capsule by mouth daily.    Marland Kitchen VITAMIN D PO Take by mouth daily.    . ziprasidone (GEODON) 40 MG capsule Take 40 mg by mouth every evening.     Marland Kitchen atorvastatin (LIPITOR) 40 MG tablet Take 1 tablet (40 mg total) by mouth daily. 90 tablet 2   No current facility-administered medications for this visit.    Allergies as of 09/10/2020 - Review Complete 09/10/2020  Allergen Reaction Noted  . Lamictal [lamotrigine] Rash and Other (See Comments) 03/20/2014  . Depakote [divalproex sodium] Other (See Comments) 03/20/2014  . Other Other (See Comments) 12/19/2019  . Lithium Other (See Comments) 03/20/2014    ROS:  General: Negative for anorexia, weight loss, fever, chills, fatigue, weakness. ENT: Negative for hoarseness, difficulty swallowing , nasal congestion. CV: Negative for chest pain, angina,  palpitations, dyspnea on exertion, peripheral edema.  Respiratory: Negative for dyspnea at rest, dyspnea on exertion, cough, sputum, wheezing.  GI: See history of present illness. GU:  Negative for dysuria, hematuria, urinary incontinence, urinary frequency, nocturnal urination.  Endo: Negative for unusual weight change.    Physical Examination:   BP 120/74   Pulse 97   Ht 5\' 5"  (1.651 m)   Wt 181 lb 6.4 oz (82.3 kg)   BMI 30.19 kg/m   General: Well-nourished, well-developed in no acute distress.  Eyes: No icterus. Conjunctivae pink. Lungs: Clear to auscultation bilaterally. Non-labored. Heart: Regular rate and rhythm, no murmurs rubs or gallops.  Abdomen: Bowel sounds are normal, nontender,  nondistended, no hepatosplenomegaly or masses, no abdominal bruits or hernia , no rebound or guarding.   Extremities: No lower extremity edema. No clubbing or deformities. Neuro: Alert and oriented x 3.  Grossly intact. Skin: Warm and dry, no jaundice.   Psych: Alert and cooperative, normal mood and affect.  Labs:    Imaging Studies: MR CERVICAL SPINE WO CONTRAST  Result Date: 08/20/2020 CLINICAL DATA:  Posterior neck pain EXAM: MRI CERVICAL SPINE WITHOUT CONTRAST TECHNIQUE: Multiplanar, multisequence MR imaging of the cervical spine was performed. No intravenous contrast was administered. COMPARISON:  MRI cervical spine 01/15/2014 FINDINGS: Alignment: Normal Vertebrae: Normal bone marrow. Negative for fracture or mass. Mild bone marrow edema at C6-7 due to disc degeneration. Cord: Normal signal and morphology.  No cord compression. Posterior Fossa, vertebral arteries, paraspinal tissues: Negative Disc levels: C2-3: Small central disc protrusion. Mild facet degeneration left. Negative for stenosis C3-4: Mild facet degeneration.  No significant stenosis C4-5: Small central disc protrusion.  Negative for stenosis C5-6: Mild disc bulging.  Negative for stenosis. C6-7: Small central disc protrusion.  No significant stenosis. C7-T1: Negative for stenosis. Image quality degraded by mild motion. IMPRESSION: Mild degenerative changes cervical spine without significant neural impingement or stenosis. Electronically Signed   By: Franchot Gallo M.D.   On: 08/20/2020 11:55   MR THORACIC SPINE WO CONTRAST  Result Date: 08/20/2020 CLINICAL DATA:  Back pain. EXAM: MRI THORACIC SPINE WITHOUT CONTRAST TECHNIQUE: Multiplanar, multisequence MR imaging of the thoracic spine was performed. No intravenous contrast was administered. COMPARISON:  None. FINDINGS: Alignment:  Normal Vertebrae: Negative for fracture or mass.  Normal bone marrow. Cord:  Normal signal and morphology Paraspinal and other soft tissues: No  paraspinous mass or fluid. Disc levels: T4-5: Mild to moderate left paracentral disc protrusion with cord flattening. Spinal canal adequate in size. T5-6: Small central disc protrusion without stenosis T9-10: Disc degeneration with mild spurring. No significant stenosis T10-11: Mild disc degeneration and spurring without stenosis T11-12: Mild disc and mild facet degeneration without stenosis T12-L1: Mild disc degeneration without stenosis. IMPRESSION: Negative for fracture or mass in the thoracic spine Mild thoracic degenerative changes without significant neural impingement or stenosis. Electronically Signed   By: Franchot Gallo M.D.   On: 08/20/2020 11:45   DG Bone Density  Result Date: 08/26/2020 EXAM: DUAL X-RAY ABSORPTIOMETRY (DXA) FOR BONE MINERAL DENSITY IMPRESSION: Your patient Virdell Hoiland completed a BMD test on 08/26/2020 using the [place] (software version: 14.10) manufactured by [employer]. The following summarizes the results of our evaluation. Technologist: [facility] PATIENT BIOGRAPHICAL: Name: Mairi, Stagliano Patient ID: [id number] Birth Date: Feb 06, 1961 Height: 65.0 in. Gender: Female Exam Date: 08/26/2020 Weight: 181.0 lbs. Indications: History of Breast Cancer, History of Radiation, Postmenopausal Fractures: Treatments: Anastrozole, Calcium, Vitamin D DENSITOMETRY RESULTS: Site  Region     Measured Date Measured Age WHO Classification Young Adult T-score BMD         %Change vs. Previous Significant Change (*) AP Spine L1-L4 08/26/[age] 58.6 Normal 0.4 1.248 g/cm2 -0.5% - AP Spine L1-L4 08/17/2018 56.6 Normal 0.5 1.254 g/cm2 - - DualFemur Neck Left 08/26/2020 58.6 Normal -0.4 0.981 g/cm2 -2.7% - DualFemur Neck Left 08/17/2018 56.6 Normal -0.2 1.008 g/cm2 - - DualFemur Total Mean 08/26/2020 58.6 Normal 0.0 1.006 g/cm2 1.9% Yes DualFemur Total Mean 08/17/2018 56.6 Normal -0.2 0.987 g/cm2 - - ASSESSMENT: The BMD measured at Femur Neck Left is 0.981 g/cm2 with a T-score  of -0.4. This patient is considered normal according to [place]) criteria. The scan quality is good. Compared with prior study, there has been no significant change in the spine. Compared with prior study, there has been significant increase in the total hip. World Pharmacologist [facility]) criteria for post-menopausal, Caucasian Women: Normal:                   T-score at or above -1 SD Osteopenia/low bone mass: T-score between -1 and -2.5 SD Osteoporosis:             T-score at or below -2.5 SD RECOMMENDATIONS: 1. All patients should optimize calcium and vitamin D intake. 2. Consider FDA-approved medical therapies in postmenopausal women and men aged 44 years and older, based on the following: a. A hip or vertebral(clinical or morphometric) fracture b. T-score < -2.5 at the femoral neck or spine after appropriate evaluation to exclude secondary causes c. Low bone mass (T-score between -1.0 and -2.5 at the femoral neck or spine) and a 10-year probability of a hip fracture > 3% or a 10-year probability of a major osteoporosis-related fracture > 20% based on the [place]-adapted WHO algorithm 3. Clinician judgment and/or patient preferences may indicate treatment for people with 10-year fracture probabilities above or below these levels FOLLOW-UP: People with diagnosed cases of osteoporosis or at high risk for fracture should have regular bone mineral density tests. For patients eligible for Medicare, routine testing is allowed once every 2 years. The testing frequency can be increased to one year for patients who have rapidly progressing disease, those who are receiving or discontinuing medical therapy to restore bone mass, or have additional risk factors. I have reviewed this report, and agree with the above findings. [facility] Electronically Signed   By: Lowella Grip III M.D.   On: 08/26/2020 09:13    Assessment and Plan:   Cornie Mccomber is a 60 y.o. y/o female who  comes in today with a history of GERD that is not been helped completely with pantoprazole. The patient has been told to stop the pantoprazole and she will be started on Dexilant.  The patient will take the [place] in the evening.  She will let me know if this is helping her or not.  If it is not then doubling up on the Protonix to twice a day may be another option.  The patient has been explained the plan and agrees with it.     Lucilla Lame, MD. [name]    Note: This dictation was prepared with Dragon dictation along with smaller phrase technology. Any transcriptional errors that result from this process are unintentional.

## 2020-09-11 NOTE — Telephone Encounter (Signed)
Medication was not pantoprazole. She is currently taking that. You switch her to Dexilant. Please let me know which you want to change to.

## 2020-09-11 NOTE — Telephone Encounter (Signed)
Patient called LVM again today with same requests.

## 2020-09-11 NOTE — Telephone Encounter (Signed)
Continue please call this patient in pantoprazole 40 mg twice a day.  I told her that the other medication had gone generic but obviously her insurance company is not covering it.  I told her an alternative was to double up on her medication.

## 2020-09-15 ENCOUNTER — Telehealth: Payer: Self-pay | Admitting: Gastroenterology

## 2020-09-15 ENCOUNTER — Other Ambulatory Visit: Payer: Self-pay

## 2020-09-15 ENCOUNTER — Other Ambulatory Visit: Payer: Self-pay | Admitting: Gastroenterology

## 2020-09-15 MED ORDER — PANTOPRAZOLE SODIUM 40 MG PO TBEC: 40.0000 mg | DELAYED_RELEASE_TABLET | Freq: Two times a day (BID) | ORAL | 3 refills | Status: DC

## 2020-09-15 NOTE — Telephone Encounter (Signed)
Patient can not afford the Dexilant and would like pantoprazole (PROTONIX) 40 MG tablet   [place] on [address]  90 day refill

## 2020-09-15 NOTE — Telephone Encounter (Signed)
Pt notified Pantoprazole has been sent in the [place].

## 2020-09-15 NOTE — Telephone Encounter (Signed)
Pt notified of increasing Pantoprazole to twice daily. New prescription has been sent in.

## 2020-09-18 ENCOUNTER — Encounter: Payer: Self-pay | Admitting: Oncology

## 2020-09-18 ENCOUNTER — Inpatient Hospital Stay: Payer: Medicare Other | Attending: Oncology | Admitting: Oncology

## 2020-09-18 VITALS — BP 126/80 | HR 84 | Temp 96.3°F | Resp 18 | Wt 181.9 lb

## 2020-09-18 DIAGNOSIS — Z803 Family history of malignant neoplasm of breast: Secondary | ICD-10-CM | POA: Diagnosis not present

## 2020-09-18 DIAGNOSIS — Z800 Family history of malignant neoplasm of digestive organs: Secondary | ICD-10-CM | POA: Diagnosis not present

## 2020-09-18 DIAGNOSIS — Z79899 Other long term (current) drug therapy: Secondary | ICD-10-CM | POA: Insufficient documentation

## 2020-09-18 DIAGNOSIS — Z5181 Encounter for therapeutic drug level monitoring: Secondary | ICD-10-CM

## 2020-09-18 DIAGNOSIS — C50412 Malignant neoplasm of upper-outer quadrant of left female breast: Secondary | ICD-10-CM | POA: Insufficient documentation

## 2020-09-18 DIAGNOSIS — Z08 Encounter for follow-up examination after completed treatment for malignant neoplasm: Secondary | ICD-10-CM

## 2020-09-18 DIAGNOSIS — E042 Nontoxic multinodular goiter: Secondary | ICD-10-CM | POA: Insufficient documentation

## 2020-09-18 DIAGNOSIS — F17210 Nicotine dependence, cigarettes, uncomplicated: Secondary | ICD-10-CM | POA: Insufficient documentation

## 2020-09-18 DIAGNOSIS — K449 Diaphragmatic hernia without obstruction or gangrene: Secondary | ICD-10-CM | POA: Diagnosis not present

## 2020-09-18 DIAGNOSIS — E785 Hyperlipidemia, unspecified: Secondary | ICD-10-CM | POA: Diagnosis not present

## 2020-09-18 DIAGNOSIS — K219 Gastro-esophageal reflux disease without esophagitis: Secondary | ICD-10-CM | POA: Insufficient documentation

## 2020-09-18 DIAGNOSIS — M797 Fibromyalgia: Secondary | ICD-10-CM | POA: Insufficient documentation

## 2020-09-18 DIAGNOSIS — Z853 Personal history of malignant neoplasm of breast: Secondary | ICD-10-CM

## 2020-09-18 DIAGNOSIS — Z7982 Long term (current) use of aspirin: Secondary | ICD-10-CM | POA: Diagnosis not present

## 2020-09-18 DIAGNOSIS — G8929 Other chronic pain: Secondary | ICD-10-CM | POA: Insufficient documentation

## 2020-09-18 DIAGNOSIS — G4730 Sleep apnea, unspecified: Secondary | ICD-10-CM | POA: Insufficient documentation

## 2020-09-18 DIAGNOSIS — I10 Essential (primary) hypertension: Secondary | ICD-10-CM | POA: Insufficient documentation

## 2020-09-18 DIAGNOSIS — Z79811 Long term (current) use of aromatase inhibitors: Secondary | ICD-10-CM | POA: Insufficient documentation

## 2020-09-18 DIAGNOSIS — Z170 Estrogen receptor positive status [ER+]: Secondary | ICD-10-CM | POA: Diagnosis not present

## 2020-09-18 NOTE — Progress Notes (Signed)
Hematology/Oncology Consult note [facility]  [phone] [phone]  Patient Care Team: Cletis Athens, MD as PCP - General (Internal Medicine) Noreene Filbert, MD as Radiation Oncologist (Radiation Oncology)   Name of the patient: Tiffany Mathews  [id number]  [birth year]   Date of visit: 09/18/20  Diagnosis- history of stage I ER positive left breast cancer  Chief complaint/ Reason for visit-routine follow-up of breast cancer on Arimidex  Heme/Onc history:  patient is a 60 year old Hispanic female who underwent bilateral screening mammogram in September 2019 which showed a suspicious mass in the left breast 2 o'clock position measuring 0.8 x 0.5 x 0.8 cm in size. Approximately 1.2 cm lateral to this mass were 2-3 tightly clustered hypoechoic nodules together measuring 29 x 0.2 x 0.8 cm. There was also an other hypoechoic area which was suggestive of fibrocystic tissue. Several other tiny hypoechoic masses were noted with prominent ducts which could represent fibrocystic change. DCIS is also a concern. Ultrasound of the left axilla did not reveal any pathologic adenopathy. Patient underwent core biopsy of the dominant mass as well as the cluster of nodules immediately adjacent to it. 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 Biopsy of the second breast mass was negative for atypia and malignancy.  MRI of bilateral breasts on 04/11/2018 showed 0.8 cm biopsy-proven malignancy in the upper outer quadrant of the left breast. Second mass in the upper outer quadrant of the left breast which is known and pathology to be benign and enhances to a lesser degree than does the biopsy malignancy.  Patient underwent lumpectomy and sentinel lymph node biopsy on 04/19/2018. Final pathology showed invasive mammary carcinoma, grade 1, 10 mm. 2 sentinel lymph nodes  were negative for malignancy. Negative margins. ER PR greater than 90% positive. HER-2/neu negative. pT1b pN0  Patient did undergo genetic testing which did not reveal any mutation.   Interval history-patient reports chronic pain in her bilateral shoulders as well as upper back for which she is seeing orthopedics.  She has undergone MRIs and is getting joint injections.  Otherwise tolerating Arimidex well without any significant side effects  ECOG PS- 1 Pain scale- 0   Review of systems- Review of Systems  Constitutional: Negative for chills, fever, malaise/fatigue and weight loss.  HENT: Negative for congestion, ear discharge and nosebleeds.   Eyes: Negative for blurred vision.  Respiratory: Negative for cough, hemoptysis, sputum production, shortness of breath and wheezing.   Cardiovascular: Negative for chest pain, palpitations, orthopnea and claudication.  Gastrointestinal: Negative for abdominal pain, blood in stool, constipation, diarrhea, heartburn, melena, nausea and vomiting.  Genitourinary: Negative for dysuria, flank pain, frequency, hematuria and urgency.  Musculoskeletal: Positive for joint pain. Negative for back pain and myalgias.  Skin: Negative for rash.  Neurological: Negative for dizziness, tingling, focal weakness, seizures, weakness and headaches.  Endo/Heme/Allergies: Does not bruise/bleed easily.  Psychiatric/Behavioral: Negative for depression and suicidal ideas. The patient does not have insomnia.       Allergies  Allergen Reactions  . Lamictal [Lamotrigine] Rash and Other (See Comments)  . Depakote [Divalproex Sodium] Other (See Comments)    Severe hair loss  . Other Other (See Comments)    Flowers and pollen  . Lithium Other (See Comments)    Unknown; "It just made me feel bad."     Past Medical History:  Diagnosis Date  . Anxiety   . Arthritis   .  Bipolar 1 disorder ([place])   . Breast cancer ([place])   . Depression   . Fibromyalgia   . GERD  (gastroesophageal reflux disease)   . History of hiatal hernia   . Hydradenitis   . Hyperlipidemia   . Hypertension   . Multiple thyroid nodules    benign  . Sleep apnea    has CPAP, doesn't use     Past Surgical History:  Procedure Laterality Date  . ABLATION  04/2011  . BREAST BIOPSY Right 2009   INVASIVE MAMMARY CARCINOMA, GRADE 1  . BREAST LUMPECTOMY Left 04/19/2018   INVASIVE MAMMARY CARCINOMA, GRADE 1  . CESAREAN SECTION     x3  . CHOLECYSTECTOMY    . COLONOSCOPY WITH PROPOFOL N/A 03/09/2019   Procedure: COLONOSCOPY WITH PROPOFOL;  Surgeon: Lucilla Lame, MD;  Location: [facility];  Service: Endoscopy;  Laterality: N/A;  sleep apnea  . GALLBLADDER SURGERY    . LYSIS OF ADHESION  05/27/2016   Procedure: LYSIS OF ADHESION;  Surgeon: Clayburn Pert, MD;  Location: [facility];  Service: General;;  . PARTIAL MASTECTOMY WITH NEEDLE LOCALIZATION Left 04/19/2018   Procedure: PARTIAL MASTECTOMY WITH NEEDLE LOCALIZATION;  Surgeon: Herbert Pun, MD;  Location: [facility];  Service: General;  Laterality: Left;  . ROBOTIC ASSISTED LAPAROSCOPIC CHOLECYSTECTOMY N/A 05/27/2016   Procedure: ROBOTIC ASSISTED LAPAROSCOPIC CHOLECYSTECTOMY;  Surgeon: Clayburn Pert, MD;  Location: [facility];  Service: General;  Laterality: N/A;  . SENTINEL NODE BIOPSY Left 04/19/2018   Procedure: SENTINEL NODE BIOPSY;  Surgeon: Herbert Pun, MD;  Location: [facility];  Service: General;  Laterality: Left;  . SHOULDER ARTHROSCOPY  2000  . TONSILLECTOMY Right 09/19/2019   Procedure: PARTIAL VS TOTAL TONSILLECTOMY;  Surgeon: Carloyn Manner, MD;  Location: [facility];  Service: ENT;  Laterality: Right;  . TUBAL LIGATION      Social History   Socioeconomic History  . Marital status: Divorced    Spouse name: Not on file  . Number of children: Not on file  . Years of education: Not on file  . Highest education level: Not on file  Occupational History  . Not on file  Tobacco  Use  . Smoking status: Current Every Day Smoker    Packs/day: 0.50    Years: 28.00    Pack years: 14.00    Types: Cigarettes  . Smokeless tobacco: Never Used  . Tobacco comment: started around 1993  Vaping Use  . Vaping Use: Former  Substance and Sexual Activity  . Alcohol use: No  . Drug use: No  . Sexual activity: Not Currently  Other Topics Concern  . Not on file  Social History Narrative  . Not on file   Social Determinants of Health   Financial Resource Strain: Not on file  Food Insecurity: Not on file  Transportation Needs: Not on file  Physical Activity: Not on file  Stress: Not on file  Social Connections: Not on file  Intimate Partner Violence: Not on file    Family History  Problem Relation Age of Onset  . Thyroid cancer Mother        dx 34s; currently 76  . Hypertension Mother   . Diabetes Mother   . Breast cancer Maternal Aunt 10       deceased 18s  . Alzheimer's disease Father        deceased 66  . Stroke Father   . Drug abuse Sister        deceased 47  . Diabetes  Brother   . Hypertension Brother   . Heart attack Maternal Grandfather   . Diabetes Brother   . Hypertension Brother   . Alcohol abuse Brother   . Schizophrenia Brother   . Stomach cancer Paternal Aunt        age at dx unknown  . Stomach cancer Paternal Uncle        age at dx unknown  . Cancer Other        daughter of sister who died at 80; breast ca in 67s and ovarian cancer at 17; currently 66s     Current Outpatient Medications:  .  amitriptyline (ELAVIL) 25 MG tablet, , Disp: , Rfl:  .  anastrozole (ARIMIDEX) 1 MG tablet, Take 1 tablet (1 mg total) by mouth daily., Disp: 90 tablet, Rfl: 0 .  aspirin EC 81 MG tablet, Take 81 mg by mouth daily. , Disp: , Rfl:  .  atorvastatin (LIPITOR) 40 MG tablet, Take 1 tablet (40 mg total) by mouth daily., Disp: 90 tablet, Rfl: 2 .  clonazePAM (KLONOPIN) 0.5 MG tablet, Take 0.5 mg by mouth 2 (two) times daily., Disp: , Rfl:  .  fluticasone  (FLONASE) 50 MCG/ACT nasal spray, Place 1 spray into both nostrils 2 (two) times daily., Disp: , Rfl:  .  gabapentin (NEURONTIN) 300 MG capsule, Take 300 mg by mouth 3 (three) times daily. , Disp: , Rfl: 1 .  loratadine (CLARITIN) 10 MG tablet, Take 10 mg by mouth daily as needed for allergies., Disp: , Rfl: 3 .  losartan (COZAAR) 100 MG tablet, TAKE 1 TABLET BY MOUTH EVERY 12 HOURS, Disp: 180 tablet, Rfl: 0 .  methocarbamol (ROBAXIN) 500 MG tablet, Take by mouth., Disp: , Rfl:  .  Multiple Vitamins-Minerals (MULTIVITAMIN WITH MINERALS) tablet, Take 1 tablet by mouth daily., Disp: , Rfl:  .  pantoprazole (PROTONIX) 40 MG tablet, Take 1 tablet (40 mg total) by mouth 2 (two) times daily., Disp: 180 tablet, Rfl: 3 .  traZODone (DESYREL) 100 MG tablet, Take 100 mg by mouth at bedtime as needed (sleep). , Disp: , Rfl: 1 .  venlafaxine XR (EFFEXOR-XR) 75 MG 24 hr capsule, Take 1 capsule by mouth daily., Disp: , Rfl:  .  VITAMIN D PO, Take by mouth daily., Disp: , Rfl:  .  ziprasidone (GEODON) 40 MG capsule, Take 40 mg by mouth every evening. , Disp: , Rfl:   Physical exam:  Vitals:   09/18/20 1011  BP: 126/80  Pulse: 84  Resp: 18  Temp: (!) 96.3 F (35.7 C)  TempSrc: Tympanic  SpO2: 98%  Weight: 181 lb 14.4 oz (82.5 kg)   Physical Exam Constitutional:      General: She is not in acute distress. Cardiovascular:     Rate and Rhythm: Normal rate and regular rhythm.     Heart sounds: Normal heart sounds.  Pulmonary:     Effort: Pulmonary effort is normal.     Breath sounds: Normal breath sounds.  Skin:    General: Skin is warm and dry.  Neurological:     Mental Status: She is alert and oriented to person, place, and time.    Breast exam was performed in seated and lying down position. Patient is status post left lumpectomy with a well-healed surgical scar. No evidence of any palpable masses. No evidence of axillary adenopathy. No evidence of any palpable masses or lumps in the right  breast. No evidence of right axillary adenopathy   CMP Latest Ref Rng &  Units 03/11/2020  Glucose 65 - 99 mg/dL 88  BUN 7 - 25 mg/dL 14  Creatinine 0.50 - 1.05 mg/dL 0.78  Sodium 135 - 146 mmol/L 141  Potassium 3.5 - 5.3 mmol/L 4.4  Chloride 98 - 110 mmol/L 105  CO2 20 - 32 mmol/L 26  Calcium 8.6 - 10.4 mg/dL 9.2  Total Protein 6.1 - 8.1 g/dL 6.6  Total Bilirubin 0.2 - 1.2 mg/dL 0.6  Alkaline Phos 38 - 126 U/L -  AST 10 - 35 U/L 17  ALT 6 - 29 U/L 15   CBC Latest Ref Rng & Units 03/11/2020  WBC 3.8 - 10.8 Thousand/uL 7.7  Hemoglobin 11.7 - 15.5 g/dL 14.5  Hematocrit 35.0 - 45.0 % 41.6  Platelets 140 - 400 Thousand/uL 352    No images are attached to the encounter.  MR CERVICAL SPINE WO CONTRAST  Result Date: 08/20/2020 CLINICAL DATA:  Posterior neck pain EXAM: MRI CERVICAL SPINE WITHOUT CONTRAST TECHNIQUE: Multiplanar, multisequence MR imaging of the cervical spine was performed. No intravenous contrast was administered. COMPARISON:  MRI cervical spine 01/15/2014 FINDINGS: Alignment: Normal Vertebrae: Normal bone marrow. Negative for fracture or mass. Mild bone marrow edema at C6-7 due to disc degeneration. Cord: Normal signal and morphology.  No cord compression. Posterior Fossa, vertebral arteries, paraspinal tissues: Negative Disc levels: C2-3: Small central disc protrusion. Mild facet degeneration left. Negative for stenosis C3-4: Mild facet degeneration.  No significant stenosis C4-5: Small central disc protrusion.  Negative for stenosis C5-6: Mild disc bulging.  Negative for stenosis. C6-7: Small central disc protrusion.  No significant stenosis. C7-T1: Negative for stenosis. Image quality degraded by mild motion. IMPRESSION: Mild degenerative changes cervical spine without significant neural impingement or stenosis. Electronically Signed   By: Franchot Gallo M.D.   On: 08/20/2020 11:55   MR THORACIC SPINE WO CONTRAST  Result Date: 08/20/2020 CLINICAL DATA:  Back pain. EXAM: MRI  THORACIC SPINE WITHOUT CONTRAST TECHNIQUE: Multiplanar, multisequence MR imaging of the thoracic spine was performed. No intravenous contrast was administered. COMPARISON:  None. FINDINGS: Alignment:  Normal Vertebrae: Negative for fracture or mass.  Normal bone marrow. Cord:  Normal signal and morphology Paraspinal and other soft tissues: No paraspinous mass or fluid. Disc levels: T4-5: Mild to moderate left paracentral disc protrusion with cord flattening. Spinal canal adequate in size. T5-6: Small central disc protrusion without stenosis T9-10: Disc degeneration with mild spurring. No significant stenosis T10-11: Mild disc degeneration and spurring without stenosis T11-12: Mild disc and mild facet degeneration without stenosis T12-L1: Mild disc degeneration without stenosis. IMPRESSION: Negative for fracture or mass in the thoracic spine Mild thoracic degenerative changes without significant neural impingement or stenosis. Electronically Signed   By: Franchot Gallo M.D.   On: 08/20/2020 11:45   DG Bone Density  Result Date: 08/26/2020 EXAM: DUAL X-RAY ABSORPTIOMETRY (DXA) FOR BONE MINERAL DENSITY IMPRESSION: Your patient Naryah Clenney completed a BMD test on 08/26/2020 using the [place] (software version: 14.10) manufactured by [employer]. The following summarizes the results of our evaluation. Technologist: [facility] PATIENT BIOGRAPHICAL: Name: Charlisha, Market Patient ID: [id number] Birth Date: 10/29/1960 Height: 65.0 in. Gender: Female Exam Date: 08/26/2020 Weight: 181.0 lbs. Indications: History of Breast Cancer, History of Radiation, Postmenopausal Fractures: Treatments: Anastrozole, Calcium, Vitamin D DENSITOMETRY RESULTS: Site      Region     Measured Date Measured Age WHO Classification Young Adult T-score BMD         %Change vs. Previous Significant Change (*)  AP Spine L1-L4 08/26/[age] 58.6 Normal 0.4 1.248 g/cm2 -0.5% - AP Spine L1-L4 08/17/2018 56.6 Normal 0.5 1.254 g/cm2 - -  DualFemur Neck Left 08/26/2020 58.6 Normal -0.4 0.981 g/cm2 -2.7% - DualFemur Neck Left 08/17/2018 56.6 Normal -0.2 1.008 g/cm2 - - DualFemur Total Mean 08/26/2020 58.6 Normal 0.0 1.006 g/cm2 1.9% Yes DualFemur Total Mean 08/17/2018 56.6 Normal -0.2 0.987 g/cm2 - - ASSESSMENT: The BMD measured at Femur Neck Left is 0.981 g/cm2 with a T-score of -0.4. This patient is considered normal according to [facility]) criteria. The scan quality is good. Compared with prior study, there has been no significant change in the spine. Compared with prior study, there has been significant increase in the total hip. World Pharmacologist [facility]) criteria for post-menopausal, Caucasian Women: Normal:                   T-score at or above -1 SD Osteopenia/low bone mass: T-score between -1 and -2.5 SD Osteoporosis:             T-score at or below -2.5 SD RECOMMENDATIONS: 1. All patients should optimize calcium and vitamin D intake. 2. Consider FDA-approved medical therapies in postmenopausal women and men aged 67 years and older, based on the following: a. A hip or vertebral(clinical or morphometric) fracture b. T-score < -2.5 at the femoral neck or spine after appropriate evaluation to exclude secondary causes c. Low bone mass (T-score between -1.0 and -2.5 at the femoral neck or spine) and a 10-year probability of a hip fracture > 3% or a 10-year probability of a major osteoporosis-related fracture > 20% based on the [place]-adapted WHO algorithm 3. Clinician judgment and/or patient preferences may indicate treatment for people with 10-year fracture probabilities above or below these levels FOLLOW-UP: People with diagnosed cases of osteoporosis or at high risk for fracture should have regular bone mineral density tests. For patients eligible for Medicare, routine testing is allowed once every 2 years. The testing frequency can be increased to one year for patients who have rapidly progressing disease, those who are  receiving or discontinuing medical therapy to restore bone mass, or have additional risk factors. I have reviewed this report, and agree with the above findings. [facility] Electronically Signed   By: Lowella Grip III M.D.   On: 08/26/2020 09:13     Assessment and plan- Patient is a 60 y.o. female invasive mammary carcinoma of the left breast stage I ER/PR positive HER-2/neu negative s/p lumpectomy and adjuvant radiation treatment.   She is currently on Arimidex and this is a routine follow-up visit for breast cancer  Patient recently had a bone density scan in March 2022 which was normal.  Clinically patient is doing well with no concerning signs and symptoms of recurrence based on today's exam.  Patient will continue to take Arimidex for 5 years ending in 2024.   Visit Diagnosis 1. Visit for monitoring Arimidex therapy   2. Encounter for follow-up surveillance of breast cancer      Dr. Randa Evens, MD, MPH [facility] [phone] 09/18/2020 10:45 AM

## 2020-09-18 NOTE — Progress Notes (Signed)
Pt in for follow up and test results today.  Denies any concerns.

## 2020-09-24 ENCOUNTER — Ambulatory Visit: Payer: Medicare Other | Admitting: Radiation Oncology

## 2020-10-12 ENCOUNTER — Other Ambulatory Visit: Payer: Self-pay | Admitting: Oncology

## 2020-10-12 DIAGNOSIS — Z08 Encounter for follow-up examination after completed treatment for malignant neoplasm: Secondary | ICD-10-CM

## 2020-10-12 DIAGNOSIS — Z853 Personal history of malignant neoplasm of breast: Secondary | ICD-10-CM

## 2020-10-15 ENCOUNTER — Encounter: Payer: Self-pay | Admitting: Radiation Oncology

## 2020-10-15 ENCOUNTER — Ambulatory Visit
Admission: RE | Admit: 2020-10-15 | Discharge: 2020-10-15 | Disposition: A | Discharge status: Home / Self Care | Payer: Medicare Other | Source: Ambulatory Visit | Attending: Radiation Oncology | Admitting: Radiation Oncology

## 2020-10-15 ENCOUNTER — Other Ambulatory Visit: Payer: Self-pay

## 2020-10-15 VITALS — BP 132/91 | HR 101 | Temp 96.9°F | Resp 16 | Wt 179.9 lb

## 2020-10-15 DIAGNOSIS — Z923 Personal history of irradiation: Secondary | ICD-10-CM | POA: Diagnosis not present

## 2020-10-15 DIAGNOSIS — M47812 Spondylosis without myelopathy or radiculopathy, cervical region: Secondary | ICD-10-CM | POA: Diagnosis not present

## 2020-10-15 DIAGNOSIS — Z170 Estrogen receptor positive status [ER+]: Secondary | ICD-10-CM | POA: Insufficient documentation

## 2020-10-15 DIAGNOSIS — M47814 Spondylosis without myelopathy or radiculopathy, thoracic region: Secondary | ICD-10-CM | POA: Insufficient documentation

## 2020-10-15 DIAGNOSIS — Z79811 Long term (current) use of aromatase inhibitors: Secondary | ICD-10-CM | POA: Insufficient documentation

## 2020-10-15 DIAGNOSIS — C50412 Malignant neoplasm of upper-outer quadrant of left female breast: Secondary | ICD-10-CM | POA: Diagnosis present

## 2020-10-15 NOTE — Progress Notes (Signed)
Radiation Oncology Follow up Note  Name: Tiffany Mathews   Date:   10/15/2020 MRN:  [id number] DOB: June 22, 1960    This 60 y.o. female presents to the clinic today for 2-year follow-up status post whole breast radiation to left breast for stage I ER/PR positive invasive mammary carcinoma.  REFERRING PROVIDER: Cletis Athens, MD  HPI: Patient is a 60 year old female now at 2 years having pleated whole breast radiation to her left breast for stage I ER/PR positive invasive mammary carcinoma seen today in routine follow-up she is doing well from a breast standpoint has multiple issues mostly with her back and disc problems.  She specifically denies breast tenderness cough or bone pain..  She had mammograms back in October which was BI-RADS 2 benign which I have reviewed.  She is also had MRI scans back in March of all thoracic and l cervical spine showing mild degenerative changes without significant impingement or stenosis.  No evidence of malignancy.  She is currently on Arimidex tolerating it well without side effect  COMPLICATIONS OF TREATMENT: none  FOLLOW UP COMPLIANCE: keeps appointments   PHYSICAL EXAM:  BP (!) 132/91 (BP Location: Left Arm, Patient Position: Sitting)   Pulse (!) 101   Temp (!) 96.9 F (36.1 C) (Tympanic)   Resp 16   Wt 179 lb 14.4 oz (81.6 kg)   BMI 29.94 kg/m  Lungs are clear to A&P cardiac examination essentially unremarkable with regular rate and rhythm. No dominant mass or nodularity is noted in either breast in 2 positions examined. Incision is well-healed. No axillary or supraclavicular adenopathy is appreciated. Cosmetic result is excellent.  Some slight loss of volume of the left breast although cosmetic result is still good to excellent.  Well-developed well-nourished patient in NAD. HEENT reveals PERLA, EOMI, discs not visualized.  Oral cavity is clear. No oral mucosal lesions are identified. Neck is clear without evidence of cervical or supraclavicular  adenopathy. Lungs are clear to A&P. Cardiac examination is essentially unremarkable with regular rate and rhythm without murmur rub or thrill. Abdomen is benign with no organomegaly or masses noted. Motor sensory and DTR levels are equal and symmetric in the upper and lower extremities. Cranial nerves II through XII are grossly intact. Proprioception is intact. No peripheral adenopathy or edema is identified. No motor or sensory levels are noted. Crude visual fields are within normal range.  RADIOLOGY RESULTS: Mammograms and MRIs reviewed compatible with above-stated findings  PLAN: Present time patient is doing well with no evidence of disease now 2 years out I am pleased with her overall progress.  I have asked to see her back in 1 year for follow-up.  She continues on Arimidex without side effect.  Patient is to call with any concerns.  I would like to take this opportunity to thank you for allowing me to participate in the care of your patient.Noreene Filbert, MD

## 2020-10-28 ENCOUNTER — Other Ambulatory Visit: Payer: Self-pay | Admitting: Internal Medicine

## 2021-01-05 ENCOUNTER — Other Ambulatory Visit: Payer: Self-pay | Admitting: Oncology

## 2021-01-05 DIAGNOSIS — Z08 Encounter for follow-up examination after completed treatment for malignant neoplasm: Secondary | ICD-10-CM

## 2021-01-17 ENCOUNTER — Other Ambulatory Visit: Payer: Self-pay | Admitting: Internal Medicine

## 2021-01-26 ENCOUNTER — Other Ambulatory Visit: Payer: Self-pay | Admitting: Internal Medicine

## 2021-01-29 ENCOUNTER — Other Ambulatory Visit: Payer: Self-pay | Admitting: Internal Medicine

## 2021-02-16 ENCOUNTER — Other Ambulatory Visit: Payer: Self-pay | Admitting: General Surgery

## 2021-02-16 DIAGNOSIS — Z1231 Encounter for screening mammogram for malignant neoplasm of breast: Secondary | ICD-10-CM

## 2021-03-20 ENCOUNTER — Telehealth: Payer: Medicare Other | Admitting: Oncology

## 2021-04-03 ENCOUNTER — Other Ambulatory Visit: Payer: Self-pay

## 2021-04-03 ENCOUNTER — Ambulatory Visit
Admission: RE | Admit: 2021-04-03 | Discharge: 2021-04-03 | Disposition: A | Discharge status: Home / Self Care | Payer: Medicare Other | Source: Ambulatory Visit | Attending: General Surgery | Admitting: General Surgery

## 2021-04-03 DIAGNOSIS — Z1231 Encounter for screening mammogram for malignant neoplasm of breast: Secondary | ICD-10-CM | POA: Insufficient documentation

## 2021-04-08 ENCOUNTER — Inpatient Hospital Stay: Payer: Medicare Other | Attending: Oncology | Admitting: Oncology

## 2021-04-08 ENCOUNTER — Other Ambulatory Visit: Payer: Self-pay

## 2021-04-08 ENCOUNTER — Encounter: Payer: Self-pay | Admitting: Oncology

## 2021-04-08 VITALS — BP 132/80 | HR 102 | Temp 97.5°F | Resp 18 | Wt 179.5 lb

## 2021-04-08 DIAGNOSIS — I10 Essential (primary) hypertension: Secondary | ICD-10-CM | POA: Insufficient documentation

## 2021-04-08 DIAGNOSIS — C50412 Malignant neoplasm of upper-outer quadrant of left female breast: Secondary | ICD-10-CM | POA: Diagnosis present

## 2021-04-08 DIAGNOSIS — Z803 Family history of malignant neoplasm of breast: Secondary | ICD-10-CM | POA: Diagnosis not present

## 2021-04-08 DIAGNOSIS — E785 Hyperlipidemia, unspecified: Secondary | ICD-10-CM | POA: Diagnosis not present

## 2021-04-08 DIAGNOSIS — Z853 Personal history of malignant neoplasm of breast: Secondary | ICD-10-CM | POA: Diagnosis not present

## 2021-04-08 DIAGNOSIS — E079 Disorder of thyroid, unspecified: Secondary | ICD-10-CM | POA: Insufficient documentation

## 2021-04-08 DIAGNOSIS — K219 Gastro-esophageal reflux disease without esophagitis: Secondary | ICD-10-CM | POA: Diagnosis not present

## 2021-04-08 DIAGNOSIS — Z800 Family history of malignant neoplasm of digestive organs: Secondary | ICD-10-CM | POA: Diagnosis not present

## 2021-04-08 DIAGNOSIS — Z79811 Long term (current) use of aromatase inhibitors: Secondary | ICD-10-CM | POA: Diagnosis not present

## 2021-04-08 DIAGNOSIS — G4730 Sleep apnea, unspecified: Secondary | ICD-10-CM | POA: Diagnosis not present

## 2021-04-08 DIAGNOSIS — Z170 Estrogen receptor positive status [ER+]: Secondary | ICD-10-CM | POA: Insufficient documentation

## 2021-04-08 DIAGNOSIS — Z08 Encounter for follow-up examination after completed treatment for malignant neoplasm: Secondary | ICD-10-CM

## 2021-04-08 DIAGNOSIS — M797 Fibromyalgia: Secondary | ICD-10-CM | POA: Insufficient documentation

## 2021-04-08 DIAGNOSIS — Z5181 Encounter for therapeutic drug level monitoring: Secondary | ICD-10-CM

## 2021-04-08 DIAGNOSIS — Z7982 Long term (current) use of aspirin: Secondary | ICD-10-CM | POA: Diagnosis not present

## 2021-04-08 DIAGNOSIS — Z79899 Other long term (current) drug therapy: Secondary | ICD-10-CM | POA: Diagnosis not present

## 2021-04-08 DIAGNOSIS — F319 Bipolar disorder, unspecified: Secondary | ICD-10-CM | POA: Insufficient documentation

## 2021-04-08 NOTE — Progress Notes (Signed)
Hematology/Oncology Consult note [facility]  [phone] [phone]  Patient Care Team: Cletis Athens, MD as PCP - General (Internal Medicine) Noreene Filbert, MD as Radiation Oncologist (Radiation Oncology)   Name of the patient: Tiffany Mathews  [id number]  1961/02/08   Date of visit: 04/08/21  Diagnosis-  history of stage I ER positive left breast cancer  Chief complaint/ Reason for visit-routine follow-up of breast cancer on Arimidex  Heme/Onc history:  patient is a 60 year old Hispanic female who underwent bilateral screening mammogram in September 2019 which showed a suspicious mass in the left breast 2 o'clock position measuring 0.8 x 0.5 x 0.8 cm in size.  Approximately 1.2 cm lateral to this mass were 2-3 tightly clustered hypoechoic nodules together measuring 29 x 0.2 x 0.8 cm.  There was also an other hypoechoic area which was suggestive of fibrocystic tissue.  Several other tiny hypoechoic masses were noted with prominent ducts which could represent fibrocystic change.  DCIS is also a concern.  Ultrasound of the left axilla did not reveal any pathologic adenopathy.  Patient underwent core biopsy of the dominant mass as well as the cluster of nodules immediately adjacent to it.  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  Biopsy of the second breast mass was negative for atypia and malignancy.     MRI of bilateral breasts on 04/11/2018 showed 0.8 cm biopsy-proven malignancy in the upper outer quadrant of the left breast.  Second mass in the upper outer quadrant of the left breast which is known and pathology to be benign and enhances to a lesser degree than does the biopsy malignancy.   Patient underwent lumpectomy and sentinel lymph node biopsy on 04/19/2018.  Final pathology showed invasive mammary carcinoma, grade 1, 10 mm. 2 sentinel lymph nodes  were negative for malignancy.  Negative margins.  ER PR greater than 90% positive.  HER-2/neu negative.  pT1b pN0   Patient did undergo genetic testing which did not reveal any mutation.  Interval history-tolerating Arimidex well without any significant side effects.  Denies any breast concerns at this time.  Appetite and weight have remained stable.  Denies any new aches and pains anywhere  ECOG PS- 1 Pain scale- 0   Review of systems- Review of Systems  Constitutional:  Negative for chills, fever, malaise/fatigue and weight loss.  HENT:  Negative for congestion, ear discharge and nosebleeds.   Eyes:  Negative for blurred vision.  Respiratory:  Negative for cough, hemoptysis, sputum production, shortness of breath and wheezing.   Cardiovascular:  Negative for chest pain, palpitations, orthopnea and claudication.  Gastrointestinal:  Negative for abdominal pain, blood in stool, constipation, diarrhea, heartburn, melena, nausea and vomiting.  Genitourinary:  Negative for dysuria, flank pain, frequency, hematuria and urgency.  Musculoskeletal:  Negative for back pain, joint pain and myalgias.  Skin:  Negative for rash.  Neurological:  Negative for dizziness, tingling, focal weakness, seizures, weakness and headaches.  Endo/Heme/Allergies:  Does not bruise/bleed easily.  Psychiatric/Behavioral:  Negative for depression and suicidal ideas. The patient does not have insomnia.      Allergies  Allergen Reactions   Lamictal [Lamotrigine] Rash and Other (See Comments)   Depakote [Divalproex Sodium] Other (See Comments)    Severe hair loss   Other Other (See Comments)    Flowers and pollen   Lithium Other (See Comments)    Unknown; "It just made  me feel bad."     Past Medical History:  Diagnosis Date   Anxiety    Arthritis    Bipolar 1 disorder ([place])    Breast cancer ([place])    Depression    Fibromyalgia    GERD (gastroesophageal reflux disease)    History of hiatal hernia     Hydradenitis    Hyperlipidemia    Hypertension    Multiple thyroid nodules    benign   Sleep apnea    has CPAP, doesn't use     Past Surgical History:  Procedure Laterality Date   ABLATION  04/2011   BREAST BIOPSY Right 2009   INVASIVE MAMMARY CARCINOMA, GRADE 1   BREAST LUMPECTOMY Left 04/19/2018   INVASIVE MAMMARY CARCINOMA, GRADE 1   CESAREAN SECTION     x3   CHOLECYSTECTOMY     COLONOSCOPY WITH PROPOFOL N/A 03/09/2019   Procedure: COLONOSCOPY WITH PROPOFOL;  Surgeon: Lucilla Lame, MD;  Location: [place];  Service: Endoscopy;  Laterality: N/A;  sleep apnea   GALLBLADDER SURGERY     LYSIS OF ADHESION  05/27/2016   Procedure: LYSIS OF ADHESION;  Surgeon: Clayburn Pert, MD;  Location: [facility];  Service: General;;   PARTIAL MASTECTOMY WITH NEEDLE LOCALIZATION Left 04/19/2018   Procedure: PARTIAL MASTECTOMY WITH NEEDLE LOCALIZATION;  Surgeon: Herbert Pun, MD;  Location: [facility];  Service: General;  Laterality: Left;   ROBOTIC ASSISTED LAPAROSCOPIC CHOLECYSTECTOMY N/A 05/27/2016   Procedure: ROBOTIC ASSISTED LAPAROSCOPIC CHOLECYSTECTOMY;  Surgeon: Clayburn Pert, MD;  Location: [facility];  Service: General;  Laterality: N/A;   SENTINEL NODE BIOPSY Left 04/19/2018   Procedure: SENTINEL NODE BIOPSY;  Surgeon: Herbert Pun, MD;  Location: [facility];  Service: General;  Laterality: Left;   SHOULDER ARTHROSCOPY  2000   TONSILLECTOMY Right 09/19/2019   Procedure: PARTIAL VS TOTAL TONSILLECTOMY;  Surgeon: Carloyn Manner, MD;  Location: [place];  Service: ENT;  Laterality: Right;   TUBAL LIGATION      Social History   Socioeconomic History   Marital status: Divorced    Spouse name: Not on file   Number of children: Not on file   Years of education: Not on file   Highest education level: Not on file  Occupational History   Not on file  Tobacco Use   Smoking status: Every Day    Packs/day: 0.50    Years: 28.00    Pack years: 14.00     Types: Cigarettes   Smokeless tobacco: Never   Tobacco comments:    started around 1993  Vaping Use   Vaping Use: Former  Substance and Sexual Activity   Alcohol use: No   Drug use: No   Sexual activity: Not Currently  Other Topics Concern   Not on file  Social History Narrative   Not on file   Social Determinants of Health   Financial Resource Strain: Not on file  Food Insecurity: Not on file  Transportation Needs: Not on file  Physical Activity: Not on file  Stress: Not on file  Social Connections: Not on file  Intimate Partner Violence: Not on file    Family History  Problem Relation Age of Onset   Thyroid cancer Mother        dx 84s; currently 43   Hypertension Mother    Diabetes Mother    Breast cancer Maternal Aunt 34       deceased 26s   Alzheimer's disease Father        deceased  29   Stroke Father    Drug abuse Sister        deceased 11   Diabetes Brother    Hypertension Brother    Heart attack Maternal Grandfather    Diabetes Brother    Hypertension Brother    Alcohol abuse Brother    Schizophrenia Brother    Stomach cancer Paternal Aunt        age at dx unknown   Stomach cancer Paternal Uncle        age at dx unknown   Cancer Other        daughter of sister who died at 54; breast ca in 23s and ovarian cancer at 41; currently 19s     Current Outpatient Medications:    amitriptyline (ELAVIL) 25 MG tablet, , Disp: , Rfl:    anastrozole (ARIMIDEX) 1 MG tablet, Take 1 tablet (1 mg total) by mouth daily., Disp: 90 tablet, Rfl: 2   aspirin EC 81 MG tablet, Take 81 mg by mouth daily. , Disp: , Rfl:    atorvastatin (LIPITOR) 40 MG tablet, Take 1 tablet by mouth once daily, Disp: 90 tablet, Rfl: 0   clonazePAM (KLONOPIN) 0.5 MG tablet, Take 0.5 mg by mouth 2 (two) times daily., Disp: , Rfl:    fluticasone (FLONASE) 50 MCG/ACT nasal spray, Place 1 spray into both nostrils 2 (two) times daily., Disp: , Rfl:    gabapentin (NEURONTIN) 300 MG capsule, Take  300 mg by mouth 3 (three) times daily. , Disp: , Rfl: 1   loratadine (CLARITIN) 10 MG tablet, Take 10 mg by mouth daily as needed for allergies., Disp: , Rfl: 3   losartan (COZAAR) 100 MG tablet, TAKE 1 TABLET BY MOUTH EVERY 12 HOURS, Disp: 180 tablet, Rfl: 0   methocarbamol (ROBAXIN) 500 MG tablet, Take by mouth., Disp: , Rfl:    Multiple Vitamins-Minerals (MULTIVITAMIN WITH MINERALS) tablet, Take 1 tablet by mouth daily., Disp: , Rfl:    pantoprazole (PROTONIX) 40 MG tablet, Take 1 tablet (40 mg total) by mouth 2 (two) times daily., Disp: 180 tablet, Rfl: 3   traZODone (DESYREL) 100 MG tablet, Take 100 mg by mouth at bedtime as needed (sleep). , Disp: , Rfl: 1   venlafaxine XR (EFFEXOR-XR) 75 MG 24 hr capsule, Take 1 capsule by mouth daily., Disp: , Rfl:    VITAMIN D PO, Take by mouth daily., Disp: , Rfl:    ziprasidone (GEODON) 40 MG capsule, Take 40 mg by mouth every evening. , Disp: , Rfl:    hydroxychloroquine (PLAQUENIL) 200 MG tablet, Take 200 mg by mouth 2 (two) times daily. (Patient not taking: Reported on 04/08/2021), Disp: , Rfl:   Physical exam:  Vitals:   04/08/21 1131  BP: 132/80  Pulse: (!) 102  Resp: 18  Temp: (!) 97.5 F (36.4 C)  SpO2: 97%  Weight: 179 lb 8 oz (81.4 kg)   Physical Exam Constitutional:      General: She is not in acute distress. Cardiovascular:     Rate and Rhythm: Normal rate and regular rhythm.     Heart sounds: Normal heart sounds.  Pulmonary:     Effort: Pulmonary effort is normal.     Breath sounds: Normal breath sounds.  Abdominal:     General: Bowel sounds are normal.     Palpations: Abdomen is soft.  Skin:    General: Skin is warm and dry.  Neurological:     Mental Status: She is alert and oriented to  person, place, and time.   Breast exam was performed in seated and lying down position. Patient is status post left lumpectomy with a well-healed surgical scar. No evidence of any palpable masses. No evidence of axillary adenopathy. No  evidence of any palpable masses or lumps in the right breast. No evidence of right axillary adenopathy   CMP Latest Ref Rng & Units 03/11/2020  Glucose 65 - 99 mg/dL 88  BUN 7 - 25 mg/dL 14  Creatinine 0.50 - 1.05 mg/dL 0.78  Sodium 135 - 146 mmol/L 141  Potassium 3.5 - 5.3 mmol/L 4.4  Chloride 98 - 110 mmol/L 105  CO2 20 - 32 mmol/L 26  Calcium 8.6 - 10.4 mg/dL 9.2  Total Protein 6.1 - 8.1 g/dL 6.6  Total Bilirubin 0.2 - 1.2 mg/dL 0.6  Alkaline Phos 38 - 126 U/L -  AST 10 - 35 U/L 17  ALT 6 - 29 U/L 15   CBC Latest Ref Rng & Units 03/11/2020  WBC 3.8 - 10.8 Thousand/uL 7.7  Hemoglobin 11.7 - 15.5 g/dL 14.5  Hematocrit 35.0 - 45.0 % 41.6  Platelets 140 - 400 Thousand/uL 352    No images are attached to the encounter.  MM 3D SCREEN BREAST BILATERAL  Result Date: 04/06/2021 CLINICAL DATA:  Screening. EXAM: DIGITAL SCREENING BILATERAL MAMMOGRAM WITH TOMOSYNTHESIS AND CAD TECHNIQUE: Bilateral screening digital craniocaudal and mediolateral oblique mammograms were obtained. Bilateral screening digital breast tomosynthesis was performed. The images were evaluated with computer-aided detection. COMPARISON:  Previous exam(s). ACR Breast Density Category b: There are scattered areas of fibroglandular density. FINDINGS: There are no findings suspicious for malignancy. IMPRESSION: No mammographic evidence of malignancy. A result letter of this screening mammogram will be mailed directly to the patient. RECOMMENDATION: Screening mammogram in one year. (Code:SM-B-01Y) BI-RADS CATEGORY  1: Negative. Electronically Signed   By: Lajean Manes M.D.   On: 04/06/2021 10:29    Assessment and plan- Patient is a 60 y.o. female invasive mammary carcinoma of the left breast stage I ER/PR positive HER-2/neu negative s/p lumpectomy and adjuvant radiation treatment.  She is here for routine follow-up on Arimidex  Clinically patient is doing well with no concerning signs and symptoms of recurrence based on  today's exam.  She is tolerating Arimidex well without any side effects. Recent bone density scan from March 2022 was normal.  Mammogram from 04/03/2021 did not reveal any evidence of malignancy.  I will see her back in 6 months no labs   Visit Diagnosis 1. Encounter for follow-up surveillance of breast cancer   2. Visit for monitoring Arimidex therapy      Dr. Randa Evens, MD, MPH [facility] at [facility] [phone] 04/08/2021 1:25 PM

## 2021-04-09 ENCOUNTER — Ambulatory Visit (INDEPENDENT_AMBULATORY_CARE_PROVIDER_SITE_OTHER): Payer: Medicare Other | Admitting: *Deleted

## 2021-04-09 DIAGNOSIS — Z0000 Encounter for general adult medical examination without abnormal findings: Secondary | ICD-10-CM

## 2021-04-09 MED ORDER — LOSARTAN POTASSIUM 100 MG PO TABS
100.0000 mg | ORAL_TABLET | Freq: Two times a day (BID) | ORAL | 3 refills | Status: DC
Start: 2021-04-09 — End: 2022-03-18

## 2021-04-09 MED ORDER — ATORVASTATIN CALCIUM 40 MG PO TABS: 40.0000 mg | ORAL_TABLET | Freq: Every day | ORAL | 3 refills | Status: DC

## 2021-04-09 NOTE — Progress Notes (Signed)
Subjective:   Tiffany Mathews is a 60 y.o. female who presents for Medicare Annual (Subsequent) preventive examination.  I discussed the limitations of evaluation and management by telemedicine and the availability of in person appointments. Patient expressed understanding and agreed to proceed.   Visit performed using audio  Patient:home Provider:home   Review of Systems    Defer to provider       Objective:    Today's Vitals   04/09/21 0846  PainSc: 8    There is no height or weight on file to calculate BMI.  Advanced Directives 04/08/2021 10/15/2020 09/18/2020 03/06/2020 09/26/2019 09/19/2019 09/03/2019  Does Patient Have a Medical Advance Directive? No No No No No No No  Does patient want to make changes to medical advance directive? - - - No - Patient declined - - -  Would patient like information on creating a medical advance directive? No - Patient declined No - Patient declined - - No - Patient declined No - Patient declined No - Patient declined    Current Medications (verified) Outpatient Encounter Medications as of 04/09/2021  Medication Sig   amitriptyline (ELAVIL) 25 MG tablet    anastrozole (ARIMIDEX) 1 MG tablet Take 1 tablet (1 mg total) by mouth daily.   aspirin EC 81 MG tablet Take 81 mg by mouth daily.    atorvastatin (LIPITOR) 40 MG tablet Take 1 tablet (40 mg total) by mouth daily.   clonazePAM (KLONOPIN) 0.5 MG tablet Take 0.5 mg by mouth 2 (two) times daily.   fluticasone (FLONASE) 50 MCG/ACT nasal spray Place 1 spray into both nostrils 2 (two) times daily.   gabapentin (NEURONTIN) 300 MG capsule Take 300 mg by mouth 3 (three) times daily.    hydroxychloroquine (PLAQUENIL) 200 MG tablet Take 200 mg by mouth 2 (two) times daily.   loratadine (CLARITIN) 10 MG tablet Take 10 mg by mouth daily as needed for allergies.   losartan (COZAAR) 100 MG tablet Take 1 tablet (100 mg total) by mouth every 12 (twelve) hours.   methocarbamol (ROBAXIN) 500 MG tablet Take by  mouth.   methylphenidate (RITALIN) 5 MG tablet Take 5 mg by mouth 2 (two) times daily.   Multiple Vitamins-Minerals (MULTIVITAMIN WITH MINERALS) tablet Take 1 tablet by mouth daily.   pantoprazole (PROTONIX) 40 MG tablet Take 1 tablet (40 mg total) by mouth 2 (two) times daily.   traZODone (DESYREL) 100 MG tablet Take 100 mg by mouth at bedtime as needed (sleep).    venlafaxine XR (EFFEXOR-XR) 75 MG 24 hr capsule Take 1 capsule by mouth daily.   VITAMIN D PO Take by mouth daily.   ziprasidone (GEODON) 40 MG capsule Take 40 mg by mouth every evening.    [DISCONTINUED] atorvastatin (LIPITOR) 40 MG tablet Take 1 tablet by mouth once daily   [DISCONTINUED] losartan (COZAAR) 100 MG tablet TAKE 1 TABLET BY MOUTH EVERY 12 HOURS   No facility-administered encounter medications on file as of 04/09/2021.    Allergies (verified) Lamictal [lamotrigine], Depakote [divalproex sodium], Other, and Lithium   History: Past Medical History:  Diagnosis Date   Anxiety    Arthritis    Bipolar 1 disorder ([place])    Breast cancer ([place])    Depression    Fibromyalgia    GERD (gastroesophageal reflux disease)    Hidradenitis suppurativa    History of hiatal hernia    Hydradenitis    Hyperlipidemia    Hypertension    Multiple thyroid nodules    benign  Sleep apnea    has CPAP, doesn't use   Past Surgical History:  Procedure Laterality Date   ABLATION  04/2011   BREAST BIOPSY Right 2009   INVASIVE MAMMARY CARCINOMA, GRADE 1   BREAST LUMPECTOMY Left 04/19/2018   INVASIVE MAMMARY CARCINOMA, GRADE 1   CESAREAN SECTION     x3   CHOLECYSTECTOMY     COLONOSCOPY WITH PROPOFOL N/A 03/09/2019   Procedure: COLONOSCOPY WITH PROPOFOL;  Surgeon: Lucilla Lame, MD;  Location: [place];  Service: Endoscopy;  Laterality: N/A;  sleep apnea   GALLBLADDER SURGERY     LYSIS OF ADHESION  05/27/2016   Procedure: LYSIS OF ADHESION;  Surgeon: Clayburn Pert, MD;  Location: [facility];  Service: General;;    PARTIAL MASTECTOMY WITH NEEDLE LOCALIZATION Left 04/19/2018   Procedure: PARTIAL MASTECTOMY WITH NEEDLE LOCALIZATION;  Surgeon: Herbert Pun, MD;  Location: [facility];  Service: General;  Laterality: Left;   ROBOTIC ASSISTED LAPAROSCOPIC CHOLECYSTECTOMY N/A 05/27/2016   Procedure: ROBOTIC ASSISTED LAPAROSCOPIC CHOLECYSTECTOMY;  Surgeon: Clayburn Pert, MD;  Location: [facility];  Service: General;  Laterality: N/A;   SENTINEL NODE BIOPSY Left 04/19/2018   Procedure: SENTINEL NODE BIOPSY;  Surgeon: Herbert Pun, MD;  Location: [facility];  Service: General;  Laterality: Left;   SHOULDER ARTHROSCOPY  2000   TONSILLECTOMY Right 09/19/2019   Procedure: PARTIAL VS TOTAL TONSILLECTOMY;  Surgeon: Carloyn Manner, MD;  Location: [place];  Service: ENT;  Laterality: Right;   TUBAL LIGATION     Family History  Problem Relation Age of Onset   Thyroid cancer Mother        dx 31s; currently 11   Hypertension Mother    Diabetes Mother    Breast cancer Maternal Aunt 36       deceased 59s   Alzheimer's disease Father        deceased 47   Stroke Father    Drug abuse Sister        deceased 62   Diabetes Brother    Hypertension Brother    Heart attack Maternal Grandfather    Diabetes Brother    Hypertension Brother    Alcohol abuse Brother    Schizophrenia Brother    Stomach cancer Paternal Aunt        age at dx unknown   Stomach cancer Paternal Uncle        age at dx unknown   Cancer Other        daughter of sister who died at 48; breast ca in 17s and ovarian cancer at 56; currently 49s   Social History   Socioeconomic History   Marital status: Divorced    Spouse name: Not on file   Number of children: Not on file   Years of education: Not on file   Highest education level: Not on file  Occupational History   Not on file  Tobacco Use   Smoking status: Every Day    Packs/day: 0.50    Years: 28.00    Pack years: 14.00    Types: Cigarettes   Smokeless  tobacco: Never   Tobacco comments:    started around 1993  Vaping Use   Vaping Use: Former  Substance and Sexual Activity   Alcohol use: No   Drug use: No   Sexual activity: Not Currently  Other Topics Concern   Not on file  Social History Narrative   Not on file   Social Determinants of Health   Financial Resource Strain: Low  Risk    Difficulty of Paying Living Expenses: Not hard at all  Food Insecurity: No Food Insecurity   Worried About [place] in the Last Year: Never true   Ran Out of Food in the Last Year: Never true  Transportation Needs: No Transportation Needs   Lack of Transportation (Medical): No   Lack of Transportation (Non-Medical): No  Physical Activity: Unknown   Days of Exercise per Week: Not on file   Minutes of Exercise per Session: 30 min  Stress: Stress Concern Present   Feeling of Stress : To some extent  Social Connections: Socially Isolated   Frequency of Communication with Friends and Family: More than three times a week   Frequency of Social Gatherings with Friends and Family: More than three times a week   Attends Religious Services: Never   Marine scientist or Organizations: No   Attends Music therapist: Never   Marital Status: Divorced    Tobacco Counseling Ready to quit: Not Answered Counseling given: Not Answered Tobacco comments: started around 1993   Clinical Intake:  Pre-visit preparation completed: Yes  Pain : 0-10 Pain Score: 8  Pain Type: Chronic pain Pain Location: Back Pain Orientation: Right, Left Pain Descriptors / Indicators: Aching Pain Onset: More than a month ago Pain Frequency: Constant Pain Relieving Factors: Creams, and takig Gabapentin and a muscle relaxer Effect of Pain on Daily Activities: Efffects everything  Pain Relieving Factors: Creams, and takig Gabapentin and a muscle relaxer  Diabetes: No  How often do you need to have someone help you when you read instructions,  pamphlets, or other written materials from your doctor or pharmacy?: 1 - Never What is the last grade level you completed in school?: 12  Diabetic?No  Interpreter Needed?: No      Activities of Daily Living No flowsheet data found.  Patient Care Team: Cletis Athens, MD as PCP - General (Internal Medicine) Noreene Filbert, MD as Radiation Oncologist (Radiation Oncology)  Indicate any recent Medical Services you may have received from other than [facility] providers in the past year (date may be approximate).     Assessment:   This is a routine wellness examination for Emmett.  Hearing/Vision screen No results found.  Dietary issues and exercise activities discussed:     Goals Addressed   None    Depression Screen PHQ 2/9 Scores 04/09/2021  PHQ - 2 Score 0    Fall Risk No flowsheet data found.  FALL RISK PREVENTION PERTAINING TO THE HOME:  Any stairs in or around the home? No  If so, are there any without handrails? No  Home free of loose throw rugs in walkways, pet beds, electrical cords, etc? Yes  Adequate lighting in your home to reduce risk of falls? Yes   ASSISTIVE DEVICES UTILIZED TO PREVENT FALLS:  Life alert? No  Use of a cane, walker or w/c? No  Grab bars in the bathroom? No  Shower chair or bench in shower? No  Elevated toilet seat or a handicapped toilet? No   TIMED UP AND GO:  Was the test performed? No .  Length of time to ambulate-NA  Gait steady and fast without use of assistive device  Cognitive Function:        Immunizations Immunization History  Administered Date(s) Administered   Influenza,inj,Quad PF,6+ Mos 05/05/2020   Moderna Sars-Covid-2 Vaccination 09/06/2019, 10/06/2019    TDAP status: Due, Education has been provided regarding the importance of this vaccine. Advised  may receive this vaccine at local pharmacy or Health Dept. Aware to provide a copy of the vaccination record if obtained from local pharmacy or Health Dept.  Verbalized acceptance and understanding. Patient wishes to receive when she comes for labs next week   Flu Vaccine status: Due, Education has been provided regarding the importance of this vaccine. Advised may receive this vaccine at local pharmacy or Health Dept. Aware to provide a copy of the vaccination record if obtained from local pharmacy or Health Dept. Verbalized acceptance and understanding. Patient wishes to receive when she comes for labs   Pneumococcal vaccine status: Declined,  Education has been provided regarding the importance of this vaccine but patient still declined. Advised may receive this vaccine at local pharmacy or Health Dept. Aware to provide a copy of the vaccination record if obtained from local pharmacy or Health Dept. Verbalized acceptance and understanding.   Covid-19 vaccine status: Declined, Education has been provided regarding the importance of this vaccine but patient still declined. Advised may receive this vaccine at local pharmacy or Health Dept.or vaccine clinic. Aware to provide a copy of the vaccination record if obtained from local pharmacy or Health Dept. Verbalized acceptance and understanding.  Qualifies for Shingles Vaccine? No   Zostavax completed No   Shingrix Completed?: No.    Education has been provided regarding the importance of this vaccine. Patient has been advised to call insurance company to determine out of pocket expense if they have not yet received this vaccine. Advised may also receive vaccine at local pharmacy or Health Dept. Verbalized acceptance and understanding.  Screening Tests Health Maintenance  Topic Date Due   HIV Screening  Never done   Hepatitis C Screening  Never done   TETANUS/TDAP  Never done   INFLUENZA VACCINE  01/05/2021   PAP SMEAR-Modifier  05/25/2021   COVID-19 Vaccine (3 - Moderna risk series) 04/25/2021 (Originally 11/03/2019)   Zoster Vaccines- Shingrix (1 of 2) 07/10/2021 (Originally 01/05/1981)    Pneumococcal Vaccine 67-73 Years old (1 - PCV) 04/09/2022 (Originally [birth year]/1968)   MAMMOGRAM  04/04/2023   COLONOSCOPY (Pts 45-52yrs Insurance coverage will need to be confirmed)  03/08/2029   HPV VACCINES  Aged Out    Health Maintenance  Health Maintenance Due  Topic Date Due   HIV Screening  Never done   Hepatitis C Screening  Never done   TETANUS/TDAP  Never done   INFLUENZA VACCINE  01/05/2021   PAP SMEAR-Modifier  05/25/2021    Colorectal cancer screening: Type of screening: Colonoscopy. Completed 03/09/1919. Repeat every 5 years  Mammogram status: Completed 04/03/2021. Repeat every year  Bone Density status: Completed 08/26/2020. Results reflect: Bone density results: OSTEOPENIA. Repeat every 3 years.  Lung Cancer Screening: (Low Dose CT Chest recommended if Age 77-80 years, 30 pack-year currently smoking OR have quit w/in 15years.) does qualify.   Lung Cancer Screening Referral: Patient declines at this time   Additional Screening:  Hepatitis C Screening: does qualify; Completed No Will collect with upcoming labs   Vision Screening: Recommended annual ophthalmology exams for early detection of glaucoma and other disorders of the eye. Is the patient up to date with their annual eye exam?  Yes  Who is the provider or what is the name of the office in which the patient attends annual eye exams? [facility] If pt is not established with a provider, would they like to be referred to a provider to establish care?  Patient already established .   Dental Screening: Recommended  annual dental exams for proper oral hygiene  Community Resource Referral / Chronic Care Management: CRR required this visit?  No   CCM required this visit?  No      Plan:     I have personally reviewed and noted the following in the patient's chart:   Medical and social history Use of alcohol, tobacco or illicit drugs  Current medications and supplements including opioid prescriptions.  Functional  ability and status Nutritional status Physical activity Advanced directives List of other physicians Hospitalizations, surgeries, and ER visits in previous 12 months Vitals Screenings to include cognitive, depression, and falls Referrals and appointments  In addition, I have reviewed and discussed with patient certain preventive protocols, quality metrics, and best practice recommendations. A written personalized care plan for preventive services as well as general preventive health recommendations were provided to patient.     Lacretia Nicks, [place]   04/09/2021   Nurse Notes:  Ms. Nease , Thank you for taking time to come for your Medicare Wellness Visit. I appreciate your ongoing commitment to your health goals. Please review the following plan we discussed and let me know if I can assist you in the future.   These are the goals we discussed:  Goals   None     This is a list of the screening recommended for you and due dates:  Health Maintenance  Topic Date Due   HIV Screening  Never done   Hepatitis C Screening: USPSTF Recommendation to screen - Ages 37-79 yo.  Never done   Tetanus Vaccine  Never done   Flu Shot  01/05/2021   Pap Smear  05/25/2021   COVID-19 Vaccine (3 - Moderna risk series) 04/25/2021*   Zoster (Shingles) Vaccine (1 of 2) 07/10/2021*   Pneumococcal Vaccination (1 - PCV) 04/09/2022*   Mammogram  04/04/2023   Colon Cancer Screening  03/08/2029   HPV Vaccine  Aged Out  *Topic was postponed. The date shown is not the original due date.     Time spent 40 min

## 2021-04-10 NOTE — Progress Notes (Signed)
I have reviewed this visit and agree with the documentation.   

## 2021-04-14 ENCOUNTER — Ambulatory Visit (INDEPENDENT_AMBULATORY_CARE_PROVIDER_SITE_OTHER): Payer: Medicare Other | Admitting: *Deleted

## 2021-04-14 ENCOUNTER — Other Ambulatory Visit: Payer: Self-pay

## 2021-04-14 DIAGNOSIS — E538 Deficiency of other specified B group vitamins: Secondary | ICD-10-CM

## 2021-04-14 DIAGNOSIS — Z1159 Encounter for screening for other viral diseases: Secondary | ICD-10-CM

## 2021-04-14 DIAGNOSIS — R5383 Other fatigue: Secondary | ICD-10-CM

## 2021-04-14 DIAGNOSIS — F3160 Bipolar disorder, current episode mixed, unspecified: Secondary | ICD-10-CM

## 2021-04-14 DIAGNOSIS — Z114 Encounter for screening for human immunodeficiency virus [HIV]: Secondary | ICD-10-CM

## 2021-04-14 DIAGNOSIS — Z23 Encounter for immunization: Secondary | ICD-10-CM

## 2021-04-14 DIAGNOSIS — Z0000 Encounter for general adult medical examination without abnormal findings: Secondary | ICD-10-CM

## 2021-04-14 DIAGNOSIS — Z1321 Encounter for screening for nutritional disorder: Secondary | ICD-10-CM

## 2021-04-14 DIAGNOSIS — F3177 Bipolar disorder, in partial remission, most recent episode mixed: Secondary | ICD-10-CM

## 2021-04-14 DIAGNOSIS — R739 Hyperglycemia, unspecified: Secondary | ICD-10-CM

## 2021-04-15 LAB — CBC WITH DIFFERENTIAL/PLATELET
Absolute Monocytes: 490 cells/uL (ref 200–950)
Basophils Absolute: 71 cells/uL (ref 0–200)
Basophils Relative: 0.9 %
Eosinophils Absolute: 150 cells/uL (ref 15–500)
Eosinophils Relative: 1.9 %
HCT: 42.2 % (ref 35.0–45.0)
Hemoglobin: 14.4 g/dL (ref 11.7–15.5)
Lymphs Abs: 3334 cells/uL (ref 850–3900)
MCH: 32.5 pg (ref 27.0–33.0)
MCHC: 34.1 g/dL (ref 32.0–36.0)
MCV: 95.3 fL (ref 80.0–100.0)
MPV: 8.7 fL (ref 7.5–12.5)
Monocytes Relative: 6.2 %
Neutro Abs: 3855 cells/uL (ref 1500–7800)
Neutrophils Relative %: 48.8 %
Platelets: 347 10*3/uL (ref 140–400)
RBC: 4.43 10*6/uL (ref 3.80–5.10)
RDW: 12.5 % (ref 11.0–15.0)
Total Lymphocyte: 42.2 %
WBC: 7.9 10*3/uL (ref 3.8–10.8)

## 2021-04-15 LAB — B12 AND FOLATE PANEL
Folate: >24 ng/mL
Vitamin B-12: 849 pg/mL (ref 200–1100)

## 2021-04-15 LAB — COMPLETE METABOLIC PANEL WITH GFR
AG Ratio: 1.8 (calc) (ref 1.0–2.5)
ALT: 13 U/L (ref 6–29)
AST: 16 U/L (ref 10–35)
Albumin: 4.2 g/dL (ref 3.6–5.1)
Alkaline phosphatase (APISO): 51 U/L (ref 37–153)
BUN: 11 mg/dL (ref 7–25)
CO2: 22 mmol/L (ref 20–32)
Calcium: 9.4 mg/dL (ref 8.6–10.4)
Chloride: 107 mmol/L (ref 98–110)
Creat: 0.84 mg/dL (ref 0.50–1.03)
Globulin: 2.3 g/dL (calc) (ref 1.9–3.7)
Glucose, Bld: 104 mg/dL — ABNORMAL HIGH (ref 65–99)
Potassium: 4.4 mmol/L (ref 3.5–5.3)
Sodium: 143 mmol/L (ref 135–146)
Total Bilirubin: 0.4 mg/dL (ref 0.2–1.2)
Total Protein: 6.5 g/dL (ref 6.1–8.1)
eGFR: 80 mL/min/{1.73_m2} (ref >=60)

## 2021-04-15 LAB — LIPID PANEL
Cholesterol: 147 mg/dL (ref <200)
HDL: 37 mg/dL — ABNORMAL LOW (ref >=50)
LDL Cholesterol (Calc): 80 mg/dL (calc)
Non-HDL Cholesterol (Calc): 110 mg/dL (calc) (ref <130)
Total CHOL/HDL Ratio: 4 (calc) (ref <5.0)
Triglycerides: 205 mg/dL — ABNORMAL HIGH (ref <150)

## 2021-04-15 LAB — HEPATITIS C ANTIBODY
Hepatitis C Ab: NONREACTIVE
SIGNAL TO CUT-OFF: 0.09 (ref <1.00)

## 2021-04-15 LAB — HEMOGLOBIN A1C
Hgb A1c MFr Bld: 5.4 % of total Hgb (ref <5.7)
Mean Plasma Glucose: 108 mg/dL
eAG (mmol/L): 6 mmol/L

## 2021-04-15 LAB — HIV ANTIBODY (ROUTINE TESTING W REFLEX): HIV 1&2 Ab, 4th Generation: NONREACTIVE

## 2021-04-15 LAB — TSH: TSH: 2.44 mIU/L (ref 0.40–4.50)

## 2021-09-24 ENCOUNTER — Other Ambulatory Visit: Payer: Self-pay | Admitting: *Deleted

## 2021-09-24 DIAGNOSIS — Z08 Encounter for follow-up examination after completed treatment for malignant neoplasm: Secondary | ICD-10-CM

## 2021-09-24 MED ORDER — ANASTROZOLE 1 MG PO TABS: 1.0000 mg | ORAL_TABLET | Freq: Every day | ORAL | 2 refills | Status: DC

## 2021-10-06 ENCOUNTER — Encounter: Payer: Self-pay | Admitting: Oncology

## 2021-10-06 ENCOUNTER — Inpatient Hospital Stay: Payer: Medicare Other | Attending: Oncology | Admitting: Oncology

## 2021-10-06 VITALS — BP 151/77 | HR 87 | Temp 97.8°F | Resp 18 | Wt 185.9 lb

## 2021-10-06 DIAGNOSIS — Z79811 Long term (current) use of aromatase inhibitors: Secondary | ICD-10-CM | POA: Diagnosis not present

## 2021-10-06 DIAGNOSIS — Z7982 Long term (current) use of aspirin: Secondary | ICD-10-CM | POA: Diagnosis not present

## 2021-10-06 DIAGNOSIS — Z170 Estrogen receptor positive status [ER+]: Secondary | ICD-10-CM | POA: Insufficient documentation

## 2021-10-06 DIAGNOSIS — Z79899 Other long term (current) drug therapy: Secondary | ICD-10-CM | POA: Insufficient documentation

## 2021-10-06 DIAGNOSIS — F17210 Nicotine dependence, cigarettes, uncomplicated: Secondary | ICD-10-CM | POA: Insufficient documentation

## 2021-10-06 DIAGNOSIS — Z5181 Encounter for therapeutic drug level monitoring: Secondary | ICD-10-CM

## 2021-10-06 DIAGNOSIS — C50412 Malignant neoplasm of upper-outer quadrant of left female breast: Secondary | ICD-10-CM | POA: Insufficient documentation

## 2021-10-06 DIAGNOSIS — Z803 Family history of malignant neoplasm of breast: Secondary | ICD-10-CM | POA: Insufficient documentation

## 2021-10-06 DIAGNOSIS — Z853 Personal history of malignant neoplasm of breast: Secondary | ICD-10-CM | POA: Diagnosis not present

## 2021-10-06 DIAGNOSIS — Z08 Encounter for follow-up examination after completed treatment for malignant neoplasm: Secondary | ICD-10-CM

## 2021-10-06 NOTE — Progress Notes (Signed)
? ? ? ?Hematology/Oncology Consult note ?[place]  ?[phone] [phone] ? ?Patient Care Team: ?Tiffany Athens, MD as PCP - General (Internal Medicine) ?Tiffany Filbert, MD as Radiation Oncologist (Radiation Oncology)  ? ?Name of the patient: Tiffany Mathews  ?[id number]  ?12-18-1960  ? ?Date of visit: 10/06/21 ? ?Diagnosis- history of stage I ER positive left breast cancer ? ?Chief complaint/ Reason for visit-routine follow-up of breast cancer on Arimidex ? ?Heme/Onc history: patient is a 61 year old Hispanic female who underwent bilateral screening mammogram in September 2019 which showed a suspicious mass in the left breast 2 o'clock position measuring 0.8 x 0.5 x 0.8 cm in size.  Approximately 1.2 cm lateral to this mass were 2-3 tightly clustered hypoechoic nodules together measuring 29 x 0.2 x 0.8 cm.  There was also an other hypoechoic area which was suggestive of fibrocystic tissue.  Several other tiny hypoechoic masses were noted with prominent ducts which could represent fibrocystic change.  DCIS is also a concern.  Ultrasound of the left axilla did not reveal any pathologic adenopathy.  Patient underwent core biopsy of the dominant mass as well as the cluster of nodules immediately adjacent to it.  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  Biopsy of the second breast mass was negative for atypia and malignancy.   ?  ?MRI of bilateral breasts on 04/11/2018 showed 0.8 cm biopsy-proven malignancy in the upper outer quadrant of the left breast.  Second mass in the upper outer quadrant of the left breast which is known and pathology to be benign and enhances to a lesser degree than does the biopsy malignancy. ?  ?Patient underwent lumpectomy and sentinel lymph node biopsy on 04/19/2018.  Final pathology showed invasive mammary carcinoma, grade 1, 10 mm. 2 sentinel lymph nodes  were negative for malignancy.  Negative margins.  ER PR greater than 90% positive.  HER-2/neu negative.  pT1b pN0.  Patient completed adjuvant radiation treatment and started Arimidex sometime in February 2020. ?  ?Patient did undergo genetic testing which did not reveal any mutation. ?  ? ?Interval history-she is presently doing well on Arimidex.  Denies any significant side effects.  She is also taking her calcium and vitamin D.  Denies any breast concerns. ? ?ECOG PS- 1 ?Pain scale- 0 ? ? ?Review of systems- Review of Systems  ?Constitutional:  Negative for chills, fever, malaise/fatigue and weight loss.  ?HENT:  Negative for congestion, ear discharge and nosebleeds.   ?Eyes:  Negative for blurred vision.  ?Respiratory:  Negative for cough, hemoptysis, sputum production, shortness of breath and wheezing.   ?Cardiovascular:  Negative for chest pain, palpitations, orthopnea and claudication.  ?Gastrointestinal:  Negative for abdominal pain, blood in stool, constipation, diarrhea, heartburn, melena, nausea and vomiting.  ?Genitourinary:  Negative for dysuria, flank pain, frequency, hematuria and urgency.  ?Musculoskeletal:  Negative for back pain, joint pain and myalgias.  ?Skin:  Negative for rash.  ?Neurological:  Negative for dizziness, tingling, focal weakness, seizures, weakness and headaches.  ?Endo/Heme/Allergies:  Does not bruise/bleed easily.  ?Psychiatric/Behavioral:  Negative for depression and suicidal ideas. The patient does not have insomnia.    ? ? ? ?Allergies  ?Allergen Reactions  ? Lamictal [Lamotrigine] Rash and Other (See Comments)  ? Depakote [Divalproex Sodium] Other (See Comments)  ?  Severe hair loss  ? Other Other (See Comments)  ?  Flowers and pollen  ?  Lithium Other (See Comments)  ?  Unknown; "It just made me feel bad."  ? ? ? ?Past Medical History:  ?Diagnosis Date  ? Anxiety   ? Arthritis   ? Bipolar 1 disorder (Tiffany Mathews)   ? Breast cancer (Tiffany Mathews)   ? Depression   ? Fibromyalgia   ? GERD  (gastroesophageal reflux disease)   ? Hidradenitis suppurativa   ? History of hiatal hernia   ? Hydradenitis   ? Hyperlipidemia   ? Hypertension   ? Multiple thyroid nodules   ? benign  ? Sleep apnea   ? has CPAP, doesn't use  ? ? ? ?Past Surgical History:  ?Procedure Laterality Date  ? ABLATION  04/2011  ? BREAST BIOPSY Right 2009  ? INVASIVE MAMMARY CARCINOMA, GRADE 1  ? BREAST LUMPECTOMY Left 04/19/2018  ? INVASIVE MAMMARY CARCINOMA, GRADE 1  ? CESAREAN SECTION    ? x3  ? CHOLECYSTECTOMY    ? COLONOSCOPY WITH PROPOFOL N/A 03/09/2019  ? Procedure: COLONOSCOPY WITH PROPOFOL;  Surgeon: Tiffany Lame, MD;  Location: [place];  Service: Endoscopy;  Laterality: N/A;  sleep apnea  ? GALLBLADDER SURGERY    ? LYSIS OF ADHESION  05/27/2016  ? Procedure: LYSIS OF ADHESION;  Surgeon: Tiffany Pert, MD;  Location: [facility];  Service: General;;  ? PARTIAL MASTECTOMY WITH NEEDLE LOCALIZATION Left 04/19/2018  ? Procedure: PARTIAL MASTECTOMY WITH NEEDLE LOCALIZATION;  Surgeon: Tiffany Pun, MD;  Location: [facility];  Service: General;  Laterality: Left;  ? ROBOTIC ASSISTED LAPAROSCOPIC CHOLECYSTECTOMY N/A 05/27/2016  ? Procedure: ROBOTIC ASSISTED LAPAROSCOPIC CHOLECYSTECTOMY;  Surgeon: Tiffany Pert, MD;  Location: [facility];  Service: General;  Laterality: N/A;  ? SENTINEL NODE BIOPSY Left 04/19/2018  ? Procedure: SENTINEL NODE BIOPSY;  Surgeon: Tiffany Pun, MD;  Location: [facility];  Service: General;  Laterality: Left;  ? SHOULDER ARTHROSCOPY  2000  ? TONSILLECTOMY Right 09/19/2019  ? Procedure: PARTIAL VS TOTAL TONSILLECTOMY;  Surgeon: Tiffany Manner, MD;  Location: [place];  Service: ENT;  Laterality: Right;  ? TUBAL LIGATION    ? ? ?Social History  ? ?Socioeconomic History  ? Marital status: Divorced  ?  Spouse name: Not on file  ? Number of children: Not on file  ? Years of education: Not on file  ? Highest education level: Not on file  ?Occupational History  ? Not on file   ?Tobacco Use  ? Smoking status: Every Day  ?  Packs/day: 0.50  ?  Years: 28.00  ?  Pack years: 14.00  ?  Types: Cigarettes  ? Smokeless tobacco: Never  ? Tobacco comments:  ?  started around 1993  ?Vaping Use  ? Vaping Use: Former  ?Substance and Sexual Activity  ? Alcohol use: No  ? Drug use: No  ? Sexual activity: Not Currently  ?Other Topics Concern  ? Not on file  ?Social History Narrative  ? Not on file  ? ?Social Determinants of Health  ? ?Financial Resource Strain: Low Risk   ? Difficulty of Paying Living Expenses: Not hard at all  ?Food Insecurity: No Food Insecurity  ? Worried About [employer] fund[place] in the Last Year: Never true  ? Ran Out of Food in the Last Year: Never true  ?Transportation Needs: No Transportation Needs  ? Lack of Transportation (Medical): No  ? Lack of Transportation (Non-Medical): No  ?Physical Activity: Unknown  ? Days of Exercise per Week: Not on file  ? Minutes of Exercise per Session: 30  min  ?Stress: Stress Concern Present  ? Feeling of Stress : To some extent  ?Social Connections: Socially Isolated  ? Frequency of Communication with Friends and Family: More than three times a week  ? Frequency of Social Gatherings with Friends and Family: More than three times a week  ? Attends Religious Services: Never  ? Active Member of Clubs or Organizations: No  ? Attends Archivist Meetings: Never  ? Marital Status: Divorced  ?Intimate Partner Violence: Not At Risk  ? Fear of Current or Ex-Partner: No  ? Emotionally Abused: No  ? Physically Abused: No  ? Sexually Abused: No  ? ? ?Family History  ?Problem Relation Age of Onset  ? Thyroid cancer Mother   ?     dx 78s; currently 55  ? Hypertension Mother   ? Diabetes Mother   ? Breast cancer Maternal Aunt 38  ?     deceased 49s  ? Alzheimer's disease Father   ?     deceased 15  ? Stroke Father   ? Drug abuse Sister   ?     deceased 42  ? Diabetes Brother   ? Hypertension Brother   ? Heart attack Maternal Grandfather   ? Diabetes  Brother   ? Hypertension Brother   ? Alcohol abuse Brother   ? Schizophrenia Brother   ? Stomach cancer Paternal Aunt   ?     age at dx unknown  ? Stomach cancer Paternal Uncle   ?     age at dx unknown  ? Cancer O

## 2021-10-15 ENCOUNTER — Ambulatory Visit
Admission: RE | Admit: 2021-10-15 | Discharge: 2021-10-15 | Disposition: A | Discharge status: Home / Self Care | Payer: Medicare Other | Source: Ambulatory Visit | Attending: Radiation Oncology | Admitting: Radiation Oncology

## 2021-10-15 ENCOUNTER — Encounter: Payer: Self-pay | Admitting: Radiation Oncology

## 2021-10-15 VITALS — BP 164/92 | HR 81 | Temp 98.0°F | Resp 20 | Wt 183.6 lb

## 2021-10-15 DIAGNOSIS — Z79811 Long term (current) use of aromatase inhibitors: Secondary | ICD-10-CM | POA: Diagnosis not present

## 2021-10-15 DIAGNOSIS — Z923 Personal history of irradiation: Secondary | ICD-10-CM | POA: Diagnosis not present

## 2021-10-15 DIAGNOSIS — Z170 Estrogen receptor positive status [ER+]: Secondary | ICD-10-CM | POA: Diagnosis not present

## 2021-10-15 DIAGNOSIS — C50412 Malignant neoplasm of upper-outer quadrant of left female breast: Secondary | ICD-10-CM | POA: Insufficient documentation

## 2021-10-15 NOTE — Progress Notes (Signed)
Radiation Oncology ?Follow up Note ? ?Name: Tiffany Mathews   ?Date:   10/15/2021 ?MRN:  [id number] ?DOB: November 10, 1960  ? ? ?This 61 y.o. female presents to the clinic today for 3-year follow-up status post whole breast radiation to her left breast for stage I ER/PR positive invasive mammary carcinoma. ? ?REFERRING PROVIDER: Cletis Athens, MD ? ?HPI: Patient is a 61 year old female now out 3 years having pleated whole breast radiation to her left breast for stage I ER/PR positive invasive mammary carcinoma.  Seen today in routine follow-up she is doing well from a breast standpoint she specifically denies breast tenderness cough or bone pain she has been monitoring her blood pressure her diastolic is over 90 and I have asked her to contact her family doctor to check on that..She is currently on Arimidex tolerating it well without side effect.She had mammograms back in October which I have reviewed were BI-RADS 1 negative ? ?COMPLICATIONS OF TREATMENT: none ? ?FOLLOW UP COMPLIANCE: keeps appointments  ? ?PHYSICAL EXAM:  ?BP (!) 164/92   Pulse 81   Temp 98 ?F (36.7 ?C)   Resp 20   Wt 183 lb 9.6 oz (83.3 kg)   SpO2 100%   BMI 30.55 kg/m?  ?Lungs are clear to A&P cardiac examination essentially unremarkable with regular rate and rhythm. No dominant mass or nodularity is noted in either breast in 2 positions examined. Incision is well-healed. No axillary or supraclavicular adenopathy is appreciated. Cosmetic result is excellent.  Well-developed well-nourished patient in NAD. HEENT reveals PERLA, EOMI, discs not visualized.  Oral cavity is clear. No oral mucosal lesions are identified. Neck is clear without evidence of cervical or supraclavicular adenopathy. Lungs are clear to A&P. Cardiac examination is essentially unremarkable with regular rate and rhythm without murmur rub or thrill. Abdomen is benign with no organomegaly or masses noted. Motor sensory and DTR levels are equal and symmetric in the upper and lower  extremities. Cranial nerves II through XII are grossly intact. Proprioception is intact. No peripheral adenopathy or edema is identified. No motor or sensory levels are noted. Crude visual fields are within normal range. ? ?RADIOLOGY RESULTS: Mammograms reviewed compatible with above-stated findings ? ?PLAN: Present time patient is out 3 years with no evidence of disease.  I will see her back in 1 year for follow-up and then discontinue follow-up care.  I have asked her to contact her PMD about her blood pressure.  Patient continues on Arimidex without side effect.  Patient is to call with any concerns. ? ?I would like to take this opportunity to thank you for allowing me to participate in the care of your patient.. ?  ? Noreene Filbert, MD ? ?

## 2021-10-16 ENCOUNTER — Other Ambulatory Visit: Payer: Self-pay | Admitting: Gastroenterology

## 2021-10-19 NOTE — Telephone Encounter (Signed)
Patient called and is requesting a refill on prescription (pantoprazole 40 MG).Patient states she has none left. Requests a call back when done so she knows to go pick up.  ?

## 2021-10-20 ENCOUNTER — Encounter: Payer: Self-pay | Admitting: Internal Medicine

## 2021-10-20 ENCOUNTER — Ambulatory Visit (INDEPENDENT_AMBULATORY_CARE_PROVIDER_SITE_OTHER): Payer: Medicare Other | Admitting: Internal Medicine

## 2021-10-20 VITALS — BP 156/95 | HR 81 | Ht 65.0 in | Wt 184.5 lb

## 2021-10-20 DIAGNOSIS — R000 Tachycardia, unspecified: Secondary | ICD-10-CM | POA: Diagnosis not present

## 2021-10-20 DIAGNOSIS — F3160 Bipolar disorder, current episode mixed, unspecified: Secondary | ICD-10-CM

## 2021-10-20 DIAGNOSIS — I10 Essential (primary) hypertension: Secondary | ICD-10-CM

## 2021-10-20 DIAGNOSIS — Z720 Tobacco use: Secondary | ICD-10-CM

## 2021-10-20 DIAGNOSIS — K219 Gastro-esophageal reflux disease without esophagitis: Secondary | ICD-10-CM | POA: Diagnosis not present

## 2021-10-20 MED ORDER — DILTIAZEM HCL ER COATED BEADS 180 MG PO CP24: 180.0000 mg | ORAL_CAPSULE | Freq: Every day | ORAL | 1 refills | Status: DC

## 2021-10-20 MED ORDER — PANTOPRAZOLE SODIUM 40 MG PO TBEC: 40.0000 mg | DELAYED_RELEASE_TABLET | Freq: Two times a day (BID) | ORAL | 3 refills | Status: DC

## 2021-10-20 NOTE — Progress Notes (Signed)
? ?Established Patient Office Visit ? ?Subjective:  ?Patient ID: Tiffany Mathews, female    DOB: 08-18-1960  Age: 61 y.o. MRN: [id number] ? ?CC:  ?Chief Complaint  ?Patient presents with  ? Follow-up  ?  Patient has been having elevated BP   ?Patient also complains of intermittent tachycardia, ?She does not have any chest pain ?Her medicines were discussed with her. ?Patient was advised to reduce her losartan to 100 mg p.o. daily ? ?HPI ? ?Tiffany Mathews presents for high BP and tachy cardia ? ?Past Medical History:  ?Diagnosis Date  ? Anxiety   ? Arthritis   ? Bipolar 1 disorder ([place])   ? Breast cancer ([place])   ? Depression   ? Fibromyalgia   ? GERD (gastroesophageal reflux disease)   ? Hidradenitis suppurativa   ? History of hiatal hernia   ? Hydradenitis   ? Hyperlipidemia   ? Hypertension   ? Multiple thyroid nodules   ? benign  ? Sleep apnea   ? has CPAP, doesn't use  ? ? ?Past Surgical History:  ?Procedure Laterality Date  ? ABLATION  04/2011  ? BREAST BIOPSY Right 2009  ? INVASIVE MAMMARY CARCINOMA, GRADE 1  ? BREAST LUMPECTOMY Left 04/19/2018  ? INVASIVE MAMMARY CARCINOMA, GRADE 1  ? CESAREAN SECTION    ? x3  ? CHOLECYSTECTOMY    ? COLONOSCOPY WITH PROPOFOL N/A 03/09/2019  ? Procedure: COLONOSCOPY WITH PROPOFOL;  Surgeon: Lucilla Lame, MD;  Location: [place];  Service: Endoscopy;  Laterality: N/A;  sleep apnea  ? GALLBLADDER SURGERY    ? LYSIS OF ADHESION  05/27/2016  ? Procedure: LYSIS OF ADHESION;  Surgeon: Clayburn Pert, MD;  Location: [facility];  Service: General;;  ? PARTIAL MASTECTOMY WITH NEEDLE LOCALIZATION Left 04/19/2018  ? Procedure: PARTIAL MASTECTOMY WITH NEEDLE LOCALIZATION;  Surgeon: Herbert Pun, MD;  Location: [facility];  Service: General;  Laterality: Left;  ? ROBOTIC ASSISTED LAPAROSCOPIC CHOLECYSTECTOMY N/A 05/27/2016  ? Procedure: ROBOTIC ASSISTED LAPAROSCOPIC CHOLECYSTECTOMY;  Surgeon: Clayburn Pert, MD;  Location: [facility];  Service: General;  Laterality: N/A;  ?  SENTINEL NODE BIOPSY Left 04/19/2018  ? Procedure: SENTINEL NODE BIOPSY;  Surgeon: Herbert Pun, MD;  Location: [facility];  Service: General;  Laterality: Left;  ? SHOULDER ARTHROSCOPY  2000  ? TONSILLECTOMY Right 09/19/2019  ? Procedure: PARTIAL VS TOTAL TONSILLECTOMY;  Surgeon: Carloyn Manner, MD;  Location: [place];  Service: ENT;  Laterality: Right;  ? TUBAL LIGATION    ? ? ?Family History  ?Problem Relation Age of Onset  ? Thyroid cancer Mother   ?     dx 62s; currently 65  ? Hypertension Mother   ? Diabetes Mother   ? Breast cancer Maternal Aunt 14  ?     deceased 54s  ? Alzheimer's disease Father   ?     deceased 41  ? Stroke Father   ? Drug abuse Sister   ?     deceased 46  ? Diabetes Brother   ? Hypertension Brother   ? Heart attack Maternal Grandfather   ? Diabetes Brother   ? Hypertension Brother   ? Alcohol abuse Brother   ? Schizophrenia Brother   ? Stomach cancer Paternal Aunt   ?     age at dx unknown  ? Stomach cancer Paternal Uncle   ?     age at dx unknown  ? Cancer Other   ?     daughter of sister who died at  60; breast ca in 47s and ovarian cancer at 23; currently 73s  ? ? ?Social History  ? ?Socioeconomic History  ? Marital status: Divorced  ?  Spouse name: Not on file  ? Number of children: Not on file  ? Years of education: Not on file  ? Highest education level: Not on file  ?Occupational History  ? Not on file  ?Tobacco Use  ? Smoking status: Every Day  ?  Packs/day: 0.50  ?  Years: 28.00  ?  Pack years: 14.00  ?  Types: Cigarettes  ? Smokeless tobacco: Never  ? Tobacco comments:  ?  started around 1993  ?Vaping Use  ? Vaping Use: Former  ?Substance and Sexual Activity  ? Alcohol use: No  ? Drug use: No  ? Sexual activity: Not Currently  ?Other Topics Concern  ? Not on file  ?Social History Narrative  ? Not on file  ? ?Social Determinants of Health  ? ?Financial Resource Strain: Low Risk   ? Difficulty of Paying Living Expenses: Not hard at all  ?Food Insecurity: No  Food Insecurity  ? Worried About Charity fundraiser in the Last Year: Never true  ? Ran Out of Food in the Last Year: Never true  ?Transportation Needs: No Transportation Needs  ? Lack of Transportation (Medical): No  ? Lack of Transportation (Non-Medical): No  ?Physical Activity: Unknown  ? Days of Exercise per Week: Not on file  ? Minutes of Exercise per Session: 30 min  ?Stress: Stress Concern Present  ? Feeling of Stress : To some extent  ?Social Connections: Socially Isolated  ? Frequency of Communication with Friends and Family: More than three times a week  ? Frequency of Social Gatherings with Friends and Family: More than three times a week  ? Attends Religious Services: Never  ? Active Member of Clubs or Organizations: No  ? Attends Archivist Meetings: Never  ? Marital Status: Divorced  ?Intimate Partner Violence: Not At Risk  ? Fear of Current or Ex-Partner: No  ? Emotionally Abused: No  ? Physically Abused: No  ? Sexually Abused: No  ? ? ? ?Current Outpatient Medications:  ?  amitriptyline (ELAVIL) 25 MG tablet, , Disp: , Rfl:  ?  anastrozole (ARIMIDEX) 1 MG tablet, Take 1 tablet (1 mg total) by mouth daily., Disp: 90 tablet, Rfl: 2 ?  aspirin EC 81 MG tablet, Take 81 mg by mouth daily. , Disp: , Rfl:  ?  atorvastatin (LIPITOR) 40 MG tablet, Take 1 tablet (40 mg total) by mouth daily., Disp: 90 tablet, Rfl: 3 ?  clonazePAM (KLONOPIN) 0.5 MG tablet, Take 0.5 mg by mouth 2 (two) times daily., Disp: , Rfl:  ?  diltiazem (CARDIZEM CD) 180 MG 24 hr capsule, Take 1 capsule (180 mg total) by mouth daily., Disp: 90 capsule, Rfl: 1 ?  fluticasone (FLONASE) 50 MCG/ACT nasal spray, Place 1 spray into both nostrils 2 (two) times daily., Disp: , Rfl:  ?  gabapentin (NEURONTIN) 300 MG capsule, Take 300 mg by mouth 3 (three) times daily. , Disp: , Rfl: 1 ?  loratadine (CLARITIN) 10 MG tablet, Take 10 mg by mouth daily as needed for allergies., Disp: , Rfl: 3 ?  losartan (COZAAR) 100 MG tablet, Take 1 tablet  (100 mg total) by mouth every 12 (twelve) hours., Disp: 180 tablet, Rfl: 3 ?  methocarbamol (ROBAXIN) 500 MG tablet, Take by mouth., Disp: , Rfl:  ?  methylphenidate (RITALIN) 5 MG  tablet, Take 5 mg by mouth 2 (two) times daily., Disp: , Rfl:  ?  Multiple Vitamins-Minerals (MULTIVITAMIN WITH MINERALS) tablet, Take 1 tablet by mouth daily., Disp: , Rfl:  ?  traZODone (DESYREL) 100 MG tablet, Take 100 mg by mouth at bedtime as needed (sleep). , Disp: , Rfl: 1 ?  venlafaxine XR (EFFEXOR-XR) 75 MG 24 hr capsule, Take 1 capsule by mouth daily., Disp: , Rfl:  ?  VITAMIN D PO, Take by mouth daily., Disp: , Rfl:  ?  ziprasidone (GEODON) 40 MG capsule, Take 40 mg by mouth every evening. , Disp: , Rfl:  ?  pantoprazole (PROTONIX) 40 MG tablet, Take 1 tablet (40 mg total) by mouth 2 (two) times daily. MUST SCHEDULE OFFICE VISIT, Disp: 180 tablet, Rfl: 3  ? ?Allergies  ?Allergen Reactions  ? Lamictal [Lamotrigine] Rash and Other (See Comments)  ? Depakote [Divalproex Sodium] Other (See Comments)  ?  Severe hair loss  ? Other Other (See Comments)  ?  Flowers and pollen  ? Lithium Other (See Comments)  ?  Unknown; "It just made me feel bad."  ? ? ?ROS ?Review of Systems  ?Constitutional: Negative.   ?HENT: Negative.  Negative for rhinorrhea and sinus pressure.   ?Eyes: Negative.  Negative for pain.  ?Respiratory: Negative.  Negative for cough, chest tightness, shortness of breath and wheezing.   ?Cardiovascular:  Positive for palpitations. Negative for chest pain and leg swelling.  ?Gastrointestinal: Negative.   ?Endocrine: Negative.   ?Genitourinary: Negative.   ?Musculoskeletal: Negative.   ?Skin: Negative.   ?Allergic/Immunologic: Negative.   ?Neurological: Negative.   ?Hematological: Negative.   ?Psychiatric/Behavioral: Negative.    ?All other systems reviewed and are negative. ? ?  ?Objective:  ?  ?Physical Exam ?Vitals reviewed.  ?Constitutional:   ?   Appearance: Normal appearance.  ?HENT:  ?   Mouth/Throat:  ?   Mouth:  Mucous membranes are moist.  ?Eyes:  ?   Pupils: Pupils are equal, round, and reactive to light.  ?Neck:  ?   Vascular: No carotid bruit.  ?Cardiovascular:  ?   Rate and Rhythm: Normal rate and regular rhyt

## 2021-10-20 NOTE — Assessment & Plan Note (Signed)
-   I instructed the patient to stop smoking and provided them with smoking cessation materials.  - I informed the patient that smoking puts them at increased risk for cancer, COPD, hypertension, and more.  - Informed the patient to seek help if they begin to have trouble breathing, develop chest pain, start to cough up blood, feel faint, or pass out.  

## 2021-10-20 NOTE — Assessment & Plan Note (Signed)
Patient denies any chest pain or shortness of breath there is no history of palpitation or paroxysmal nocturnal dyspnea ?  patient was advised to follow low-salt low-cholesterol diet ? ?  ideally I want to keep systolic blood pressure below 130 mmHg, patient was asked to check blood pressure one times a week and give me a report on that.  Patient will be follow-up in 3 months  or earlier as needed, patient will call me back for any change in the cardiovascular symptoms ?Patient was advised to buy a book from local bookstore concerning blood pressure and read several chapters  every day.  This will be supplemented by some of the material we will give him from the office.  Patient should also utilize other resources like YouTube and Internet to learn more about the blood pressure and the diet. ? ?On today's examination the blood pressure was found to be elevated so I started her on Cardizem 180 mg p.o. daily we will reduce the losartan to 100 mg p.o. daily.  She will come back in 1 week for recheck. ?

## 2021-10-20 NOTE — Assessment & Plan Note (Signed)
-   The patient's GERD is stable on medication.  - Instructed the patient to avoid eating spicy and acidic foods, as well as foods high in fat. - Instructed the patient to avoid eating large meals or meals 2-3 hours prior to sleeping. 

## 2021-10-20 NOTE — Assessment & Plan Note (Signed)
Electrocardiogram does not show any acute changes, no evidence of atrial fibrillation ?

## 2021-10-20 NOTE — Assessment & Plan Note (Signed)
Stable at the present time. 

## 2021-10-23 NOTE — Telephone Encounter (Signed)
Dr Lavera Guise has filled medication for 1 year

## 2021-10-27 ENCOUNTER — Encounter: Payer: Self-pay | Admitting: Internal Medicine

## 2021-10-27 ENCOUNTER — Ambulatory Visit (INDEPENDENT_AMBULATORY_CARE_PROVIDER_SITE_OTHER): Payer: Medicare Other | Admitting: Internal Medicine

## 2021-10-27 VITALS — BP 138/80 | HR 86 | Ht 65.0 in | Wt 184.5 lb

## 2021-10-27 DIAGNOSIS — Z720 Tobacco use: Secondary | ICD-10-CM

## 2021-10-27 DIAGNOSIS — F3177 Bipolar disorder, in partial remission, most recent episode mixed: Secondary | ICD-10-CM

## 2021-10-27 DIAGNOSIS — K5792 Diverticulitis of intestine, part unspecified, without perforation or abscess without bleeding: Secondary | ICD-10-CM

## 2021-10-27 DIAGNOSIS — K219 Gastro-esophageal reflux disease without esophagitis: Secondary | ICD-10-CM

## 2021-10-27 DIAGNOSIS — I10 Essential (primary) hypertension: Secondary | ICD-10-CM | POA: Diagnosis not present

## 2021-10-27 NOTE — Assessment & Plan Note (Signed)

## 2021-10-27 NOTE — Assessment & Plan Note (Signed)
Stable

## 2021-10-27 NOTE — Assessment & Plan Note (Signed)
-   I instructed the patient to stop smoking and provided them with smoking cessation materials.  - I informed the patient that smoking puts them at increased risk for cancer, COPD, hypertension, and more.  - Informed the patient to seek help if they begin to have trouble breathing, develop chest pain, start to cough up blood, feel faint, or pass out.  

## 2021-10-27 NOTE — Assessment & Plan Note (Signed)

## 2021-10-27 NOTE — Assessment & Plan Note (Signed)
Stable on the present medications

## 2021-10-27 NOTE — Progress Notes (Signed)
Established Patient Office Visit  Subjective:  Patient ID: Tiffany Mathews, female    DOB: 11/12/1960  Age: 62 y.o. MRN: [id number]  CC:  Chief Complaint  Patient presents with   Follow-up    Patient here for 1 week follow up     HPI  Tiffany Mathews presents for check up  Past Medical History:  Diagnosis Date   Anxiety    Arthritis    Bipolar 1 disorder ([facility])    Breast cancer ([place])    Depression    Fibromyalgia    GERD (gastroesophageal reflux disease)    Hidradenitis suppurativa    History of hiatal hernia    Hydradenitis    Hyperlipidemia    Hypertension    Multiple thyroid nodules    benign   Sleep apnea    has CPAP, doesn't use    Past Surgical History:  Procedure Laterality Date   ABLATION  04/2011   BREAST BIOPSY Right 2009   INVASIVE MAMMARY CARCINOMA, GRADE 1   BREAST LUMPECTOMY Left 04/19/2018   INVASIVE MAMMARY CARCINOMA, GRADE 1   CESAREAN SECTION     x3   CHOLECYSTECTOMY     COLONOSCOPY WITH PROPOFOL N/A 03/09/2019   Procedure: COLONOSCOPY WITH PROPOFOL;  Surgeon: Lucilla Lame, MD;  Location: [facility];  Service: Endoscopy;  Laterality: N/A;  sleep apnea   GALLBLADDER SURGERY     LYSIS OF ADHESION  05/27/2016   Procedure: LYSIS OF ADHESION;  Surgeon: Clayburn Pert, MD;  Location: [facility];  Service: General;;   PARTIAL MASTECTOMY WITH NEEDLE LOCALIZATION Left 04/19/2018   Procedure: PARTIAL MASTECTOMY WITH NEEDLE LOCALIZATION;  Surgeon: Herbert Pun, MD;  Location: [facility];  Service: General;  Laterality: Left;   ROBOTIC ASSISTED LAPAROSCOPIC CHOLECYSTECTOMY N/A 05/27/2016   Procedure: ROBOTIC ASSISTED LAPAROSCOPIC CHOLECYSTECTOMY;  Surgeon: Clayburn Pert, MD;  Location: [facility];  Service: General;  Laterality: N/A;   SENTINEL NODE BIOPSY Left 04/19/2018   Procedure: SENTINEL NODE BIOPSY;  Surgeon: Herbert Pun, MD;  Location: [facility];  Service: General;  Laterality: Left;   SHOULDER ARTHROSCOPY  2000    TONSILLECTOMY Right 09/19/2019   Procedure: PARTIAL VS TOTAL TONSILLECTOMY;  Surgeon: Carloyn Manner, MD;  Location: [facility];  Service: ENT;  Laterality: Right;   TUBAL LIGATION      Family History  Problem Relation Age of Onset   Thyroid cancer Mother        dx 19s; currently 27   Hypertension Mother    Diabetes Mother    Breast cancer Maternal Aunt 2       deceased 51s   Alzheimer's disease Father        deceased 53   Stroke Father    Drug abuse Sister        deceased 45   Diabetes Brother    Hypertension Brother    Heart attack Maternal Grandfather    Diabetes Brother    Hypertension Brother    Alcohol abuse Brother    Schizophrenia Brother    Stomach cancer Paternal Aunt        age at dx unknown   Stomach cancer Paternal Uncle        age at dx unknown   Cancer Other        daughter of sister who died at 30; breast ca in 34s and ovarian cancer at 34; currently 45s    Social History   Socioeconomic History   Marital status: Divorced    Spouse name: Not  on file   Number of children: Not on file   Years of education: Not on file   Highest education level: Not on file  Occupational History   Not on file  Tobacco Use   Smoking status: Every Day    Packs/day: 0.50    Years: 28.00    Pack years: 14.00    Types: Cigarettes   Smokeless tobacco: Never   Tobacco comments:    started around 1993  Vaping Use   Vaping Use: Former  Substance and Sexual Activity   Alcohol use: No   Drug use: No   Sexual activity: Not Currently  Other Topics Concern   Not on file  Social History Narrative   Not on file   Social Determinants of Health   Financial Resource Strain: Low Risk    Difficulty of Paying Living Expenses: Not hard at all  Food Insecurity: No Food Insecurity   Worried About Charity fundraiser in the Last Year: Never true   [place] in the Last Year: Never true  Transportation Needs: No Transportation Needs   Lack of Transportation  (Medical): No   Lack of Transportation (Non-Medical): No  Physical Activity: Unknown   Days of Exercise per Week: Not on file   Minutes of Exercise per Session: 30 min  Stress: Stress Concern Present   Feeling of Stress : To some extent  Social Connections: Socially Isolated   Frequency of Communication with Friends and Family: More than three times a week   Frequency of Social Gatherings with Friends and Family: More than three times a week   Attends Religious Services: Never   Marine scientist or Organizations: No   Attends Music therapist: Never   Marital Status: Divorced  Human resources officer Violence: Not At Risk   Fear of Current or Ex-Partner: No   Emotionally Abused: No   Physically Abused: No   Sexually Abused: No     Current Outpatient Medications:    amitriptyline (ELAVIL) 25 MG tablet, , Disp: , Rfl:    anastrozole (ARIMIDEX) 1 MG tablet, Take 1 tablet (1 mg total) by mouth daily., Disp: 90 tablet, Rfl: 2   aspirin EC 81 MG tablet, Take 81 mg by mouth daily. , Disp: , Rfl:    atorvastatin (LIPITOR) 40 MG tablet, Take 1 tablet (40 mg total) by mouth daily., Disp: 90 tablet, Rfl: 3   clonazePAM (KLONOPIN) 0.5 MG tablet, Take 0.5 mg by mouth 2 (two) times daily., Disp: , Rfl:    diltiazem (CARDIZEM CD) 180 MG 24 hr capsule, Take 1 capsule (180 mg total) by mouth daily., Disp: 90 capsule, Rfl: 1   fluticasone (FLONASE) 50 MCG/ACT nasal spray, Place 1 spray into both nostrils 2 (two) times daily., Disp: , Rfl:    gabapentin (NEURONTIN) 300 MG capsule, Take 300 mg by mouth 3 (three) times daily. , Disp: , Rfl: 1   loratadine (CLARITIN) 10 MG tablet, Take 10 mg by mouth daily as needed for allergies., Disp: , Rfl: 3   losartan (COZAAR) 100 MG tablet, Take 1 tablet (100 mg total) by mouth every 12 (twelve) hours., Disp: 180 tablet, Rfl: 3   methocarbamol (ROBAXIN) 500 MG tablet, Take by mouth., Disp: , Rfl:    methylphenidate (RITALIN) 5 MG tablet, Take 5 mg by  mouth 2 (two) times daily., Disp: , Rfl:    Multiple Vitamins-Minerals (MULTIVITAMIN WITH MINERALS) tablet, Take 1 tablet by mouth daily., Disp: , Rfl:  pantoprazole (PROTONIX) 40 MG tablet, Take 1 tablet (40 mg total) by mouth 2 (two) times daily. MUST SCHEDULE OFFICE VISIT, Disp: 180 tablet, Rfl: 3   traZODone (DESYREL) 100 MG tablet, Take 100 mg by mouth at bedtime as needed (sleep). , Disp: , Rfl: 1   venlafaxine XR (EFFEXOR-XR) 75 MG 24 hr capsule, Take 1 capsule by mouth daily., Disp: , Rfl:    VITAMIN D PO, Take by mouth daily., Disp: , Rfl:    ziprasidone (GEODON) 40 MG capsule, Take 40 mg by mouth every evening. , Disp: , Rfl:    Allergies  Allergen Reactions   Lamictal [Lamotrigine] Rash and Other (See Comments)   Depakote [Divalproex Sodium] Other (See Comments)    Severe hair loss   Other Other (See Comments)    Flowers and pollen   Lithium Other (See Comments)    Unknown; "It just made me feel bad."    ROS Review of Systems  Constitutional: Negative.   HENT: Negative.    Eyes: Negative.   Respiratory: Negative.    Cardiovascular: Negative.   Gastrointestinal: Negative.   Endocrine: Negative.   Genitourinary: Negative.   Musculoskeletal: Negative.   Skin: Negative.   Allergic/Immunologic: Negative.   Neurological: Negative.   Hematological: Negative.   Psychiatric/Behavioral: Negative.    All other systems reviewed and are negative.    Objective:    Physical Exam Vitals reviewed.  Constitutional:      Appearance: Normal appearance.  HENT:     Mouth/Throat:     Mouth: Mucous membranes are moist.  Eyes:     Pupils: Pupils are equal, round, and reactive to light.  Neck:     Vascular: No carotid bruit.  Cardiovascular:     Rate and Rhythm: Normal rate and regular rhythm.     Pulses: Normal pulses.     Heart sounds: Normal heart sounds.  Pulmonary:     Effort: Pulmonary effort is normal.     Breath sounds: Normal breath sounds.  Abdominal:      General: Bowel sounds are normal.     Palpations: Abdomen is soft. There is no hepatomegaly, splenomegaly or mass.     Tenderness: There is no abdominal tenderness.     Hernia: No hernia is present.  Musculoskeletal:        General: No tenderness.     Cervical back: Neck supple.     Right lower leg: No edema.     Left lower leg: No edema.  Skin:    Findings: No rash.  Neurological:     Mental Status: She is alert and oriented to person, place, and time.     Motor: No weakness.  Psychiatric:        Mood and Affect: Mood and affect normal.        Behavior: Behavior normal.    BP 138/80   Pulse 86   Ht 5' 5" (1.651 m)   Wt 184 lb 8 oz (83.7 kg)   BMI 30.70 kg/m  Wt Readings from Last 3 Encounters:  10/27/21 184 lb 8 oz (83.7 kg)  10/20/21 184 lb 8 oz (83.7 kg)  10/15/21 183 lb 9.6 oz (83.3 kg)     Health Maintenance Due  Topic Date Due   Zoster Vaccines- Shingrix (1 of 2) Never done   COVID-19 Vaccine (3 - Moderna risk series) 11/03/2019   PAP SMEAR-Modifier  05/25/2021    There are no preventive care reminders to display for this patient.  Lab  Results  Component Value Date   TSH 2.44 04/14/2021   Lab Results  Component Value Date   WBC 7.9 04/14/2021   HGB 14.4 04/14/2021   HCT 42.2 04/14/2021   MCV 95.3 04/14/2021   PLT 347 04/14/2021   Lab Results  Component Value Date   NA 143 04/14/2021   K 4.4 04/14/2021   CO2 22 04/14/2021   GLUCOSE 104 (H) 04/14/2021   BUN 11 04/14/2021   CREATININE 0.84 04/14/2021   BILITOT 0.4 04/14/2021   ALKPHOS 54 11/21/2018   AST 16 04/14/2021   ALT 13 04/14/2021   PROT 6.5 04/14/2021   ALBUMIN 4.3 11/21/2018   CALCIUM 9.4 04/14/2021   ANIONGAP 10 11/21/2018   EGFR 80 04/14/2021   Lab Results  Component Value Date   CHOL 147 04/14/2021   Lab Results  Component Value Date   HDL 37 (L) 04/14/2021   Lab Results  Component Value Date   LDLCALC 80 04/14/2021   Lab Results  Component Value Date   TRIG 205 (H)  04/14/2021   Lab Results  Component Value Date   CHOLHDL 4.0 04/14/2021   Lab Results  Component Value Date   HGBA1C 5.4 04/14/2021      Assessment & Plan:   Problem List Items Addressed This Visit       Cardiovascular and Mediastinum   Hypertension - Primary     Patient denies any chest pain or shortness of breath there is no history of palpitation or paroxysmal nocturnal dyspnea   patient was advised to follow low-salt low-cholesterol diet    ideally I want to keep systolic blood pressure below 130 mmHg, patient was asked to check blood pressure one times a week and give me a report on that.  Patient will be follow-up in 3 months  or earlier as needed, patient will call me back for any change in the cardiovascular symptoms Patient was advised to buy a book from local bookstore concerning blood pressure and read several chapters  every day.  This will be supplemented by some of the material we will give him from the office.  Patient should also utilize other resources like YouTube and Internet to learn more about the blood pressure and the diet.         Digestive   Gastroesophageal reflux disease without esophagitis    Counseling  If a person has gastroesophageal reflux disease (GERD), food and stomach acid move back up into the esophagus and cause symptoms or problems such as damage to the esophagus.  Anti-reflux measures include: raising the head of the bed, avoiding tight clothing or belts, avoiding eating late at night, not lying down shortly after mealtime, and achieving weight loss.  Avoid ASA, NSAID's, caffeine, alcohol, and tobacco.   OTC Pepcid and/or Tums are often very helpful for as needed use.   However, for persisting chronic or daily symptoms, stronger medications like Omeprazole may be needed.  You may need to avoid foods and drinks such as: ? Coffee and tea (with or without caffeine). ? Drinks that contain alcohol. ? Energy drinks and sports  drinks. ? Bubbly (carbonated) drinks or sodas. ? Chocolate and cocoa. ? Peppermint and mint flavorings. ? Garlic and onions. ? Horseradish. ? Spicy and acidic foods. These include peppers, chili powder, curry powder, vinegar, hot sauces, and BBQ sauce. ? Citrus fruit juices and citrus fruits, such as oranges, lemons, and limes. ? Tomato-based foods. These include red sauce, chili, salsa, and pizza with red  sauce. ? Fried and fatty foods. These include donuts, french fries, potato chips, and high-fat dressings. ? High-fat meats. These include hot dogs, rib eye steak, sausage, ham, and bacon.          Other   Bipolar affective disorder ([place])    Stable on the present medications       Diverticulitis    Stable       Tobacco abuse    - I instructed the patient to stop smoking and provided them with smoking cessation materials.  - I informed the patient that smoking puts them at increased risk for cancer, COPD, hypertension, and more.  - Informed the patient to seek help if they begin to have trouble breathing, develop chest pain, start to cough up blood, feel faint, or pass out.        No orders of the defined types were placed in this encounter.   Follow-up: No follow-ups on file.    Cletis Athens, MD

## 2021-12-07 LAB — HM PAP SMEAR: HM Pap smear: NORMAL

## 2021-12-07 LAB — RESULTS CONSOLE HPV: CHL HPV: NEGATIVE

## 2022-02-03 ENCOUNTER — Other Ambulatory Visit: Payer: Self-pay | Admitting: General Surgery

## 2022-02-03 DIAGNOSIS — Z1231 Encounter for screening mammogram for malignant neoplasm of breast: Secondary | ICD-10-CM

## 2022-03-18 ENCOUNTER — Other Ambulatory Visit: Payer: Self-pay | Admitting: Internal Medicine

## 2022-03-18 DIAGNOSIS — K219 Gastro-esophageal reflux disease without esophagitis: Secondary | ICD-10-CM

## 2022-03-29 ENCOUNTER — Other Ambulatory Visit: Payer: Self-pay | Admitting: Internal Medicine

## 2022-03-29 DIAGNOSIS — K219 Gastro-esophageal reflux disease without esophagitis: Secondary | ICD-10-CM

## 2022-04-07 ENCOUNTER — Ambulatory Visit
Admission: RE | Admit: 2022-04-07 | Discharge: 2022-04-07 | Disposition: A | Discharge status: Home / Self Care | Payer: Medicare Other | Source: Ambulatory Visit | Attending: General Surgery | Admitting: General Surgery

## 2022-04-07 DIAGNOSIS — Z1231 Encounter for screening mammogram for malignant neoplasm of breast: Secondary | ICD-10-CM | POA: Diagnosis present

## 2022-04-14 ENCOUNTER — Inpatient Hospital Stay: Payer: Medicare Other | Attending: Oncology | Admitting: Oncology

## 2022-04-14 ENCOUNTER — Encounter: Payer: Self-pay | Admitting: Oncology

## 2022-04-14 ENCOUNTER — Other Ambulatory Visit: Payer: Self-pay | Admitting: *Deleted

## 2022-04-14 VITALS — BP 135/90 | HR 94 | Temp 98.2°F | Resp 18 | Wt 180.0 lb

## 2022-04-14 DIAGNOSIS — Z170 Estrogen receptor positive status [ER+]: Secondary | ICD-10-CM | POA: Diagnosis not present

## 2022-04-14 DIAGNOSIS — Z780 Asymptomatic menopausal state: Secondary | ICD-10-CM

## 2022-04-14 DIAGNOSIS — Z5181 Encounter for therapeutic drug level monitoring: Secondary | ICD-10-CM

## 2022-04-14 DIAGNOSIS — Z08 Encounter for follow-up examination after completed treatment for malignant neoplasm: Secondary | ICD-10-CM

## 2022-04-14 DIAGNOSIS — C50412 Malignant neoplasm of upper-outer quadrant of left female breast: Secondary | ICD-10-CM | POA: Diagnosis not present

## 2022-04-14 DIAGNOSIS — Z79899 Other long term (current) drug therapy: Secondary | ICD-10-CM | POA: Insufficient documentation

## 2022-04-14 DIAGNOSIS — F17210 Nicotine dependence, cigarettes, uncomplicated: Secondary | ICD-10-CM | POA: Insufficient documentation

## 2022-04-14 DIAGNOSIS — K219 Gastro-esophageal reflux disease without esophagitis: Secondary | ICD-10-CM | POA: Diagnosis not present

## 2022-04-14 DIAGNOSIS — I10 Essential (primary) hypertension: Secondary | ICD-10-CM | POA: Diagnosis not present

## 2022-04-14 DIAGNOSIS — Z853 Personal history of malignant neoplasm of breast: Secondary | ICD-10-CM

## 2022-04-14 DIAGNOSIS — Z79811 Long term (current) use of aromatase inhibitors: Secondary | ICD-10-CM | POA: Insufficient documentation

## 2022-04-14 DIAGNOSIS — Z800 Family history of malignant neoplasm of digestive organs: Secondary | ICD-10-CM | POA: Diagnosis not present

## 2022-04-14 DIAGNOSIS — Z7982 Long term (current) use of aspirin: Secondary | ICD-10-CM | POA: Insufficient documentation

## 2022-04-14 DIAGNOSIS — G4730 Sleep apnea, unspecified: Secondary | ICD-10-CM | POA: Insufficient documentation

## 2022-04-14 DIAGNOSIS — Z803 Family history of malignant neoplasm of breast: Secondary | ICD-10-CM | POA: Insufficient documentation

## 2022-04-14 DIAGNOSIS — M797 Fibromyalgia: Secondary | ICD-10-CM | POA: Insufficient documentation

## 2022-04-14 DIAGNOSIS — E785 Hyperlipidemia, unspecified: Secondary | ICD-10-CM | POA: Diagnosis not present

## 2022-04-14 NOTE — Progress Notes (Signed)
Hematology/Oncology Consult note [facility]  Telephone:([phone] [phone]  Patient Care Team: Cletis Athens, MD as PCP - General (Internal Medicine) Noreene Filbert, MD as Radiation Oncologist (Radiation Oncology)   Name of the patient: Tiffany Mathews  [id number]  [birth year]   Date of visit: 04/14/22  Diagnosis- history of stage I ER positive left breast cancer   Chief complaint/ Reason for visit-routine follow-up of breast cancer on Arimidex  Heme/Onc history: patient is a 61 year old Hispanic female who underwent bilateral screening mammogram in September 2019 which showed a suspicious mass in the left breast 2 o'clock position measuring 0.8 x 0.5 x 0.8 cm in size.  Approximately 1.2 cm lateral to this mass were 2-3 tightly clustered hypoechoic nodules together measuring 29 x 0.2 x 0.8 cm.  There was also an other hypoechoic area which was suggestive of fibrocystic tissue.  Several other tiny hypoechoic masses were noted with prominent ducts which could represent fibrocystic change.  DCIS is also a concern.  Ultrasound of the left axilla did not reveal any pathologic adenopathy.  Patient underwent core biopsy of the dominant mass as well as the cluster of nodules immediately adjacent to it.  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  Biopsy of the second breast mass was negative for atypia and malignancy.     MRI of bilateral breasts on 04/11/2018 showed 0.8 cm biopsy-proven malignancy in the upper outer quadrant of the left breast.  Second mass in the upper outer quadrant of the left breast which is known and pathology to be benign and enhances to a lesser degree than does the biopsy malignancy.   Patient underwent lumpectomy and sentinel lymph node biopsy on 04/19/2018.  Final pathology showed invasive mammary carcinoma, grade 1, 10 mm. 2 sentinel lymph nodes  were negative for malignancy.  Negative margins.  ER PR greater than 90% positive.  HER-2/neu negative.  pT1b pN0.  Patient completed adjuvant radiation treatment and started Arimidex sometime in February 2020.   Patient did undergo genetic testing which did not reveal any mutation.  Interval history-tolerating Arimidex well along with calcium and vitamin D.  Denies any specific complaints at this time  ECOG PS- 1 Pain scale- 0   Review of systems- Review of Systems  Constitutional:  Negative for chills, fever, malaise/fatigue and weight loss.  HENT:  Negative for congestion, ear discharge and nosebleeds.   Eyes:  Negative for blurred vision.  Respiratory:  Negative for cough, hemoptysis, sputum production, shortness of breath and wheezing.   Cardiovascular:  Negative for chest pain, palpitations, orthopnea and claudication.  Gastrointestinal:  Negative for abdominal pain, blood in stool, constipation, diarrhea, heartburn, melena, nausea and vomiting.  Genitourinary:  Negative for dysuria, flank pain, frequency, hematuria and urgency.  Musculoskeletal:  Negative for back pain, joint pain and myalgias.  Skin:  Negative for rash.  Neurological:  Negative for dizziness, tingling, focal weakness, seizures, weakness and headaches.  Endo/Heme/Allergies:  Does not bruise/bleed easily.  Psychiatric/Behavioral:  Negative for depression and suicidal ideas. The patient does not have insomnia.       Allergies  Allergen Reactions   Lamictal [Lamotrigine] Rash and Other (See Comments)   Depakote [Divalproex Sodium] Other (See Comments)    Severe hair loss   Other Other (See Comments)    Flowers and pollen   Lithium Other (See Comments)    Unknown; "It just made me  feel bad."     Past Medical History:  Diagnosis Date   Anxiety    Arthritis    Bipolar 1 disorder ([place])    Breast cancer ([place])    Depression    Fibromyalgia    GERD (gastroesophageal reflux disease)    Hidradenitis  suppurativa    History of hiatal hernia    Hydradenitis    Hyperlipidemia    Hypertension    Multiple thyroid nodules    benign   Sleep apnea    has CPAP, doesn't use     Past Surgical History:  Procedure Laterality Date   ABLATION  04/2011   BREAST BIOPSY Right 2009   INVASIVE MAMMARY CARCINOMA, GRADE 1   BREAST LUMPECTOMY Left 04/19/2018   INVASIVE MAMMARY CARCINOMA, GRADE 1   CESAREAN SECTION     x3   CHOLECYSTECTOMY     COLONOSCOPY WITH PROPOFOL N/A 03/09/2019   Procedure: COLONOSCOPY WITH PROPOFOL;  Surgeon: Lucilla Lame, MD;  Location: [place];  Service: Endoscopy;  Laterality: N/A;  sleep apnea   GALLBLADDER SURGERY     LYSIS OF ADHESION  05/27/2016   Procedure: LYSIS OF ADHESION;  Surgeon: Clayburn Pert, MD;  Location: [facility];  Service: General;;   PARTIAL MASTECTOMY WITH NEEDLE LOCALIZATION Left 04/19/2018   Procedure: PARTIAL MASTECTOMY WITH NEEDLE LOCALIZATION;  Surgeon: Herbert Pun, MD;  Location: [facility];  Service: General;  Laterality: Left;   ROBOTIC ASSISTED LAPAROSCOPIC CHOLECYSTECTOMY N/A 05/27/2016   Procedure: ROBOTIC ASSISTED LAPAROSCOPIC CHOLECYSTECTOMY;  Surgeon: Clayburn Pert, MD;  Location: [facility];  Service: General;  Laterality: N/A;   SENTINEL NODE BIOPSY Left 04/19/2018   Procedure: SENTINEL NODE BIOPSY;  Surgeon: Herbert Pun, MD;  Location: [facility];  Service: General;  Laterality: Left;   SHOULDER ARTHROSCOPY  2000   TONSILLECTOMY Right 09/19/2019   Procedure: PARTIAL VS TOTAL TONSILLECTOMY;  Surgeon: Carloyn Manner, MD;  Location: [place];  Service: ENT;  Laterality: Right;   TUBAL LIGATION      Social History   Socioeconomic History   Marital status: Divorced    Spouse name: Not on file   Number of children: Not on file   Years of education: Not on file   Highest education level: Not on file  Occupational History   Not on file  Tobacco Use   Smoking status: Every Day    Packs/day:  0.50    Years: 28.00    Total pack years: 14.00    Types: Cigarettes   Smokeless tobacco: Never   Tobacco comments:    started around [place] Use   Vaping Use: Former  Substance and Sexual Activity   Alcohol use: No   Drug use: No   Sexual activity: Not Currently  Other Topics Concern   Not on file  Social History Narrative   Not on file   Social Determinants of Health   Financial Resource Strain: Low Risk  (04/09/2021)   Overall Financial Resource Strain (CARDIA)    Difficulty of Paying Living Expenses: Not hard at all  Food Insecurity: No Food Insecurity (04/09/2021)   Hunger Vital Sign    Worried About Running Out of Food in the Last Year: Never true    [place] in the Last Year: Never true  Transportation Needs: No Transportation Needs (04/09/2021)   PRAPARE - Hydrologist (Medical): No    Lack of Transportation (Non-Medical): No  Physical Activity: Unknown (04/09/2021)   Exercise  Vital Sign    Days of Exercise per Week: Not on file    Minutes of Exercise per Session: 30 min  Stress: Stress Concern Present (04/09/2021)   [place]    Feeling of Stress : To some extent  Social Connections: Socially Isolated (04/09/2021)   Social Connection and Isolation Panel [NHANES]    Frequency of Communication with Friends and Family: More than three times a week    Frequency of Social Gatherings with Friends and Family: More than three times a week    Attends Religious Services: Never    Marine scientist or Organizations: No    Attends Archivist Meetings: Never    Marital Status: Divorced  Human resources officer Violence: Not At Risk (04/09/2021)   Humiliation, Afraid, Rape, and Kick questionnaire    Fear of Current or Ex-Partner: No    Emotionally Abused: No    Physically Abused: No    Sexually Abused: No    Family History  Problem Relation Age of Onset    Thyroid cancer Mother        dx 42s; currently 14   Hypertension Mother    Diabetes Mother    Breast cancer Maternal Aunt 25       deceased 74s   Alzheimer's disease Father        deceased 108   Stroke Father    Drug abuse Sister        deceased 42   Diabetes Brother    Hypertension Brother    Heart attack Maternal Grandfather    Diabetes Brother    Hypertension Brother    Alcohol abuse Brother    Schizophrenia Brother    Stomach cancer Paternal Aunt        age at dx unknown   Stomach cancer Paternal Uncle        age at dx unknown   Cancer Other        daughter of sister who died at 30; breast ca in 34s and ovarian cancer at 79; currently 63s     Current Outpatient Medications:    amitriptyline (ELAVIL) 25 MG tablet, , Disp: , Rfl:    anastrozole (ARIMIDEX) 1 MG tablet, Take 1 tablet (1 mg total) by mouth daily., Disp: 90 tablet, Rfl: 2   aspirin EC 81 MG tablet, Take 81 mg by mouth daily. , Disp: , Rfl:    atorvastatin (LIPITOR) 40 MG tablet, Take 1 tablet by mouth once daily, Disp: 90 tablet, Rfl: 0   clonazePAM (KLONOPIN) 0.5 MG tablet, Take 0.5 mg by mouth 2 (two) times daily., Disp: , Rfl:    diltiazem (CARDIZEM CD) 180 MG 24 hr capsule, Take 1 capsule by mouth once daily, Disp: 90 capsule, Rfl: 0   fluticasone (FLONASE) 50 MCG/ACT nasal spray, Place 1 spray into both nostrils 2 (two) times daily., Disp: , Rfl:    gabapentin (NEURONTIN) 300 MG capsule, Take 300 mg by mouth 3 (three) times daily. , Disp: , Rfl: 1   loratadine (CLARITIN) 10 MG tablet, Take 10 mg by mouth daily as needed for allergies., Disp: , Rfl: 3   losartan (COZAAR) 100 MG tablet, TAKE 1 TABLET BY MOUTH EVERY 12 HOURS, Disp: 180 tablet, Rfl: 0   methocarbamol (ROBAXIN) 500 MG tablet, Take by mouth., Disp: , Rfl:    Multiple Vitamins-Minerals (MULTIVITAMIN WITH MINERALS) tablet, Take 1 tablet by mouth daily., Disp: , Rfl:    pantoprazole (PROTONIX) 40  MG tablet, Take 1 tablet (40 mg total) by mouth 2  (two) times daily. MUST SCHEDULE OFFICE VISIT, Disp: 180 tablet, Rfl: 3   traZODone (DESYREL) 100 MG tablet, Take 100 mg by mouth at bedtime as needed (sleep). , Disp: , Rfl: 1   venlafaxine XR (EFFEXOR-XR) 75 MG 24 hr capsule, Take 1 capsule by mouth daily., Disp: , Rfl:    VITAMIN D PO, Take by mouth daily., Disp: , Rfl:    ziprasidone (GEODON) 40 MG capsule, Take 40 mg by mouth every evening. , Disp: , Rfl:    methylphenidate (RITALIN) 5 MG tablet, Take 5 mg by mouth 2 (two) times daily. (Patient not taking: Reported on 04/14/2022), Disp: , Rfl:   Physical exam:  Vitals:   04/14/22 1110  BP: (!) 135/90  Pulse: 94  Resp: 18  Temp: 98.2 F (36.8 C)  SpO2: 100%  Weight: 180 lb (81.6 kg)   Physical Exam Constitutional:      General: She is not in acute distress. Cardiovascular:     Rate and Rhythm: Normal rate and regular rhythm.     Heart sounds: Normal heart sounds.  Pulmonary:     Effort: Pulmonary effort is normal.     Breath sounds: Normal breath sounds.  Skin:    General: Skin is warm and dry.  Neurological:     Mental Status: She is alert and oriented to person, place, and time.    Breast exam was performed in seated and lying down position. Patient is status post left lumpectomy with a well-healed surgical scar. No evidence of any palpable masses. No evidence of axillary adenopathy. No evidence of any palpable masses or lumps in the right breast. No evidence of right axillary adenopathy      Latest Ref Rng & Units 04/14/2021    9:28 AM  CMP  Glucose 65 - 99 mg/dL 104   BUN 7 - 25 mg/dL 11   Creatinine 0.50 - 1.03 mg/dL 0.84   Sodium 135 - 146 mmol/L 143   Potassium 3.5 - 5.3 mmol/L 4.4   Chloride 98 - 110 mmol/L 107   CO2 20 - 32 mmol/L 22   Calcium 8.6 - 10.4 mg/dL 9.4   Total Protein 6.1 - 8.1 g/dL 6.5   Total Bilirubin 0.2 - 1.2 mg/dL 0.4   AST 10 - 35 U/L 16   ALT 6 - 29 U/L 13       Latest Ref Rng & Units 04/14/2021    9:28 AM  CBC  WBC 3.8 - 10.8  Thousand/uL 7.9   Hemoglobin 11.7 - 15.5 g/dL 14.4   Hematocrit 35.0 - 45.0 % 42.2   Platelets 140 - 400 Thousand/uL 347     No images are attached to the encounter.  MM 3D SCREEN BREAST BILATERAL  Result Date: 04/09/2022 CLINICAL DATA:  Screening. EXAM: DIGITAL SCREENING BILATERAL MAMMOGRAM WITH TOMOSYNTHESIS AND CAD TECHNIQUE: Bilateral screening digital craniocaudal and mediolateral oblique mammograms were obtained. Bilateral screening digital breast tomosynthesis was performed. The images were evaluated with computer-aided detection. COMPARISON:  Previous exam(s). ACR Breast Density Category b: There are scattered areas of fibroglandular density. FINDINGS: There are no findings suspicious for malignancy. IMPRESSION: No mammographic evidence of malignancy. A result letter of this screening mammogram will be mailed directly to the patient. RECOMMENDATION: Screening mammogram in one year. (Code:SM-B-01Y) BI-RADS CATEGORY  1: Negative. Electronically Signed   By: Lajean Manes M.D.   On: 04/09/2022 08:42     Assessment and  plan- Patient is a 61 y.o. female with history of stage I ER positive left breast cancer status postlumpectomy and adjuvant radiation treatment.  She is currently on Arimidex and this is a routine follow-up visit  Patient is tolerating Arimidex well and will continue taking it until February 2025 along with calcium and vitamin D.  I will see her back in 6 months no labs.  She will need a bone density scan in March 2024.  Clinically she is doing well with no signs and symptoms of recurrence based on today's exam.  Her recent mammogram was also negative for malignancy.   Visit Diagnosis 1. Encounter for follow-up surveillance of breast cancer   2. Visit for monitoring Arimidex therapy      Dr. Randa Evens, MD, MPH [facility] [phone] 04/14/2022 3:32 PM

## 2022-04-14 NOTE — Progress Notes (Signed)
Patient here for oncology follow-up appointment, concerns of occasional chest pain    

## 2022-06-13 ENCOUNTER — Other Ambulatory Visit: Payer: Self-pay | Admitting: Internal Medicine

## 2022-06-15 ENCOUNTER — Other Ambulatory Visit: Payer: Self-pay | Admitting: Oncology

## 2022-06-15 DIAGNOSIS — Z08 Encounter for follow-up examination after completed treatment for malignant neoplasm: Secondary | ICD-10-CM

## 2022-08-30 ENCOUNTER — Other Ambulatory Visit: Payer: Medicare Other

## 2022-08-31 ENCOUNTER — Ambulatory Visit
Admission: RE | Admit: 2022-08-31 | Discharge: 2022-08-31 | Disposition: A | Discharge status: Home / Self Care | Payer: Medicare Other | Source: Ambulatory Visit | Attending: Oncology | Admitting: Oncology

## 2022-08-31 DIAGNOSIS — Z853 Personal history of malignant neoplasm of breast: Secondary | ICD-10-CM | POA: Diagnosis present

## 2022-08-31 DIAGNOSIS — Z08 Encounter for follow-up examination after completed treatment for malignant neoplasm: Secondary | ICD-10-CM | POA: Diagnosis not present

## 2022-08-31 DIAGNOSIS — Z79811 Long term (current) use of aromatase inhibitors: Secondary | ICD-10-CM | POA: Diagnosis present

## 2022-08-31 DIAGNOSIS — Z5181 Encounter for therapeutic drug level monitoring: Secondary | ICD-10-CM | POA: Diagnosis present

## 2022-08-31 DIAGNOSIS — Z780 Asymptomatic menopausal state: Secondary | ICD-10-CM | POA: Diagnosis present

## 2022-09-28 ENCOUNTER — Other Ambulatory Visit: Payer: Self-pay | Admitting: Oncology

## 2022-09-28 DIAGNOSIS — Z08 Encounter for follow-up examination after completed treatment for malignant neoplasm: Secondary | ICD-10-CM

## 2022-10-13 ENCOUNTER — Inpatient Hospital Stay: Payer: Medicare Other | Admitting: Oncology

## 2022-10-21 ENCOUNTER — Ambulatory Visit: Payer: Medicare Other | Admitting: Radiation Oncology

## 2022-12-25 ENCOUNTER — Other Ambulatory Visit: Payer: Self-pay | Admitting: Oncology

## 2022-12-25 DIAGNOSIS — Z08 Encounter for follow-up examination after completed treatment for malignant neoplasm: Secondary | ICD-10-CM

## 2023-01-17 ENCOUNTER — Ambulatory Visit (INDEPENDENT_AMBULATORY_CARE_PROVIDER_SITE_OTHER): Payer: Medicare Other | Admitting: Gastroenterology

## 2023-01-17 ENCOUNTER — Encounter: Payer: Self-pay | Admitting: Gastroenterology

## 2023-01-17 VITALS — BP 145/90 | HR 87 | Temp 98.1°F | Wt 178.0 lb

## 2023-01-17 DIAGNOSIS — R6881 Early satiety: Secondary | ICD-10-CM

## 2023-01-17 NOTE — Progress Notes (Signed)
Gastroenterology Consultation  Referring Provider:     Corky Downs, MD Primary Care Physician:  Corky Downs, MD Primary Gastroenterologist:  Dr. Servando Snare     Reason for Consultation:     Abdominal pain        HPI:   Tiffany Mathews is a 62 y.o. y/o female referred for consultation & management of abdominal pain by Dr. Corky Downs, MD. this patient comes in today with a report of epigastric pain.  The patient states the abdominal pain is usually after she eats.  She states that she usually has large meals which exacerbate the pain but the pain has been better since she has been eating multiple small meals throughout the day.  There is no report of any unexplained weight loss.  She also denies any black stools or bloody stools.  The patient is not due for colonoscopy until next year.  The patient has not had this epigastric pain in the past.  Past Medical History:  Diagnosis Date   Anxiety    Arthritis    Bipolar 1 disorder ([facility])    Breast cancer ([facility])    Depression    Fibromyalgia    GERD (gastroesophageal reflux disease)    Hidradenitis suppurativa    History of hiatal hernia    Hydradenitis    Hyperlipidemia    Hypertension    Multiple thyroid nodules    benign   Sleep apnea    has CPAP, doesn't use    Past Surgical History:  Procedure Laterality Date   ABLATION  04/2011   BREAST BIOPSY Right 2009   INVASIVE MAMMARY CARCINOMA, GRADE 1   BREAST LUMPECTOMY Left 04/19/2018   INVASIVE MAMMARY CARCINOMA, GRADE 1   CESAREAN SECTION     x3   CHOLECYSTECTOMY     COLONOSCOPY WITH PROPOFOL N/A 03/09/2019   Procedure: COLONOSCOPY WITH PROPOFOL;  Surgeon: Midge Minium, MD;  Location: [facility];  Service: Endoscopy;  Laterality: N/A;  sleep apnea   GALLBLADDER SURGERY     LYSIS OF ADHESION  05/27/2016   Procedure: LYSIS OF ADHESION;  Surgeon: Ricarda Frame, MD;  Location: [facility];  Service: General;;   PARTIAL MASTECTOMY WITH NEEDLE LOCALIZATION Left 04/19/2018    Procedure: PARTIAL MASTECTOMY WITH NEEDLE LOCALIZATION;  Surgeon: Carolan Shiver, MD;  Location: [facility];  Service: General;  Laterality: Left;   ROBOTIC ASSISTED LAPAROSCOPIC CHOLECYSTECTOMY N/A 05/27/2016   Procedure: ROBOTIC ASSISTED LAPAROSCOPIC CHOLECYSTECTOMY;  Surgeon: Ricarda Frame, MD;  Location: [facility];  Service: General;  Laterality: N/A;   SENTINEL NODE BIOPSY Left 04/19/2018   Procedure: SENTINEL NODE BIOPSY;  Surgeon: Carolan Shiver, MD;  Location: [facility];  Service: General;  Laterality: Left;   SHOULDER ARTHROSCOPY  2000   TONSILLECTOMY Right 09/19/2019   Procedure: PARTIAL VS TOTAL TONSILLECTOMY;  Surgeon: Bud Face, MD;  Location: [facility];  Service: ENT;  Laterality: Right;   TUBAL LIGATION      Prior to Admission medications   Medication Sig Start Date End Date Taking? Authorizing Provider  amitriptyline (ELAVIL) 25 MG tablet  02/17/20  Yes [provider]  aspirin EC 81 MG tablet Take 81 mg by mouth daily.    Yes [provider]  atorvastatin (LIPITOR) 40 MG tablet Take 1 tablet by mouth once daily 03/18/22  Yes Masoud, Renda Rolls, MD  clonazePAM (KLONOPIN) 0.5 MG tablet Take 0.5 mg by mouth 2 (two) times daily.   Yes [provider]  diltiazem (CARDIZEM CD) 180 MG 24 hr  capsule Take 1 capsule by mouth once daily 03/29/22  Yes Masoud, Renda Rolls, MD  fluticasone (FLONASE) 50 MCG/ACT nasal spray Place 1 spray into both nostrils 2 (two) times daily. 08/05/19  Yes [provider]  gabapentin (NEURONTIN) 300 MG capsule Take 300 mg by mouth 3 (three) times daily.  10/05/17  Yes [provider]  guanFACINE (TENEX) 1 MG tablet Take 1 mg by mouth every morning. 10/06/22  Yes [provider]  loratadine (CLARITIN) 10 MG tablet Take 10 mg by mouth daily as needed for allergies. 12/21/17  Yes [provider]  losartan (COZAAR) 100 MG tablet TAKE 1 TABLET BY MOUTH EVERY 12 HOURS 06/14/22  Yes Masoud,  Renda Rolls, MD  methocarbamol (ROBAXIN) 500 MG tablet Take by mouth. 07/17/20  Yes [provider]  Multiple Vitamins-Minerals (MULTIVITAMIN WITH MINERALS) tablet Take 1 tablet by mouth daily.   Yes [provider]  pantoprazole (PROTONIX) 40 MG tablet Take 1 tablet (40 mg total) by mouth 2 (two) times daily. MUST SCHEDULE OFFICE VISIT 10/20/21  Yes Corky Downs, MD  traZODone (DESYREL) 100 MG tablet Take 100 mg by mouth at bedtime as needed (sleep).  09/15/17  Yes [provider]  venlafaxine XR (EFFEXOR-XR) 75 MG 24 hr capsule Take 1 capsule by mouth daily. 08/27/19  Yes [provider]  VITAMIN D PO Take by mouth daily.   Yes [provider]  ziprasidone (GEODON) 40 MG capsule Take 40 mg by mouth every evening.  10/12/17  Yes [provider]    Family History  Problem Relation Age of Onset   Thyroid cancer Mother        dx 35s; currently 73   Hypertension Mother    Diabetes Mother    Breast cancer Maternal Aunt 74       deceased 81s   Alzheimer's disease Father        deceased 38   Stroke Father    Drug abuse Sister        deceased 44   Diabetes Brother    Hypertension Brother    Heart attack Maternal Grandfather    Diabetes Brother    Hypertension Brother    Alcohol abuse Brother    Schizophrenia Brother    Stomach cancer Paternal Aunt        age at dx unknown   Stomach cancer Paternal Uncle        age at dx unknown   Cancer Other        daughter of sister who died at 75; breast ca in 12s and ovarian cancer at 63; currently 58s     Social History   Tobacco Use   Smoking status: Every Day    Current packs/day: 0.50    Average packs/day: 0.5 packs/day for 28.0 years (14.0 ttl pk-yrs)    Types: Cigarettes   Smokeless tobacco: Never   Tobacco comments:    started around 1993  Vaping Use   Vaping status: Former  Substance Use Topics   Alcohol use: No   Drug use: No    Allergies as of 01/17/2023 - Review Complete  01/17/2023  Allergen Reaction Noted   Lamictal [lamotrigine] Rash and Other (See Comments) 03/20/2014   Depakote [divalproex sodium] Other (See Comments) 03/20/2014   Other Other (See Comments) 12/19/2019   Lithium Other (See Comments) 03/20/2014    Review of Systems:    All systems reviewed and negative except where noted in HPI.   Physical Exam:  BP (!) 145/90 (  BP Location: Left Arm, Patient Position: Sitting, Cuff Size: Normal)   Pulse 87   Temp 98.1 F (36.7 C) (Oral)   Wt 178 lb (80.7 kg)   BMI 29.62 kg/m  No LMP recorded. Patient has had an ablation. General:   Alert,  Well-developed, well-nourished, pleasant and cooperative in NAD Head:  Normocephalic and atraumatic. Eyes:  Sclera clear, no icterus.   Conjunctiva pink. Ears:  Normal auditory acuity. Neck:  Supple; no masses or thyromegaly. Lungs:  Respirations even and unlabored.  Clear throughout to auscultation.   No wheezes, crackles, or rhonchi. No acute distress. Heart:  Regular rate and rhythm; no murmurs, clicks, rubs, or gallops. Abdomen:  Normal bowel sounds.  No bruits.  Soft, non-tender and non-distended without masses, hepatosplenomegaly or hernias noted.  No guarding or rebound tenderness.  Negative Carnett sign.   Rectal:  Deferred.  Pulses:  Normal pulses noted. Extremities:  No clubbing or edema.  No cyanosis. Neurologic:  Alert and oriented x3;  grossly normal neurologically. Skin:  Intact without significant lesions or rashes.  No jaundice. Lymph Nodes:  No significant cervical adenopathy. Psych:  Alert and cooperative. Normal mood and affect.  Imaging Studies: No results found.  Assessment and Plan:   Tiffany Mathews is a 62 y.o. y/o female who comes in with epigastric pain which usually occurs after she has eaten her regular amount of food so she has cut down on the food that she has been eating.  The patient also feels like she gets full fast.  The patient will be set up for an upper endoscopy due  to her dyspepsia and early satiety with abdominal pain.  The patient will continue her pantoprazole.  The patient has been explained the plan and agrees with it.    Midge Minium, MD. Clementeen Graham    Note: This dictation was prepared with Dragon dictation along with smaller phrase technology. Any transcriptional errors that result from this process are unintentional.

## 2023-02-02 LAB — BASIC METABOLIC PANEL WITH GFR
BUN: 11 (ref 4–21)
CO2: 27 — AB (ref 13–22)
Chloride: 103 (ref 99–108)
Creatinine: 0.8 (ref 0.5–1.1)
Glucose: 99
Potassium: 3.9 meq/L (ref 3.5–5.1)
Sodium: 140 (ref 137–147)

## 2023-02-02 LAB — LIPID PANEL
Cholesterol: 158 (ref 0–200)
HDL: 45 (ref 35–70)
LDL Cholesterol: 81
Triglycerides: 230 — AB (ref 40–160)

## 2023-02-02 LAB — TSH: TSH: 2.4 (ref 0.41–5.90)

## 2023-02-02 LAB — CBC AND DIFFERENTIAL
HCT: 42 (ref 36–46)
Hemoglobin: 14.2 (ref 12.0–16.0)
Neutrophils Absolute: 4448
Platelets: 368 K/uL (ref 150–400)
WBC: 8

## 2023-02-02 LAB — HEMOGLOBIN A1C: Hemoglobin A1C: 5.9

## 2023-02-02 LAB — CBC: RBC: 4.4 (ref 3.87–5.11)

## 2023-02-02 LAB — HEPATIC FUNCTION PANEL
ALT: 11 U/L (ref 7–35)
AST: 13 (ref 13–35)
Alkaline Phosphatase: 59 (ref 25–125)
Bilirubin, Total: 0.4

## 2023-02-02 LAB — COMPREHENSIVE METABOLIC PANEL WITH GFR
Albumin: 4.4 (ref 3.5–5.0)
Calcium: 9.5 (ref 8.7–10.7)
eGFR: 88

## 2023-02-02 LAB — PROTEIN / CREATININE RATIO, URINE: Albumin, U: 1.6

## 2023-02-09 ENCOUNTER — Telehealth: Payer: Self-pay | Admitting: Gastroenterology

## 2023-02-09 NOTE — Telephone Encounter (Signed)
Patient called in reschedule procedure to for anytime after Oct.9.24.

## 2023-02-10 NOTE — Telephone Encounter (Signed)
Patient left a message on voicemail stating she would like a call back today about reschedule her EGD with Dr. Servando Snare She can not do 02/28/2023 and would like to get on the schedule for October before it gets full.

## 2023-02-11 NOTE — Telephone Encounter (Signed)
[place] has already rescheduled pt

## 2023-03-07 ENCOUNTER — Encounter: Payer: Self-pay | Admitting: Oncology

## 2023-03-07 ENCOUNTER — Inpatient Hospital Stay: Payer: Medicare Other | Attending: Oncology | Admitting: Oncology

## 2023-03-07 VITALS — BP 134/71 | HR 93 | Temp 97.3°F | Resp 18 | Ht 65.0 in | Wt 174.2 lb

## 2023-03-07 DIAGNOSIS — F17210 Nicotine dependence, cigarettes, uncomplicated: Secondary | ICD-10-CM | POA: Diagnosis not present

## 2023-03-07 DIAGNOSIS — Z923 Personal history of irradiation: Secondary | ICD-10-CM | POA: Diagnosis not present

## 2023-03-07 DIAGNOSIS — Z853 Personal history of malignant neoplasm of breast: Secondary | ICD-10-CM

## 2023-03-07 DIAGNOSIS — Z170 Estrogen receptor positive status [ER+]: Secondary | ICD-10-CM | POA: Insufficient documentation

## 2023-03-07 DIAGNOSIS — Z9012 Acquired absence of left breast and nipple: Secondary | ICD-10-CM | POA: Diagnosis not present

## 2023-03-07 DIAGNOSIS — Z5181 Encounter for therapeutic drug level monitoring: Secondary | ICD-10-CM | POA: Diagnosis not present

## 2023-03-07 DIAGNOSIS — Z79811 Long term (current) use of aromatase inhibitors: Secondary | ICD-10-CM | POA: Insufficient documentation

## 2023-03-07 DIAGNOSIS — C50412 Malignant neoplasm of upper-outer quadrant of left female breast: Secondary | ICD-10-CM | POA: Diagnosis present

## 2023-03-07 DIAGNOSIS — Z08 Encounter for follow-up examination after completed treatment for malignant neoplasm: Secondary | ICD-10-CM

## 2023-03-07 DIAGNOSIS — Z7982 Long term (current) use of aspirin: Secondary | ICD-10-CM | POA: Diagnosis not present

## 2023-03-07 DIAGNOSIS — Z79899 Other long term (current) drug therapy: Secondary | ICD-10-CM | POA: Insufficient documentation

## 2023-03-07 MED ORDER — ANASTROZOLE 1 MG PO TABS: 1.0000 mg | ORAL_TABLET | Freq: Every day | ORAL | 1 refills | Status: DC

## 2023-03-07 NOTE — Progress Notes (Signed)
Hematology/Oncology Consult note [facility]  Telephone:([phone] [phone]  Patient Care Team: Corky Downs, MD as PCP - General (Internal Medicine) Carmina Miller, MD as Radiation Oncologist (Radiation Oncology)   Name of the patient: Tiffany Mathews  [id number]  04-18-1961   Date of visit: 03/07/23  Diagnosis- history of stage I ER positive left breast cancer   Chief complaint/ Reason for visit-routine follow-up of breast cancer on Arimidex  Heme/Onc history: patient is a 62 year old Hispanic female who underwent bilateral screening mammogram in September 2019 which showed a suspicious mass in the left breast 2 o'clock position measuring 0.8 x 0.5 x 0.8 cm in size.  Approximately 1.2 cm lateral to this mass were 2-3 tightly clustered hypoechoic nodules together measuring 29 x 0.2 x 0.8 cm.  There was also an other hypoechoic area which was suggestive of fibrocystic tissue.  Several other tiny hypoechoic masses were noted with prominent ducts which could represent fibrocystic change.  DCIS is also a concern.  Ultrasound of the left axilla did not reveal any pathologic adenopathy.  Patient underwent core biopsy of the dominant mass as well as the cluster of nodules immediately adjacent to it.  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  Biopsy of the second breast mass was negative for atypia and malignancy.     MRI of bilateral breasts on 04/11/2018 showed 0.8 cm biopsy-proven malignancy in the upper outer quadrant of the left breast.  Second mass in the upper outer quadrant of the left breast which is known and pathology to be benign and enhances to a lesser degree than does the biopsy malignancy.   Patient underwent lumpectomy and sentinel lymph node biopsy on 04/19/2018.  Final pathology showed invasive mammary carcinoma, grade 1, 10 mm. 2 sentinel lymph nodes  were negative for malignancy.  Negative margins.  ER PR greater than 90% positive.  HER-2/neu negative.  pT1b pN0.  Patient completed adjuvant radiation treatment and started Arimidex sometime in February 2020.   Patient did undergo genetic testing which did not reveal any mutation.  Interval history-patient is doing well and denies any breast concerns at this time.  Denies any changes in her appetite or weight.  Her heart rate has been running highI between 90s t0 100s for the last couple of weeks and she is concerned about the same.  ECOG PS- 1 Pain scale- 0   Review of systems- Review of Systems  Constitutional:  Negative for chills, fever, malaise/fatigue and weight loss.  HENT:  Negative for congestion, ear discharge and nosebleeds.   Eyes:  Negative for blurred vision.  Respiratory:  Negative for cough, hemoptysis, sputum production, shortness of breath and wheezing.   Cardiovascular:  Negative for chest pain, palpitations, orthopnea and claudication.  Gastrointestinal:  Negative for abdominal pain, blood in stool, constipation, diarrhea, heartburn, melena, nausea and vomiting.  Genitourinary:  Negative for dysuria, flank pain, frequency, hematuria and urgency.  Musculoskeletal:  Negative for back pain, joint pain and myalgias.  Skin:  Negative for rash.  Neurological:  Negative for dizziness, tingling, focal weakness, seizures, weakness and headaches.  Endo/Heme/Allergies:  Does not bruise/bleed easily.  Psychiatric/Behavioral:  Negative for depression and suicidal ideas. The patient does not have insomnia.       Allergies  Allergen Reactions   Lamictal [Lamotrigine] Rash and Other (See Comments)   Depakote [Divalproex Sodium] Other (See Comments)  Severe hair loss   Other Other (See Comments)    Flowers and pollen   Lithium Other (See Comments)    Unknown; "It just made me feel bad."     Past Medical History:  Diagnosis Date   Anxiety    Arthritis    Bipolar 1  disorder (HCC)    Breast cancer (HCC)    Depression    Fibromyalgia    GERD (gastroesophageal reflux disease)    Hidradenitis suppurativa    History of hiatal hernia    Hydradenitis    Hyperlipidemia    Hypertension    Multiple thyroid nodules    benign   Sleep apnea    has CPAP, doesn't use     Past Surgical History:  Procedure Laterality Date   ABLATION  04/2011   BREAST BIOPSY Right 2009   INVASIVE MAMMARY CARCINOMA, GRADE 1   BREAST LUMPECTOMY Left 04/19/2018   INVASIVE MAMMARY CARCINOMA, GRADE 1   CESAREAN SECTION     x3   CHOLECYSTECTOMY     COLONOSCOPY WITH PROPOFOL N/A 03/09/2019   Procedure: COLONOSCOPY WITH PROPOFOL;  Surgeon: Midge Minium, MD;  Location: [facility];  Service: Endoscopy;  Laterality: N/A;  sleep apnea   GALLBLADDER SURGERY     LYSIS OF ADHESION  05/27/2016   Procedure: LYSIS OF ADHESION;  Surgeon: Ricarda Frame, MD;  Location: [facility];  Service: General;;   PARTIAL MASTECTOMY WITH NEEDLE LOCALIZATION Left 04/19/2018   Procedure: PARTIAL MASTECTOMY WITH NEEDLE LOCALIZATION;  Surgeon: Carolan Shiver, MD;  Location: [facility];  Service: General;  Laterality: Left;   ROBOTIC ASSISTED LAPAROSCOPIC CHOLECYSTECTOMY N/A 05/27/2016   Procedure: ROBOTIC ASSISTED LAPAROSCOPIC CHOLECYSTECTOMY;  Surgeon: Ricarda Frame, MD;  Location: [facility];  Service: General;  Laterality: N/A;   SENTINEL NODE BIOPSY Left 04/19/2018   Procedure: SENTINEL NODE BIOPSY;  Surgeon: Carolan Shiver, MD;  Location: [facility];  Service: General;  Laterality: Left;   SHOULDER ARTHROSCOPY  2000   TONSILLECTOMY Right 09/19/2019   Procedure: PARTIAL VS TOTAL TONSILLECTOMY;  Surgeon: Bud Face, MD;  Location: [facility];  Service: ENT;  Laterality: Right;   TUBAL LIGATION      Social History   Socioeconomic History   Marital status: Divorced    Spouse name: Not on file   Number of children: Not on file   Years of education: Not on file    Highest education level: Not on file  Occupational History   Not on file  Tobacco Use   Smoking status: Every Day    Current packs/day: 0.50    Average packs/day: 0.5 packs/day for 28.0 years (14.0 ttl pk-yrs)    Types: Cigarettes   Smokeless tobacco: Never   Tobacco comments:    started around 1993  Vaping Use   Vaping status: Former  Substance and Sexual Activity   Alcohol use: No   Drug use: No   Sexual activity: Not Currently  Other Topics Concern   Not on file  Social History Narrative   Not on file   Social Determinants of Health   Financial Resource Strain: Low Risk  (04/09/2021)   Overall Financial Resource Strain (CARDIA)    Difficulty of Paying Living Expenses: Not hard at all  Food Insecurity: No Food Insecurity (04/09/2021)   Hunger Vital Sign    Worried About Running Out of Food in the Last Year: Never true    Ran Out of Food in the Last Year: Never true  Transportation Needs: No Transportation  Needs (04/09/2021)   PRAPARE - Administrator, Civil Service (Medical): No    Lack of Transportation (Non-Medical): No  Physical Activity: Unknown (04/09/2021)   Exercise Vital Sign    Days of Exercise per Week: Not on file    Minutes of Exercise per Session: 30 min  Stress: Stress Concern Present (04/09/2021)   [employer] of Occupational Health - Occupational Stress Questionnaire    Feeling of Stress : To some extent  Social Connections: Socially Isolated (04/09/2021)   Social Connection and Isolation Panel [NHANES]    Frequency of Communication with Friends and Family: More than three times a week    Frequency of Social Gatherings with Friends and Family: More than three times a week    Attends Religious Services: Never    Database administrator or Organizations: No    Attends Banker Meetings: Never    Marital Status: Divorced  Catering manager Violence: Not At Risk (04/09/2021)   Humiliation, Afraid, Rape, and Kick questionnaire     Fear of Current or Ex-Partner: No    Emotionally Abused: No    Physically Abused: No    Sexually Abused: No    Family History  Problem Relation Age of Onset   Thyroid cancer Mother        dx 61s; currently 72   Hypertension Mother    Diabetes Mother    Breast cancer Maternal Aunt 69       deceased 33s   Alzheimer's disease Father        deceased 62   Stroke Father    Drug abuse Sister        deceased 28   Diabetes Brother    Hypertension Brother    Heart attack Maternal Grandfather    Diabetes Brother    Hypertension Brother    Alcohol abuse Brother    Schizophrenia Brother    Stomach cancer Paternal Aunt        age at dx unknown   Stomach cancer Paternal Uncle        age at dx unknown   Cancer Other        daughter of sister who died at 23; breast ca in 30s and ovarian cancer at 76; currently 20s     Current Outpatient Medications:    amitriptyline (ELAVIL) 25 MG tablet, , Disp: , Rfl:    anastrozole (ARIMIDEX) 1 MG tablet, Take 1 tablet (1 mg total) by mouth daily., Disp: 90 tablet, Rfl: 1   aspirin EC 81 MG tablet, Take 81 mg by mouth daily. , Disp: , Rfl:    atorvastatin (LIPITOR) 40 MG tablet, Take 1 tablet by mouth once daily, Disp: 90 tablet, Rfl: 0   clonazePAM (KLONOPIN) 0.5 MG tablet, Take 0.5 mg by mouth 2 (two) times daily., Disp: , Rfl:    diltiazem (CARDIZEM CD) 180 MG 24 hr capsule, Take 1 capsule by mouth once daily, Disp: 90 capsule, Rfl: 0   fluticasone (FLONASE) 50 MCG/ACT nasal spray, Place 1 spray into both nostrils 2 (two) times daily., Disp: , Rfl:    gabapentin (NEURONTIN) 300 MG capsule, Take 300 mg by mouth 3 (three) times daily. , Disp: , Rfl: 1   guanFACINE (TENEX) 1 MG tablet, Take 1 mg by mouth every morning., Disp: , Rfl:    loratadine (CLARITIN) 10 MG tablet, Take 10 mg by mouth daily as needed for allergies., Disp: , Rfl: 3   losartan (COZAAR) 100 MG tablet, TAKE  1 TABLET BY MOUTH EVERY 12 HOURS, Disp: 180 tablet, Rfl: 0   methocarbamol  (ROBAXIN) 500 MG tablet, Take by mouth., Disp: , Rfl:    Multiple Vitamins-Minerals (MULTIVITAMIN WITH MINERALS) tablet, Take 1 tablet by mouth daily., Disp: , Rfl:    pantoprazole (PROTONIX) 40 MG tablet, Take 1 tablet (40 mg total) by mouth 2 (two) times daily. MUST SCHEDULE OFFICE VISIT, Disp: 180 tablet, Rfl: 3   traZODone (DESYREL) 100 MG tablet, Take 100 mg by mouth at bedtime as needed (sleep). , Disp: , Rfl: 1   venlafaxine XR (EFFEXOR-XR) 75 MG 24 hr capsule, Take 1 capsule by mouth daily., Disp: , Rfl:    VITAMIN D PO, Take by mouth daily., Disp: , Rfl:    ziprasidone (GEODON) 40 MG capsule, Take 40 mg by mouth every evening. , Disp: , Rfl:   Physical exam:  Vitals:   03/07/23 0832  BP: 134/71  Pulse: 93  Resp: 18  Temp: (!) 97.3 F (36.3 C)  TempSrc: Tympanic  SpO2: 98%  Weight: 174 lb 3.2 oz (79 kg)  Height: 5\' 5"  (1.651 m)   Physical Exam Cardiovascular:     Rate and Rhythm: Regular rhythm. Tachycardia present.     Heart sounds: Normal heart sounds.  Pulmonary:     Effort: Pulmonary effort is normal.     Breath sounds: Normal breath sounds.  Skin:    General: Skin is warm and dry.  Neurological:     Mental Status: She is alert and oriented to person, place, and time.    Breast exam was performed in seated and lying down position. Patient is status post left lumpectomy with a well-healed surgical scar. No evidence of any palpable masses. No evidence of axillary adenopathy. No evidence of any palpable masses or lumps in the right breast. No evidence of right axillary adenopathy      Latest Ref Rng & Units 04/14/2021    9:28 AM  CMP  Glucose 65 - 99 mg/dL 161   BUN 7 - 25 mg/dL 11   Creatinine 0.96 - 1.03 mg/dL 0.45   Sodium 409 - 811 mmol/L 143   Potassium 3.5 - 5.3 mmol/L 4.4   Chloride 98 - 110 mmol/L 107   CO2 20 - 32 mmol/L 22   Calcium 8.6 - 10.4 mg/dL 9.4   Total Protein 6.1 - 8.1 g/dL 6.5   Total Bilirubin 0.2 - 1.2 mg/dL 0.4   AST 10 - 35 U/L 16    ALT 6 - 29 U/L 13       Latest Ref Rng & Units 04/14/2021    9:28 AM  CBC  WBC 3.8 - 10.8 Thousand/uL 7.9   Hemoglobin 11.7 - 15.5 g/dL 91.4   Hematocrit 78.2 - 45.0 % 42.2   Platelets 140 - 400 Thousand/uL 347     Assessment and plan- Patient is a 62 y.o. female  with history of stage I ER positive left breast cancer status postlumpectomy and adjuvant radiation treatment.  She is currently on Arimidex and this is a routine follow-up visit  Clinically patient is doing well with no concerning signs and symptoms of recurrence based on today's exam.  I am renewing her Arimidex and she will complete 5 years of endocrine therapy in February 2025.  She will continue prophylactic calcium and vitamin D.  Bone densityScan from March 2024 was within normal limits.  I will see her back in 6 months no labs and following that I will see  her on a yearly basis  Sinus tachycardia:Patient has a heart rate between 80s to 90s even during her prior office visits here.  I have asked her to discuss this further with her PCP.  She denies any chest pain or shortness of breath at this time   Visit Diagnosis 1. Encounter for follow-up surveillance of breast cancer   2. Visit for monitoring Arimidex therapy      Dr. Owens Shark, MD, MPH [facility] [phone] 03/07/2023 12:43 PM

## 2023-03-23 DIAGNOSIS — M797 Fibromyalgia: Secondary | ICD-10-CM | POA: Insufficient documentation

## 2023-03-24 ENCOUNTER — Encounter: Payer: Self-pay | Admitting: Gastroenterology

## 2023-03-24 ENCOUNTER — Other Ambulatory Visit: Payer: Self-pay | Admitting: General Surgery

## 2023-03-24 DIAGNOSIS — Z1231 Encounter for screening mammogram for malignant neoplasm of breast: Secondary | ICD-10-CM

## 2023-04-01 ENCOUNTER — Encounter: Payer: Self-pay | Admitting: Gastroenterology

## 2023-04-01 ENCOUNTER — Other Ambulatory Visit: Payer: Self-pay

## 2023-04-01 ENCOUNTER — Ambulatory Visit
Admission: RE | Admit: 2023-04-01 | Discharge: 2023-04-01 | Disposition: A | Discharge status: Home / Self Care | Payer: Medicare Other | Attending: Gastroenterology | Admitting: Gastroenterology

## 2023-04-01 ENCOUNTER — Encounter
Admission: RE | Disposition: A | Discharge status: Home / Self Care | Payer: Self-pay | Source: Home / Self Care | Attending: Gastroenterology

## 2023-04-01 ENCOUNTER — Ambulatory Visit: Payer: Medicare Other | Admitting: Anesthesiology

## 2023-04-01 DIAGNOSIS — K449 Diaphragmatic hernia without obstruction or gangrene: Secondary | ICD-10-CM | POA: Diagnosis not present

## 2023-04-01 DIAGNOSIS — K219 Gastro-esophageal reflux disease without esophagitis: Secondary | ICD-10-CM | POA: Insufficient documentation

## 2023-04-01 DIAGNOSIS — F17210 Nicotine dependence, cigarettes, uncomplicated: Secondary | ICD-10-CM | POA: Diagnosis not present

## 2023-04-01 DIAGNOSIS — R1013 Epigastric pain: Secondary | ICD-10-CM | POA: Diagnosis not present

## 2023-04-01 DIAGNOSIS — R6881 Early satiety: Secondary | ICD-10-CM | POA: Diagnosis present

## 2023-04-01 HISTORY — PX: ESOPHAGOGASTRODUODENOSCOPY (EGD) WITH PROPOFOL: SHX5813

## 2023-04-01 SURGERY — ESOPHAGOGASTRODUODENOSCOPY (EGD) WITH PROPOFOL
Anesthesia: General | Site: Mouth

## 2023-04-01 MED ORDER — SODIUM CHLORIDE 0.9 % IV SOLN: INTRAVENOUS | Status: DC

## 2023-04-01 MED ORDER — PROPOFOL 10 MG/ML IV BOLUS
INTRAVENOUS | Status: DC | PRN
  Administered 2023-04-01: 90 mg via INTRAVENOUS

## 2023-04-01 MED ORDER — STERILE WATER FOR IRRIGATION IR SOLN
Status: DC | PRN
  Administered 2023-04-01: 1

## 2023-04-01 MED ORDER — PROPOFOL 10 MG/ML IV BOLUS
INTRAVENOUS | Status: AC
  Filled 2023-04-01: qty 20

## 2023-04-01 MED ORDER — LIDOCAINE HCL (CARDIAC) PF 100 MG/5ML IV SOSY
PREFILLED_SYRINGE | INTRAVENOUS | Status: DC | PRN
  Administered 2023-04-01: 60 mg via INTRAVENOUS

## 2023-04-01 SURGICAL SUPPLY — 32 items

## 2023-04-01 NOTE — H&P (Signed)
Midge Minium, MD [facility] [address] [place], [place] [address] [phone] Fax : [phone]  Primary Care Physician:  Corky Downs, MD Primary Gastroenterologist:  Dr. Servando Snare  Pre-Procedure History & Physical: HPI:  Tiffany Mathews is a 62 y.o. female is here for an endoscopy.   Past Medical History:  Diagnosis Date   Anxiety    Arthritis    Bipolar 1 disorder (HCC)    Breast cancer (HCC)    Depression    Fibromyalgia    GERD (gastroesophageal reflux disease)    Hidradenitis suppurativa    History of hiatal hernia    Hydradenitis    Hyperlipidemia    Hypertension    Multiple thyroid nodules    benign   Sleep apnea    No CPAP    Past Surgical History:  Procedure Laterality Date   ABLATION  04/2011   BREAST BIOPSY Right 2009   INVASIVE MAMMARY CARCINOMA, GRADE 1   BREAST LUMPECTOMY Left 04/19/2018   INVASIVE MAMMARY CARCINOMA, GRADE 1   CESAREAN SECTION     x3   CHOLECYSTECTOMY     COLONOSCOPY WITH PROPOFOL N/A 03/09/2019   Procedure: COLONOSCOPY WITH PROPOFOL;  Surgeon: Midge Minium, MD;  Location: [facility];  Service: Endoscopy;  Laterality: N/A;  sleep apnea   GALLBLADDER SURGERY     LYSIS OF ADHESION  05/27/2016   Procedure: LYSIS OF ADHESION;  Surgeon: Ricarda Frame, MD;  Location: [facility];  Service: General;;   PARTIAL MASTECTOMY WITH NEEDLE LOCALIZATION Left 04/19/2018   Procedure: PARTIAL MASTECTOMY WITH NEEDLE LOCALIZATION;  Surgeon: Carolan Shiver, MD;  Location: [facility];  Service: General;  Laterality: Left;   ROBOTIC ASSISTED LAPAROSCOPIC CHOLECYSTECTOMY N/A 05/27/2016   Procedure: ROBOTIC ASSISTED LAPAROSCOPIC CHOLECYSTECTOMY;  Surgeon: Ricarda Frame, MD;  Location: [facility];  Service: General;  Laterality: N/A;   SENTINEL NODE BIOPSY Left 04/19/2018   Procedure: SENTINEL NODE BIOPSY;  Surgeon: Carolan Shiver, MD;  Location: [facility];  Service: General;  Laterality: Left;   SHOULDER ARTHROSCOPY  2000    TONSILLECTOMY Right 09/19/2019   Procedure: PARTIAL VS TOTAL TONSILLECTOMY;  Surgeon: Bud Face, MD;  Location: [facility];  Service: ENT;  Laterality: Right;   TUBAL LIGATION      Prior to Admission medications   Medication Sig Start Date End Date Taking? Authorizing Provider  anastrozole (ARIMIDEX) 1 MG tablet Take 1 tablet (1 mg total) by mouth daily. 03/07/23  Yes Creig Hines, MD  aspirin EC 81 MG tablet Take 81 mg by mouth daily.    Yes [provider]  atorvastatin (LIPITOR) 40 MG tablet Take 1 tablet by mouth once daily 03/18/22  Yes Masoud, Renda Rolls, MD  clonazePAM (KLONOPIN) 0.5 MG tablet Take 0.5 mg by mouth 2 (two) times daily as needed.   Yes [provider]  diltiazem (CARDIZEM CD) 180 MG 24 hr capsule Take 1 capsule by mouth once daily 03/29/22  Yes Masoud, Renda Rolls, MD  fluticasone (FLONASE) 50 MCG/ACT nasal spray Place 1 spray into both nostrils 2 (two) times daily. 08/05/19  Yes [provider]  gabapentin (NEURONTIN) 300 MG capsule Take 300 mg by mouth 3 (three) times daily as needed. 10/05/17  Yes [provider]  loratadine (CLARITIN) 10 MG tablet Take 10 mg by mouth daily as needed for allergies. 12/21/17  Yes [provider]  losartan (COZAAR) 100 MG tablet TAKE 1 TABLET BY MOUTH EVERY 12 HOURS 06/14/22  Yes Masoud, Renda Rolls, MD  methocarbamol (ROBAXIN) 500 MG  tablet Take by mouth as needed. 07/17/20  Yes [provider]  Multiple Vitamins-Minerals (MULTIVITAMIN WITH MINERALS) tablet Take 1 tablet by mouth daily.   Yes [provider]  pantoprazole (PROTONIX) 40 MG tablet Take 1 tablet (40 mg total) by mouth 2 (two) times daily. MUST SCHEDULE OFFICE VISIT 10/20/21  Yes Corky Downs, MD  traZODone (DESYREL) 100 MG tablet Take 100 mg by mouth at bedtime as needed (sleep).  09/15/17  Yes [provider]  venlafaxine XR (EFFEXOR-XR) 75 MG 24 hr capsule Take 1 capsule by mouth daily. 08/27/19  Yes [provider]  VITAMIN D PO Take by mouth daily.   Yes [provider]  ziprasidone (GEODON) 40 MG capsule Take 40 mg by mouth at bedtime as needed. 10/12/17  Yes [provider]    Allergies as of 01/17/2023 - Review Complete 01/17/2023  Allergen Reaction Noted   Lamictal [lamotrigine] Rash and Other (See Comments) 03/20/2014   Depakote [divalproex sodium] Other (See Comments) 03/20/2014   Other Other (See Comments) 12/19/2019   Lithium Other (See Comments) 03/20/2014    Family History  Problem Relation Age of Onset   Thyroid cancer Mother        dx 20s; currently 49   Hypertension Mother    Diabetes Mother    Breast cancer Maternal Aunt 50       deceased 1s   Alzheimer's disease Father        deceased 24   Stroke Father    Drug abuse Sister        deceased 83   Diabetes Brother    Hypertension Brother    Heart attack Maternal Grandfather    Diabetes Brother    Hypertension Brother    Alcohol abuse Brother    Schizophrenia Brother    Stomach cancer Paternal Aunt        age at dx unknown   Stomach cancer Paternal Uncle        age at dx unknown   Cancer Other        daughter of sister who died at 40; breast ca in 38s and ovarian cancer at 36; currently 38s    Social History   Socioeconomic History   Marital status: Divorced    Spouse name: Not on file   Number of children: Not on file   Years of education: Not on file   Highest education level: Not on file  Occupational History   Not on file  Tobacco Use   Smoking status: Every Day    Current packs/day: 0.50    Average packs/day: 0.5 packs/day for 31.8 years (15.9 ttl pk-yrs)    Types: Cigarettes    Start date: 57   Smokeless tobacco: Never   Tobacco comments:    started around 1993  Vaping Use   Vaping status: Former  Substance and Sexual Activity   Alcohol use: No   Drug use: No   Sexual activity: Not Currently  Other Topics Concern   Not on file  Social History Narrative   Not  on file   Social Determinants of Health   Financial Resource Strain: Low Risk  (04/09/2021)   Overall Financial Resource Strain (CARDIA)    Difficulty of Paying Living Expenses: Not hard at all  Food Insecurity: No Food Insecurity (04/09/2021)   Hunger Vital Sign    Worried About Running Out of Food in the Last Year: Never true    Ran Out of Food in the Last  Year: Never true  Transportation Needs: No Transportation Needs (04/09/2021)   PRAPARE - Administrator, Civil Service (Medical): No    Lack of Transportation (Non-Medical): No  Physical Activity: Unknown (04/09/2021)   Exercise Vital Sign    Days of Exercise per Week: Not on file    Minutes of Exercise per Session: 30 min  Stress: Stress Concern Present (04/09/2021)   [employer] of Occupational Health - Occupational Stress Questionnaire    Feeling of Stress : To some extent  Social Connections: Socially Isolated (04/09/2021)   Social Connection and Isolation Panel [NHANES]    Frequency of Communication with Friends and Family: More than three times a week    Frequency of Social Gatherings with Friends and Family: More than three times a week    Attends Religious Services: Never    Database administrator or Organizations: No    Attends Banker Meetings: Never    Marital Status: Divorced  Catering manager Violence: Not At Risk (04/09/2021)   Humiliation, Afraid, Rape, and Kick questionnaire    Fear of Current or Ex-Partner: No    Emotionally Abused: No    Physically Abused: No    Sexually Abused: No    Review of Systems: See HPI, otherwise negative ROS  Physical Exam: BP 120/75   Pulse 80   Temp (!) 97.2 F (36.2 C) (Temporal)   Resp 16   Ht 5\' 5"  (1.651 m)   Wt 76.1 kg   SpO2 100%   BMI 27.92 kg/m  General:   Alert,  pleasant and cooperative in NAD Head:  Normocephalic and atraumatic. Neck:  Supple; no masses or thyromegaly. Lungs:  Clear throughout to auscultation.    Heart:   Regular rate and rhythm. Abdomen:  Soft, nontender and nondistended. Normal bowel sounds, without guarding, and without rebound.   Neurologic:  Alert and  oriented x4;  grossly normal neurologically.  Impression/Plan: Tiffany Mathews is here for an endoscopy to be performed for early satiety  Risks, benefits, limitations, and alternatives regarding  endoscopy have been reviewed with the patient.  Questions have been answered.  All parties agreeable.   Midge Minium, MD  04/01/2023, 10:01 AM

## 2023-04-01 NOTE — Anesthesia Postprocedure Evaluation (Signed)
Anesthesia Post Note  Patient: Tiffany Mathews  Procedure(s) Performed: ESOPHAGOGASTRODUODENOSCOPY (EGD) WITH PROPOFOL (Mouth)  Patient location during evaluation: Endoscopy Anesthesia Type: General Level of consciousness: awake and alert Pain management: pain level controlled Vital Signs Assessment: post-procedure vital signs reviewed and stable Respiratory status: spontaneous breathing, nonlabored ventilation, respiratory function stable and patient connected to nasal cannula oxygen Cardiovascular status: blood pressure returned to baseline and stable Postop Assessment: no apparent nausea or vomiting Anesthetic complications: no   No notable events documented.   Last Vitals:  Vitals:   04/01/23 1025 04/01/23 1028  BP: 116/61   Pulse: 88 83  Resp: 20 14  Temp: (!) 36.2 C   SpO2: 98% 98%    Last Pain:  Vitals:   04/01/23 1025  TempSrc:   PainSc: 0-No pain                 Cleda Mccreedy Donabelle Molden

## 2023-04-01 NOTE — Op Note (Signed)
[facility] Patient Name: Tiffany Mathews Procedure Date: 04/01/2023 10:06 AM MRN: [id number] Account #: [id number] Date of Birth: May 08, 1961 Admit Type: Outpatient Age: 62 Room: [facility] OR ROOM 01 Gender: Female Note Status: Finalized Instrument Name: [id number] Procedure:             Upper GI endoscopy Indications:           Early satiety Providers:             Midge Minium MD, MD Referring MD:          Midge Minium MD, MD (Referring MD), Corky Downs, MD                         (Referring MD) Medicines:             Propofol per Anesthesia Complications:         No immediate complications. Procedure:             Pre-Anesthesia Assessment:                        - Prior to the procedure, a History and Physical was                         performed, and patient medications and allergies were                         reviewed. The patient's tolerance of previous                         anesthesia was also reviewed. The risks and benefits                         of the procedure and the sedation options and risks                         were discussed with the patient. All questions were                         answered, and informed consent was obtained. Prior                         Anticoagulants: The patient has taken no anticoagulant                         or antiplatelet agents. ASA Grade Assessment: II - A                         patient with mild systemic disease. After reviewing                         the risks and benefits, the patient was deemed in                         satisfactory condition to undergo the procedure.                        After obtaining informed consent, the endoscope was  passed under direct vision. Throughout the procedure,                         the patient's blood pressure, pulse, and oxygen                         saturations were monitored continuously. The Endoscope                          was introduced through the mouth, and advanced to the                         second part of duodenum. The upper GI endoscopy was                         accomplished without difficulty. The patient tolerated                         the procedure well. Findings:      A medium-sized hiatal hernia was present.      The entire examined stomach was normal. Biopsies were taken with a cold       forceps for histology. Biopsies were taken with a cold forceps for       histology.      The examined duodenum was normal. Impression:            - Medium-sized hiatal hernia.                        - Normal stomach. Biopsied.                        - Normal examined duodenum. Recommendation:        - Discharge patient to home.                        - Resume previous diet.                        - Continue present medications.                        - Await pathology results. Procedure Code(s):     --- Professional ---                        [phone], Esophagogastroduodenoscopy, flexible,                         transoral; with biopsy, single or multiple Diagnosis Code(s):     --- Professional ---                        R68.81, Early satiety CPT copyright 2022 American Medical Association. All rights reserved. The codes documented in this report are preliminary and upon coder review may  be revised to meet current compliance requirements. Midge Minium MD, MD 04/01/2023 10:15:11 AM This report has been signed electronically. Number of Addenda: 0 Note Initiated On: 04/01/2023 10:06 AM Total Procedure Duration: 0 hours 2 minutes 53 seconds  Estimated Blood Loss:  Estimated blood loss: none.      [facility]

## 2023-04-01 NOTE — Transfer of Care (Signed)
Immediate Anesthesia Transfer of Care Note  Patient: Tiffany Mathews  Procedure(s) Performed: ESOPHAGOGASTRODUODENOSCOPY (EGD) WITH PROPOFOL (Mouth)  Patient Location: PACU  Anesthesia Type: General  Level of Consciousness: awake, alert  and patient cooperative  Airway and Oxygen Therapy: Patient Spontanous Breathing and Patient connected to supplemental oxygen  Post-op Assessment: Post-op Vital signs reviewed, Patient's Cardiovascular Status Stable, Respiratory Function Stable, Patent Airway and No signs of Nausea or vomiting  Post-op Vital Signs: Reviewed and stable  Complications: No notable events documented.

## 2023-04-01 NOTE — Anesthesia Preprocedure Evaluation (Signed)
Anesthesia Evaluation  Patient identified by MRN, date of birth, ID band Patient awake    Reviewed: Allergy & Precautions, NPO status , Patient's Chart, lab work & pertinent test results  History of Anesthesia Complications Negative for: history of anesthetic complications  Airway Mallampati: III  TM Distance: <3 FB Neck ROM: full    Dental  (+) Chipped, Poor Dentition   Pulmonary sleep apnea , neg COPD, Current Smoker and Patient abstained from smoking.   Pulmonary exam normal        Cardiovascular Exercise Tolerance: Good hypertension, (-) angina Normal cardiovascular exam     Neuro/Psych  PSYCHIATRIC DISORDERS       Neuromuscular disease    GI/Hepatic Neg liver ROS, hiatal hernia,GERD  Controlled,,  Endo/Other  negative endocrine ROS    Renal/GU negative Renal ROS  negative genitourinary   Musculoskeletal   Abdominal   Peds  Hematology negative hematology ROS (+)   Anesthesia Other Findings Past Medical History: No date: Anxiety No date: Arthritis No date: Bipolar 1 disorder (HCC) No date: Breast cancer (HCC) No date: Depression No date: Fibromyalgia No date: GERD (gastroesophageal reflux disease) No date: Hidradenitis suppurativa No date: History of hiatal hernia No date: Hydradenitis No date: Hyperlipidemia No date: Hypertension No date: Multiple thyroid nodules     Comment:  benign No date: Sleep apnea     Comment:  No CPAP  Past Surgical History: 04/2011: ABLATION 2009: BREAST BIOPSY; Right     Comment:  INVASIVE MAMMARY CARCINOMA, GRADE 1 04/19/2018: BREAST LUMPECTOMY; Left     Comment:  INVASIVE MAMMARY CARCINOMA, GRADE 1 No date: CESAREAN SECTION     Comment:  x3 No date: CHOLECYSTECTOMY 03/09/2019: COLONOSCOPY WITH PROPOFOL; N/A     Comment:  Procedure: COLONOSCOPY WITH PROPOFOL;  Surgeon: Midge Minium, MD;  Location: [facility];  Service:               Endoscopy;   Laterality: N/A;  sleep apnea No date: GALLBLADDER SURGERY 05/27/2016: LYSIS OF ADHESION     Comment:  Procedure: LYSIS OF ADHESION;  Surgeon: Ricarda Frame,              MD;  Location: [facility] ORS;  Service: General;; 04/19/2018: PARTIAL MASTECTOMY WITH NEEDLE LOCALIZATION; Left     Comment:  Procedure: PARTIAL MASTECTOMY WITH NEEDLE LOCALIZATION;               Surgeon: Carolan Shiver, MD;  Location: [facility] OR[facility];               Service: General;  Laterality: Left; 05/27/2016: ROBOTIC ASSISTED LAPAROSCOPIC CHOLECYSTECTOMY; N/A     Comment:  Procedure: ROBOTIC ASSISTED LAPAROSCOPIC               CHOLECYSTECTOMY;  Surgeon: Ricarda Frame, MD;                Location: [facility] ORS;  Service: General;  Laterality: N/A; 04/19/2018: SENTINEL NODE BIOPSY; Left     Comment:  Procedure: SENTINEL NODE BIOPSY;  Surgeon: Carolan Shiver, MD;  Location: [facility] OR[facility];  Service: General;                Laterality: Left; 2000: SHOULDER ARTHROSCOPY 09/19/2019: TONSILLECTOMY; Right     Comment:  Procedure: PARTIAL VS TOTAL TONSILLECTOMY;  Surgeon:  Bud Face, MD;  Location: [facility];                Service: ENT;  Laterality: Right; No date: TUBAL LIGATION  BMI    Body Mass Index: 27.92 kg/m      Reproductive/Obstetrics negative OB ROS                             Anesthesia Physical Anesthesia Plan  ASA: 2  Anesthesia Plan: General   Post-op Pain Management:    Induction: Intravenous  PONV Risk Score and Plan: Propofol infusion and TIVA  Airway Management Planned: Natural Airway and Nasal Cannula  Additional Equipment:   Intra-op Plan:   Post-operative Plan:   Informed Consent: I have reviewed the patients History and Physical, chart, labs and discussed the procedure including the risks, benefits and alternatives for the proposed anesthesia with the patient or authorized representative who has indicated his/her  understanding and acceptance.     Dental Advisory Given  Plan Discussed with: Anesthesiologist, CRNA and Surgeon  Anesthesia Plan Comments: (Patient consented for risks of anesthesia including but not limited to:  - adverse reactions to medications - risk of airway placement if required - damage to eyes, teeth, lips or other oral mucosa - nerve damage due to positioning  - sore throat or hoarseness - Damage to heart, brain, nerves, lungs, other parts of body or loss of life  Patient voiced understanding and assent.)       Anesthesia Quick Evaluation

## 2023-04-01 NOTE — Plan of Care (Signed)
 [facility] Tonsillectomy/Adenoidectomy, Postoperative PEDS care plan entered in error.

## 2023-04-02 ENCOUNTER — Encounter: Payer: Self-pay | Admitting: Gastroenterology

## 2023-04-04 LAB — SURGICAL PATHOLOGY

## 2023-04-05 ENCOUNTER — Encounter: Payer: Self-pay | Admitting: Gastroenterology

## 2023-04-11 ENCOUNTER — Telehealth: Payer: Self-pay | Admitting: Gastroenterology

## 2023-04-11 NOTE — Telephone Encounter (Signed)
Patient called and left a voicemail wanting to know her result from her EGD I called patient back to let him know we received her message. The patient called back. She stated that she haven't heard anything and she has been waiting since 04/01/23.

## 2023-04-11 NOTE — Telephone Encounter (Signed)
Spoke with pt regarding her EGD results. Advised pt a letter was mailed out to her on 04/05/23. Read letter to her sent by Dr. Servando Snare. Pt had no further questions. Pt will follow up as needed.

## 2023-04-19 ENCOUNTER — Ambulatory Visit
Admission: RE | Admit: 2023-04-19 | Discharge: 2023-04-19 | Disposition: A | Discharge status: Home / Self Care | Payer: Medicare Other | Source: Ambulatory Visit | Attending: General Surgery | Admitting: General Surgery

## 2023-04-19 DIAGNOSIS — Z1231 Encounter for screening mammogram for malignant neoplasm of breast: Secondary | ICD-10-CM | POA: Diagnosis present

## 2023-04-20 ENCOUNTER — Ambulatory Visit: Payer: Medicare Other | Admitting: Radiation Oncology

## 2023-05-11 ENCOUNTER — Ambulatory Visit
Admission: RE | Admit: 2023-05-11 | Discharge: 2023-05-11 | Disposition: A | Discharge status: Home / Self Care | Payer: Medicare Other | Source: Ambulatory Visit | Attending: Radiation Oncology | Admitting: Radiation Oncology

## 2023-05-11 ENCOUNTER — Encounter: Payer: Self-pay | Admitting: Radiation Oncology

## 2023-05-11 VITALS — BP 128/79 | HR 77 | Temp 97.6°F | Resp 16 | Ht 65.0 in | Wt 171.8 lb

## 2023-05-11 DIAGNOSIS — Z79811 Long term (current) use of aromatase inhibitors: Secondary | ICD-10-CM | POA: Insufficient documentation

## 2023-05-11 DIAGNOSIS — Z923 Personal history of irradiation: Secondary | ICD-10-CM | POA: Insufficient documentation

## 2023-05-11 DIAGNOSIS — C50412 Malignant neoplasm of upper-outer quadrant of left female breast: Secondary | ICD-10-CM | POA: Diagnosis present

## 2023-05-11 DIAGNOSIS — Z170 Estrogen receptor positive status [ER+]: Secondary | ICD-10-CM | POA: Insufficient documentation

## 2023-05-11 NOTE — Progress Notes (Signed)
Radiation Oncology Follow up Note  Name: Tiffany Mathews   Date:   05/11/2023 MRN:  [id number] DOB: Jul 25, 1960    This 62 y.o. female presents to the clinic today for 4-year follow-up status post whole breast radiation to her left breast for stage I ER/PR positive invasive mammary carcinoma.  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 She is currently on Arimidex and has had no issues with this medication. She underwent mammograms in November, which were reported as BI-RADS category one negative. She denies any pain or tenderness in the breast. She is also under the care of Dr. Smith Robert and Dr. Harl Bowie..  COMPLICATIONS OF TREATMENT: none  FOLLOW UP COMPLIANCE: keeps appointments   PHYSICAL EXAM:  BP 128/79   Pulse 77   Temp 97.6 F (36.4 C)   Resp 16   Ht 5\' 5"  (1.651 m)   Wt 171 lb 12.8 oz (77.9 kg)   BMI 28.59 kg/m  Lungs are clear to A&P cardiac examination essentially unremarkable with regular rate and rhythm. No dominant mass or nodularity is noted in either breast in 2 positions examined. Incision is well-healed. No axillary or supraclavicular adenopathy is appreciated. Cosmetic result is excellent.  RADIOLOGY RESULTS: Mammograms reviewed compatible with above-stated findings RADIOLOGY Mammogram: BI-RADS category 1 negative (04/2023)  PLAN: Stage 1 ER/PR positive invasive mammary carcinoma Status post whole breast radiation to the left breast. Currently on Arimidex. Recent mammograms in November were BI-RADS category 1 negative. No reported pain or tenderness. -Continue Arimidex as prescribed. -No need for annual follow-up, but available for any arising issues.    Carmina Miller, MD

## 2023-09-05 ENCOUNTER — Inpatient Hospital Stay: Payer: Medicare Other | Attending: Oncology | Admitting: Oncology

## 2023-09-05 ENCOUNTER — Encounter: Payer: Self-pay | Admitting: Oncology

## 2023-11-14 ENCOUNTER — Other Ambulatory Visit: Payer: Self-pay | Admitting: Otolaryngology

## 2023-11-14 DIAGNOSIS — E041 Nontoxic single thyroid nodule: Secondary | ICD-10-CM

## 2023-11-16 ENCOUNTER — Ambulatory Visit
Admission: RE | Admit: 2023-11-16 | Discharge: 2023-11-16 | Disposition: A | Discharge status: Home / Self Care | Source: Ambulatory Visit | Attending: Otolaryngology | Admitting: Otolaryngology

## 2023-11-16 DIAGNOSIS — E041 Nontoxic single thyroid nodule: Secondary | ICD-10-CM

## 2023-11-18 ENCOUNTER — Encounter: Payer: Self-pay | Admitting: General Practice

## 2023-11-18 ENCOUNTER — Ambulatory Visit (INDEPENDENT_AMBULATORY_CARE_PROVIDER_SITE_OTHER): Admitting: General Practice

## 2023-11-18 VITALS — BP 116/80 | HR 82 | Temp 98.2°F | Ht 64.25 in | Wt 169.0 lb

## 2023-11-18 DIAGNOSIS — K219 Gastro-esophageal reflux disease without esophagitis: Secondary | ICD-10-CM | POA: Diagnosis not present

## 2023-11-18 DIAGNOSIS — E041 Nontoxic single thyroid nodule: Secondary | ICD-10-CM

## 2023-11-18 DIAGNOSIS — I10 Essential (primary) hypertension: Secondary | ICD-10-CM | POA: Diagnosis not present

## 2023-11-18 DIAGNOSIS — Z7689 Persons encountering health services in other specified circumstances: Secondary | ICD-10-CM | POA: Insufficient documentation

## 2023-11-18 DIAGNOSIS — F3177 Bipolar disorder, in partial remission, most recent episode mixed: Secondary | ICD-10-CM | POA: Diagnosis not present

## 2023-11-18 MED ORDER — DILTIAZEM HCL ER COATED BEADS 180 MG PO CP24
180.0000 mg | ORAL_CAPSULE | Freq: Every day | ORAL | 1 refills | Status: DC
Start: 2023-11-18 — End: 2024-04-27

## 2023-11-18 MED ORDER — LOSARTAN POTASSIUM 100 MG PO TABS: 100.0000 mg | ORAL_TABLET | Freq: Every day | ORAL | 1 refills | Status: DC

## 2023-11-18 NOTE — Progress Notes (Signed)
 New Patient Office Visit  Subjective    Patient ID: Tiffany Mathews, female    DOB: 07-Jun-1961  Age: 63 y.o. MRN: [id number]  CC:  Chief Complaint  Patient presents with   Establish Care    Ran out of diltiazem  2 days ago.  Saw ENT this week, was told she has a thyroid  nodule and a growth on vocal cord.     HPI Tiffany Mathews is a 63 y.o. female presents to establish care.   Last pcp/physical/labs: Theron Flavin MD  HTN: diagnosed many years ago. Currently managed on Diltiazem  180 mg once daily in the morning and Losartan  100 mg in the evening once daily. The home BP readings have been in the 120's-140's / 80's range. She needs refill of Diltiazem . She denies any blurred vision, headache, chest pain or shortness of breath.   Thyroid  nodule: present for many years ago. Was following with endocrinologist ( Dr. Solum @ [facility]) and has many thyoid ultrasound in the past. She started having trouble swallowing for two weeks along with some pain and pressure in her throat. Monday, she was evaluated by her ENT, Dr. Donnie Galea, who ordered a thyroid  ultrasound, which was done yesterday. She has not gotten the results yet. She was also told that there is growth on the vocal cord. She is waiting to hear back about the results.   Bipolar disorder:  followed by psychiatrist in [place]. Has an appt once a month via telehealth. Currently managed on Geodon  40 mg once daily and Klonopin  0.5 mg as needed. He provides those refills.   Hiatal hernia: followed by Dr. Danise Durie. Had an endoscopy and was confirmed that she has a hiatal hernia. She was prescribed pantoprazole  but she stopped that. Her ENT recommended her to start omeprazole.    Outpatient Encounter Medications as of 11/18/2023  Medication Sig   aspirin EC 81 MG tablet Take 81 mg by mouth daily.    atorvastatin  (LIPITOR) 40 MG tablet Take 1 tablet by mouth once daily   clonazePAM  (KLONOPIN ) 0.5 MG tablet Take 0.5 mg by mouth 2 (two) times daily as  needed.   fluticasone  (FLONASE ) 50 MCG/ACT nasal spray Place 1 spray into both nostrils 2 (two) times daily.   gabapentin  (NEURONTIN ) 300 MG capsule Take 300 mg by mouth 3 (three) times daily as needed.   Multiple Vitamins-Minerals (MULTIVITAMIN WITH MINERALS) tablet Take 1 tablet by mouth daily.   traZODone  (DESYREL ) 100 MG tablet Take 100 mg by mouth at bedtime as needed (sleep).    venlafaxine  XR (EFFEXOR -XR) 75 MG 24 hr capsule Take 1 capsule by mouth daily.   VITAMIN D PO Take by mouth daily.   ziprasidone  (GEODON ) 40 MG capsule Take 40 mg by mouth at bedtime as needed.   [DISCONTINUED] diltiazem  (CARDIZEM  CD) 180 MG 24 hr capsule Take 1 capsule by mouth once daily   [DISCONTINUED] losartan  (COZAAR ) 100 MG tablet TAKE 1 TABLET BY MOUTH EVERY 12 HOURS   diltiazem  (CARDIZEM  CD) 180 MG 24 hr capsule Take 1 capsule (180 mg total) by mouth daily.   losartan  (COZAAR ) 100 MG tablet Take 1 tablet (100 mg total) by mouth daily.   [DISCONTINUED] anastrozole  (ARIMIDEX ) 1 MG tablet Take 1 tablet (1 mg total) by mouth daily.   [DISCONTINUED] loratadine  (CLARITIN ) 10 MG tablet Take 10 mg by mouth daily as needed for allergies.   [DISCONTINUED] methocarbamol (ROBAXIN) 500 MG tablet Take by mouth as needed.   [DISCONTINUED] pantoprazole  (PROTONIX ) 40 MG tablet Take 1  tablet (40 mg total) by mouth 2 (two) times daily. MUST SCHEDULE OFFICE VISIT   No facility-administered encounter medications on file as of 11/18/2023.    Past Medical History:  Diagnosis Date   Anxiety    Arthritis    Bipolar 1 disorder (HCC)    Breast cancer (HCC)    Depression    Fibromyalgia    GERD (gastroesophageal reflux disease)    Hidradenitis suppurativa    History of hiatal hernia    Hydradenitis    Hyperlipidemia    Hypertension    Multiple thyroid  nodules    benign   Sleep apnea    No CPAP    Past Surgical History:  Procedure Laterality Date   ABLATION  04/2011   BREAST BIOPSY Right 2009   INVASIVE MAMMARY  CARCINOMA, GRADE 1   BREAST LUMPECTOMY Left 04/19/2018   INVASIVE MAMMARY CARCINOMA, GRADE 1   CESAREAN SECTION     x3   CHOLECYSTECTOMY     COLONOSCOPY WITH PROPOFOL  N/A 03/09/2019   Procedure: COLONOSCOPY WITH PROPOFOL ;  Surgeon: Marnee Sink, MD;  Location[facility];  Service: Endoscopy;  Laterality: N/A;  sleep apnea   ESOPHAGOGASTRODUODENOSCOPY (EGD) WITH PROPOFOL  N/A 04/01/2023   Procedure: ESOPHAGOGASTRODUODENOSCOPY (EGD) WITH PROPOFOL ;  Surgeon: Marnee Sink, MD;  Location: [facility];  Service: Endoscopy;  Laterality: N/A;   GALLBLADDER SURGERY     LYSIS OF ADHESION  05/27/2016   Procedure: LYSIS OF ADHESION;  Surgeon: Gwyndolyn Lerner, MD;  Location: [facility] OR[facility];  Service: General;;   PARTIAL MASTECTOMY WITH NEEDLE LOCALIZATION Left 04/19/2018   Procedure: PARTIAL MASTECTOMY WITH NEEDLE LOCALIZATION;  Surgeon: Eldred Grego, MD;  Location: [facility] OR[facility];  Service: General;  Laterality: Left;   ROBOTIC ASSISTED LAPAROSCOPIC CHOLECYSTECTOMY N/A 05/27/2016   Procedure: ROBOTIC ASSISTED LAPAROSCOPIC CHOLECYSTECTOMY;  Surgeon: Gwyndolyn Lerner, MD;  Location: [facility] OR[facility];  Service: General;  Laterality: N/A;   SENTINEL NODE BIOPSY Left 04/19/2018   Procedure: SENTINEL NODE BIOPSY;  Surgeon: Eldred Grego, MD;  Location: [facility] OR[facility];  Service: General;  Laterality: Left;   SHOULDER ARTHROSCOPY  2000   TONSILLECTOMY Right 09/19/2019   Procedure: PARTIAL VS TOTAL TONSILLECTOMY;  Surgeon: Rogers Clayman, MD;  Location: [facility];  Service: ENT;  Laterality: Right;   TUBAL LIGATION      Family History  Problem Relation Age of Onset   Thyroid  cancer Mother        dx 34s; currently 25   Hypertension Mother    Diabetes Mother    Alzheimer's disease Father        deceased 52   Stroke Father    Drug abuse Sister        deceased 23   Breast cancer Maternal Aunt 16       deceased 35s   Stomach cancer Paternal Aunt        age at dx unknown   Stomach  cancer Paternal Uncle        age at dx unknown   Heart attack Maternal Grandfather    Diabetes Brother    Hypertension Brother    Diabetes Brother    Hypertension Brother    Alcohol abuse Brother    Schizophrenia Brother    Breast cancer Other    Cancer Other        daughter of sister who died at 33; breast ca in 30s and ovarian cancer at 59; currently 32s    Social History   Socioeconomic History   Marital status: Divorced  Spouse name: Not on file   Number of children: Not on file   Years of education: Not on file   Highest education level: Not on file  Occupational History   Not on file  Tobacco Use   Smoking status: Former    Current packs/day: 0.50    Average packs/day: 0.5 packs/day for 32.4 years (16.2 ttl pk-yrs)    Types: Cigarettes    Start date: 65   Smokeless tobacco: Never   Tobacco comments:    started around 1993  Vaping Use   Vaping status: Former  Substance and Sexual Activity   Alcohol use: No   Drug use: No   Sexual activity: Not Currently  Other Topics Concern   Not on file  Social History Narrative   Not on file   Social Drivers of Health   Financial Resource Strain: High Risk (05/03/2023)   Received from [facility]   Overall Financial Resource Strain (CARDIA)    Difficulty of Paying Living Expenses: Hard  Food Insecurity: Food Insecurity Present (04/26/2023)   Received from [facility]   Hunger Vital Sign    Within the past 12 months, you worried that your food would run out before you got the money to buy more.: Sometimes true    Within the past 12 months, the food you bought just didn't last and you didn't have money to get more.: Sometimes true  Transportation Needs: No Transportation Needs (04/26/2023)   Received from [facility] - Transportation    In the past 12 months, has lack of transportation kept you from medical appointments or from getting medications?: No     Lack of Transportation (Non-Medical): No  Physical Activity: Insufficiently Active (05/03/2023)   Received from [facility]   Exercise Vital Sign    On average, how many days per week do you engage in moderate to strenuous exercise (like a brisk walk)?: 1 day    On average, how many minutes do you engage in exercise at this level?: 10 min  Stress: Stress Concern Present (04/09/2021)   [employer] of Occupational Health - Occupational Stress Questionnaire    Feeling of Stress : To some extent  Social Connections: Socially Isolated (04/09/2021)   Social Connection and Isolation Panel    Frequency of Communication with Friends and Family: More than three times a week    Frequency of Social Gatherings with Friends and Family: More than three times a week    Attends Religious Services: Never    Database administrator or Organizations: No    Attends Banker Meetings: Never    Marital Status: Divorced  Catering manager Violence: Not At Risk (04/09/2021)   Humiliation, Afraid, Rape, and Kick questionnaire    Fear of Current or Ex-Partner: No    Emotionally Abused: No    Physically Abused: No    Sexually Abused: No    Review of Systems  Constitutional:  Negative for chills and fever.  Respiratory:  Negative for shortness of breath.   Cardiovascular:  Negative for chest pain.  Gastrointestinal:  Negative for abdominal pain, constipation, diarrhea, heartburn, nausea and vomiting.  Genitourinary:  Negative for dysuria, frequency and urgency.  Neurological:  Negative for dizziness and headaches.  Endo/Heme/Allergies:  Negative for polydipsia.  Psychiatric/Behavioral:  Negative for depression and suicidal ideas. The patient is not nervous/anxious.         Objective    BP 116/80 (  BP Location: Left Arm, Patient Position: Sitting, Cuff Size: Large)   Pulse 82   Temp 98.2 F (36.8 C) (Oral)   Ht 5' 4.25 (1.632 m)   Wt 169 lb (76.7 kg)   SpO2 97%    BMI 28.78 kg/m   Physical Exam Vitals and nursing note reviewed.  Constitutional:      Appearance: Normal appearance.   Cardiovascular:     Rate and Rhythm: Normal rate and regular rhythm.     Pulses: Normal pulses.     Heart sounds: Normal heart sounds.  Pulmonary:     Effort: Pulmonary effort is normal.     Breath sounds: Normal breath sounds.   Neurological:     Mental Status: She is alert and oriented to person, place, and time.   Psychiatric:        Mood and Affect: Mood normal.        Behavior: Behavior normal.        Thought Content: Thought content normal.        Judgment: Judgment normal.         Assessment & Plan:  Primary hypertension Assessment & Plan: BP at goal today.  Discussed to report any consistent home readings above 140/90.  Continue Diltiazem  180 mg once daily. Rx sent.  Continue Losartan  100 mg once daily. Rx sent.   Reviewed labs from August 2024.  F/u in 6 months.   Orders: -     dilTIAZem  HCl ER Coated Beads; Take 1 capsule (180 mg total) by mouth daily.  Dispense: 90 capsule; Refill: 1 -     Losartan  Potassium; Take 1 tablet (100 mg total) by mouth daily.  Dispense: 90 tablet; Refill: 1  Thyroid  nodule Assessment & Plan: Thyroid  exam is normal.  Following with endocrinology.  Waiting to hear back regarding her ultrasound results.   Await results.  Reviewed TSH from 2024.   Bipolar disorder, in partial remission, most recent episode mixed [facility]) Assessment & Plan: Controlled.   Following with psychiatry.   Continue meds per psychiatry.    Gastroesophageal reflux disease without esophagitis Assessment & Plan: Discussed at length.  Continue omeprazole per ENT recommendation.    Encounter to establish care Assessment & Plan: EMR reviewed briefly.       Return in about 2 months (around 01/18/2024) for HTN; fasting labs. Tiffany Nation, NP

## 2023-11-18 NOTE — Patient Instructions (Signed)
 Refill sent for diltiazem  and losartan .   Keep me posted regarding your thyroid  ultrasound.   It was a pleasure to meet you today! Please don't hesitate to contact me with any questions. Welcome to [facility]!

## 2023-11-18 NOTE — Assessment & Plan Note (Signed)
 EMR reviewed briefly.

## 2023-11-18 NOTE — Assessment & Plan Note (Signed)
 Discussed at length.  Continue omeprazole per ENT recommendation.

## 2023-11-18 NOTE — Assessment & Plan Note (Signed)
 Thyroid  exam is normal.  Following with endocrinology.  Waiting to hear back regarding her ultrasound results.   Await results.  Reviewed TSH from 2024.

## 2023-11-18 NOTE — Assessment & Plan Note (Addendum)
 BP at goal today.  Discussed to report any consistent home readings above 140/90.  Continue Diltiazem  180 mg once daily. Rx sent.  Continue Losartan  100 mg once daily. Rx sent.   Reviewed labs from August 2024.  F/u in 6 months.

## 2023-11-18 NOTE — Assessment & Plan Note (Signed)
 Controlled.   Following with psychiatry.   Continue meds per psychiatry.

## 2023-12-20 ENCOUNTER — Encounter: Payer: Self-pay | Admitting: General Practice

## 2024-01-05 ENCOUNTER — Encounter: Payer: Self-pay | Admitting: General Practice

## 2024-01-05 ENCOUNTER — Telehealth: Payer: Self-pay | Admitting: General Practice

## 2024-01-05 NOTE — Telephone Encounter (Signed)
 Lvmtcb to let pt know the lab visit on 8/13 was sch wrong, needs to be an office with [facility]. Labs will be drawn within visit but pt needs to follow up with pcp. Lab visit has been cancelled. Pt needs to r/s visit with [facility]. No mychart set up to send message. Letter mailed.

## 2024-01-13 ENCOUNTER — Encounter: Payer: Self-pay | Admitting: General Practice

## 2024-01-13 ENCOUNTER — Ambulatory Visit (INDEPENDENT_AMBULATORY_CARE_PROVIDER_SITE_OTHER): Admitting: General Practice

## 2024-01-13 VITALS — BP 130/84 | HR 80 | Temp 97.8°F | Ht 64.25 in | Wt 171.5 lb

## 2024-01-13 DIAGNOSIS — E782 Mixed hyperlipidemia: Secondary | ICD-10-CM

## 2024-01-13 DIAGNOSIS — I10 Essential (primary) hypertension: Secondary | ICD-10-CM

## 2024-01-13 DIAGNOSIS — Z1211 Encounter for screening for malignant neoplasm of colon: Secondary | ICD-10-CM | POA: Insufficient documentation

## 2024-01-13 DIAGNOSIS — E041 Nontoxic single thyroid nodule: Secondary | ICD-10-CM | POA: Diagnosis not present

## 2024-01-13 DIAGNOSIS — K219 Gastro-esophageal reflux disease without esophagitis: Secondary | ICD-10-CM

## 2024-01-13 DIAGNOSIS — Z1231 Encounter for screening mammogram for malignant neoplasm of breast: Secondary | ICD-10-CM

## 2024-01-13 DIAGNOSIS — Z23 Encounter for immunization: Secondary | ICD-10-CM

## 2024-01-13 DIAGNOSIS — R7303 Prediabetes: Secondary | ICD-10-CM | POA: Diagnosis not present

## 2024-01-13 DIAGNOSIS — C50412 Malignant neoplasm of upper-outer quadrant of left female breast: Secondary | ICD-10-CM

## 2024-01-13 DIAGNOSIS — Z170 Estrogen receptor positive status [ER+]: Secondary | ICD-10-CM

## 2024-01-13 LAB — COMPREHENSIVE METABOLIC PANEL WITH GFR
ALT: 11 U/L (ref 0–35)
AST: 12 U/L (ref 0–37)
Albumin: 4.2 g/dL (ref 3.5–5.2)
Alkaline Phosphatase: 40 U/L (ref 39–117)
BUN: 15 mg/dL (ref 6–23)
CO2: 30 meq/L (ref 19–32)
Calcium: 9.1 mg/dL (ref 8.4–10.5)
Chloride: 102 meq/L (ref 96–112)
Creatinine, Ser: 0.76 mg/dL (ref 0.40–1.20)
GFR: 84.22 mL/min (ref >60.00)
Glucose, Bld: 99 mg/dL (ref 70–99)
Potassium: 3.9 meq/L (ref 3.5–5.1)
Sodium: 142 meq/L (ref 135–145)
Total Bilirubin: 0.3 mg/dL (ref 0.2–1.2)
Total Protein: 6.3 g/dL (ref 6.0–8.3)

## 2024-01-13 LAB — LIPID PANEL
Cholesterol: 155 mg/dL (ref 0–200)
HDL: 32.6 mg/dL — ABNORMAL LOW (ref >39.00)
LDL Cholesterol: 81 mg/dL (ref 0–99)
NonHDL: 122.39
Total CHOL/HDL Ratio: 5
Triglycerides: 205 mg/dL — ABNORMAL HIGH (ref 0.0–149.0)
VLDL: 41 mg/dL — ABNORMAL HIGH (ref 0.0–40.0)

## 2024-01-13 LAB — CBC
HCT: 38.4 % (ref 36.0–46.0)
Hemoglobin: 13.2 g/dL (ref 12.0–15.0)
MCHC: 34.4 g/dL (ref 30.0–36.0)
MCV: 93 fl (ref 78.0–100.0)
Platelets: 347 K/uL (ref 150.0–400.0)
RBC: 4.13 Mil/uL (ref 3.87–5.11)
RDW: 13.1 % (ref 11.5–15.5)
WBC: 7.1 K/uL (ref 4.0–10.5)

## 2024-01-13 LAB — TSH: TSH: 1.71 u[IU]/mL (ref 0.35–5.50)

## 2024-01-13 LAB — HEMOGLOBIN A1C: Hgb A1c MFr Bld: 6.3 % (ref 4.6–6.5)

## 2024-01-13 MED ORDER — LOSARTAN POTASSIUM 100 MG PO TABS: 100.0000 mg | ORAL_TABLET | Freq: Every day | ORAL | 1 refills | Status: AC

## 2024-01-13 NOTE — Assessment & Plan Note (Signed)
 Order placed for colonoscopy due in October.

## 2024-01-13 NOTE — Progress Notes (Signed)
 Established Patient Office Visit  Subjective   Patient ID: Tiffany Mathews, female    DOB: May 28, 1961  Age: 63 y.o. MRN: [id number]  Chief Complaint  Patient presents with   Medical Management of Chronic Issues    Here for 2 mo HTN f/u and fasting labs.    HPI  Tiffany Mathews is a 63 year old female with past medical history of HTN, Bergmann's syndrome, macroglossia, GERD, thyroid  nodule, arthritis, bipolar disorder, fibromyalgia presents today for a 2 month follow up.   HTN: she was evaluated on 11/18/23 and was restarted on her Diltiazem  180 mg once daily and Losartan  100 mg once  daily. The home BP readings have been in the 130's / 80's range. She denies any chest pain, shortness of breath, headaches, blurred vision or difficulty breathing.  Prediabetes: last A1c was 5.9 in August, 2024. Has been monitoring diet. She does try to exercise and physically active.  Thyroid  nodule: US  11/16/23. Following with ENT. No concerns at this time.  Patient Active Problem List   Diagnosis Date Noted   Prediabetes 01/13/2024   Mixed hyperlipidemia 01/13/2024   Screening for colon cancer 01/13/2024   Encounter to establish care 11/18/2023   Early satiety 04/01/2023   Fibromyalgia 03/23/2023   Tachycardia 10/20/2021   Hypertension 10/20/2021   Gastroesophageal reflux disease without esophagitis 10/20/2021   Encounter to discuss test results 03/17/2020   Need for influenza vaccination 03/10/2020   Annual physical exam 03/10/2020   Tobacco abuse 03/10/2020   Family history of colonic polyps    Malignant neoplasm of upper-outer quadrant of left breast in female, estrogen receptor positive (HCC) 03/30/2018   Biliary dyskinesia 04/08/2016   Bipolar affective disorder (HCC) 02/23/2016   Clinical depression 02/23/2016   Diverticulitis 02/23/2016   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/2016   Dysthymia 02/23/2016   Arthritis, degenerative 02/23/2016   Adnexal pain 02/23/2016   HPV test positive 02/23/2016    Thyroid  nodule 02/23/2016   Calculus (=stone) 02/23/2016   Macroglossia, congenital 12/25/2015   Myalgia 12/25/2015   Polyarthralgia 12/25/2015   Panic disorder with agoraphobia and moderate panic attacks 12/12/2014   Bipolar I disorder, most recent episode mixed (HCC) 12/12/2014   Past Medical History:  Diagnosis Date   Anxiety    Arthritis    Bipolar 1 disorder (HCC)    Breast cancer (HCC)    Depression    Fibromyalgia    GERD (gastroesophageal reflux disease)    Hidradenitis suppurativa    History of hiatal hernia    Hydradenitis    Hyperlipidemia    Hypertension    Multiple thyroid  nodules    benign   Sleep apnea    No CPAP   Past Surgical History:  Procedure Laterality Date   ABLATION  04/2011   BREAST BIOPSY Right 2009   INVASIVE MAMMARY CARCINOMA, GRADE 1   BREAST LUMPECTOMY Left 04/19/2018   INVASIVE MAMMARY CARCINOMA, GRADE 1   CESAREAN SECTION     x3   CHOLECYSTECTOMY     COLONOSCOPY WITH PROPOFOL  N/A 03/09/2019   Procedure: COLONOSCOPY WITH PROPOFOL ;  Surgeon: Jinny Carmine, MD;  Location: [facility];  Service: Endoscopy;  Laterality: N/A;  sleep apnea   ESOPHAGOGASTRODUODENOSCOPY (EGD) WITH PROPOFOL  N/A 04/01/2023   Procedure: ESOPHAGOGASTRODUODENOSCOPY (EGD) WITH PROPOFOL ;  Surgeon: Jinny Carmine, MD;  Location: [facility];  Service: Endoscopy;  Laterality: N/A;   GALLBLADDER SURGERY     LYSIS OF ADHESION  05/27/2016   Procedure: LYSIS OF ADHESION;  Surgeon: Carlin Pastel,  MD;  Location: [facility];  Service: General;;   PARTIAL MASTECTOMY WITH NEEDLE LOCALIZATION Left 04/19/2018   Procedure: PARTIAL MASTECTOMY WITH NEEDLE LOCALIZATION;  Surgeon: Rodolph Romano, MD;  Location: [facility];  Service: General;  Laterality: Left;   ROBOTIC ASSISTED LAPAROSCOPIC CHOLECYSTECTOMY N/A 05/27/2016   Procedure: ROBOTIC ASSISTED LAPAROSCOPIC CHOLECYSTECTOMY;  Surgeon: Carlin Pastel, MD;  Location: [facility];  Service: General;  Laterality: N/A;    SENTINEL NODE BIOPSY Left 04/19/2018   Procedure: SENTINEL NODE BIOPSY;  Surgeon: Rodolph Romano, MD;  Location: [facility];  Service: General;  Laterality: Left;   SHOULDER ARTHROSCOPY  2000   TONSILLECTOMY Right 09/19/2019   Procedure: PARTIAL VS TOTAL TONSILLECTOMY;  Surgeon: Milissa Hamming, MD;  Location: [facility];  Service: ENT;  Laterality: Right;   TUBAL LIGATION     Allergies  Allergen Reactions   Lamictal [Lamotrigine] Rash and Other (See Comments)   Depakote [Divalproex Sodium] Other (See Comments)    Severe hair loss   Other Other (See Comments)    Flowers and pollen   Lithium Other (See Comments)    Unknown; It just made me feel bad.         01/13/2024    9:15 AM 11/18/2023   10:31 AM 04/09/2021    9:35 AM  Depression screen PHQ 2/9  Decreased Interest 1 1 0  Down, Depressed, Hopeless 0 0 0  PHQ - 2 Score 1 1 0  Altered sleeping 1    Tired, decreased energy 2    Change in appetite 0    Feeling bad or failure about yourself  1    Trouble concentrating 1    Moving slowly or fidgety/restless 0    Suicidal thoughts 0    PHQ-9 Score 6    Difficult doing work/chores Somewhat difficult         01/13/2024    9:16 AM  GAD 7 : Generalized Anxiety Score  Nervous, Anxious, on Edge 3  Control/stop worrying 3  Worry too much - different things 3  Trouble relaxing 2  Restless 0  Easily annoyed or irritable 1  Afraid - awful might happen 3  Total GAD 7 Score 15  Anxiety Difficulty Somewhat difficult      Review of Systems  Constitutional:  Negative for chills and fever.  Respiratory:  Negative for shortness of breath.   Cardiovascular:  Negative for chest pain.  Gastrointestinal:  Negative for abdominal pain, constipation, diarrhea, heartburn, nausea and vomiting.  Genitourinary:  Negative for dysuria, frequency and urgency.  Neurological:  Negative for dizziness and headaches.  Endo/Heme/Allergies:  Negative for polydipsia.   Psychiatric/Behavioral:  Negative for depression and suicidal ideas. The patient is not nervous/anxious.       Objective:     BP 130/84   Pulse 80   Temp 97.8 F (36.6 C) (Oral)   Ht 5' 4.25 (1.632 m)   Wt 171 lb 8 oz (77.8 kg)   SpO2 95%   BMI 29.21 kg/m  BP Readings from Last 3 Encounters:  01/13/24 130/84  11/18/23 116/80  05/11/23 128/79   Wt Readings from Last 3 Encounters:  01/13/24 171 lb 8 oz (77.8 kg)  11/18/23 169 lb (76.7 kg)  05/11/23 171 lb 12.8 oz (77.9 kg)      Physical Exam Vitals and nursing note reviewed.  Constitutional:      Appearance: Normal appearance.  Cardiovascular:     Rate and Rhythm: Normal rate and regular rhythm.     Pulses:  Normal pulses.     Heart sounds: Normal heart sounds.  Pulmonary:     Effort: Pulmonary effort is normal.     Breath sounds: Normal breath sounds.  Neurological:     Mental Status: She is alert and oriented to person, place, and time.  Psychiatric:        Mood and Affect: Mood normal.        Behavior: Behavior normal.        Thought Content: Thought content normal.        Judgment: Judgment normal.      No results found for any visits on 01/13/24.     The 10-year ASCVD risk score (Arnett DK, et al., 2019) is: 5.4%* (Cholesterol units were assumed)    Assessment & Plan:  Primary hypertension Assessment & Plan: BP at goal today.   Discussed the importance of limiting salt intake and exercise.   Continue Diltiazem  180 mg once daily and Losartan  100  mg once daily.  Refill sent for Losartan .   Labs pending. F/u in 6 months.  Orders: -     CBC -     Comprehensive metabolic panel with GFR -     Lipid panel -     Hemoglobin A1c -     Losartan  Potassium; Take 1 tablet (100 mg total) by mouth daily.  Dispense: 90 tablet; Refill: 1  Thyroid  nodule Assessment & Plan: Reviewed thyroid  ultrasound from June, 2025. Benign and no biopsy needed at this time.   TSH pending.  Orders: -      TSH  Encounter for screening mammogram for malignant neoplasm of breast -     3D Screening Mammogram, Left and Right; Future  Screening for colon cancer Assessment & Plan: Order placed for colonoscopy due in October.   Orders: -     Ambulatory referral to Gastroenterology  Prediabetes Assessment & Plan: Hemoglobin A1c pending.  Orders: -     Hemoglobin A1c  Need for vaccination against Streptococcus pneumoniae -     Pneumococcal conjugate vaccine 20-valent  Malignant neoplasm of upper-outer quadrant of left breast in female, estrogen receptor positive (HCC) Assessment & Plan: Following with oncology.  Order placed for mammogram.   Gastroesophageal reflux disease without esophagitis Assessment & Plan: Controlled.  Following with ENT.  Continue Omeprazole.    Mixed hyperlipidemia Assessment & Plan: Lipid panel pending.  Continue Atorvastatin .     Return in about 6 months (around 07/15/2024) for chronic care management.SABRA Carrol Aurora, NP

## 2024-01-13 NOTE — Assessment & Plan Note (Signed)
 BP at goal today.   Discussed the importance of limiting salt intake and exercise.   Continue Diltiazem  180 mg once daily and Losartan  100  mg once daily.  Refill sent for Losartan .   Labs pending. F/u in 6 months.

## 2024-01-13 NOTE — Assessment & Plan Note (Signed)
 Reviewed thyroid  ultrasound from June, 2025. Benign and no biopsy needed at this time.   TSH pending.

## 2024-01-13 NOTE — Assessment & Plan Note (Signed)
 Hemoglobin A1c pending

## 2024-01-13 NOTE — Assessment & Plan Note (Signed)
 Following with oncology.  Order placed for mammogram.

## 2024-01-13 NOTE — Assessment & Plan Note (Signed)
 Lipid panel pending.  Continue Atorvastatin .

## 2024-01-13 NOTE — Assessment & Plan Note (Signed)
 Controlled.  Following with ENT.  Continue Omeprazole.

## 2024-01-13 NOTE — Patient Instructions (Addendum)
 Stop by the lab prior to leaving today. I will notify you of your results once received.   Schedule shingles vaccine at the pharmacy in one week.   Schedule your medicare wellness visit with nurse. Over the phone.   Follow up in 6 months.   It was a pleasure to see you today!

## 2024-01-16 ENCOUNTER — Ambulatory Visit: Payer: Self-pay | Admitting: General Practice

## 2024-01-18 ENCOUNTER — Other Ambulatory Visit

## 2024-01-24 ENCOUNTER — Encounter: Payer: Self-pay | Admitting: *Deleted

## 2024-01-25 ENCOUNTER — Encounter: Payer: Self-pay | Admitting: Licensed Clinical Social Worker

## 2024-01-25 ENCOUNTER — Inpatient Hospital Stay

## 2024-01-25 ENCOUNTER — Inpatient Hospital Stay: Attending: Oncology | Admitting: Licensed Clinical Social Worker

## 2024-01-25 DIAGNOSIS — Z8041 Family history of malignant neoplasm of ovary: Secondary | ICD-10-CM

## 2024-01-25 DIAGNOSIS — Z1379 Encounter for other screening for genetic and chromosomal anomalies: Secondary | ICD-10-CM | POA: Insufficient documentation

## 2024-01-25 DIAGNOSIS — Z853 Personal history of malignant neoplasm of breast: Secondary | ICD-10-CM

## 2024-01-25 DIAGNOSIS — Z800 Family history of malignant neoplasm of digestive organs: Secondary | ICD-10-CM

## 2024-01-25 DIAGNOSIS — Z803 Family history of malignant neoplasm of breast: Secondary | ICD-10-CM

## 2024-01-25 NOTE — Telephone Encounter (Signed)
 Copied from CRM #[id number]. Topic: Clinical - Lab/Test Results >> Jan 25, 2024 11:54 AM Thersia BROCKS wrote: Reason for CRM: Patient called in lab results, relay message and informed her that results has been mailed. Patient stated she had no further questions and understood

## 2024-01-25 NOTE — Progress Notes (Signed)
 REFERRING PROVIDER: Schermerhorn, Debby PARAS, MD [address] [facility] [place],  [place] [address]  PRIMARY PROVIDER:  Vincente Shivers, NP  PRIMARY REASON FOR VISIT:  1. Genetic testing   2. History of breast cancer   3. Family history of breast cancer   4. Family history of ovarian cancer   5. Family history of stomach cancer      HISTORY OF PRESENT ILLNESS:   Ms. Pietila, a 63 y.o. female, was seen for a [facility] cancer genetics consultation at the request of Dr. Lovetta due to a personal and family history of cancer.  Ms. Pfeifle presents to clinic today to discuss the possibility of a hereditary predisposition to cancer, genetic testing, and to further clarify her future cancer risks, as well as potential cancer risks for family members.   CANCER HISTORY:  In 2019, at the age of 73, Ms. Vallejo was diagnosed with left breast cancer.  She underwent lumpectomy in 04/2018, adjuvant radiation and Arimidex . She underwent genetic testing in 04/2018 as well through [facility] and this was negative/normal. Details below.  Oncology History  Malignant neoplasm of upper-outer quadrant of left breast in female, estrogen receptor positive (HCC)  04/04/2018 Cancer Staging   Staging form: Breast, AJCC 8th Edition - Clinical stage from 04/04/2018: Stage IA (cT1b, cN0, cM0, G1, ER+, PR+, HER2-) - Signed by Melanee Annah BROCKS, MD on 04/07/2018   04/07/2018 Initial Diagnosis   Malignant neoplasm of left breast in female, estrogen receptor positive (HCC)   04/25/2018 Cancer Staging   Staging form: Breast, AJCC 8th Edition - Pathologic stage from 04/25/2018: Stage IA (pT1b, pN0, cM0, G1, ER+, PR+, HER2-) - Signed by Melanee Annah BROCKS, MD on 04/25/2018    Past Medical History:  Diagnosis Date   Anxiety    Arthritis    Bipolar 1 disorder (HCC)    Breast cancer (HCC)    Depression    Fibromyalgia    GERD (gastroesophageal reflux disease)    Hidradenitis suppurativa     History of hiatal hernia    Hydradenitis    Hyperlipidemia    Hypertension    Multiple thyroid  nodules    benign   Sleep apnea    No CPAP    Past Surgical History:  Procedure Laterality Date   ABLATION  04/2011   BREAST BIOPSY Right 2009   INVASIVE MAMMARY CARCINOMA, GRADE 1   BREAST LUMPECTOMY Left 04/19/2018   INVASIVE MAMMARY CARCINOMA, GRADE 1   CESAREAN SECTION     x3   CHOLECYSTECTOMY     COLONOSCOPY WITH PROPOFOL  N/A 03/09/2019   Procedure: COLONOSCOPY WITH PROPOFOL ;  Surgeon: Jinny Carmine, MD;  Location: [facility];  Service: Endoscopy;  Laterality: N/A;  sleep apnea   ESOPHAGOGASTRODUODENOSCOPY (EGD) WITH PROPOFOL  N/A 04/01/2023   Procedure: ESOPHAGOGASTRODUODENOSCOPY (EGD) WITH PROPOFOL ;  Surgeon: Jinny Carmine, MD;  Location: [facility];  Service: Endoscopy;  Laterality: N/A;   GALLBLADDER SURGERY     LYSIS OF ADHESION  05/27/2016   Procedure: LYSIS OF ADHESION;  Surgeon: Carlin Pastel, MD;  Location: [facility];  Service: General;;   PARTIAL MASTECTOMY WITH NEEDLE LOCALIZATION Left 04/19/2018   Procedure: PARTIAL MASTECTOMY WITH NEEDLE LOCALIZATION;  Surgeon: Rodolph Romano, MD;  Location: [facility];  Service: General;  Laterality: Left;   ROBOTIC ASSISTED LAPAROSCOPIC CHOLECYSTECTOMY N/A 05/27/2016   Procedure: ROBOTIC ASSISTED LAPAROSCOPIC CHOLECYSTECTOMY;  Surgeon: Carlin Pastel, MD;  Location: [facility];  Service: General;  Laterality: N/A;   SENTINEL NODE BIOPSY Left 04/19/2018  Procedure: SENTINEL NODE BIOPSY;  Surgeon: Rodolph Romano, MD;  Location: [facility];  Service: General;  Laterality: Left;   SHOULDER ARTHROSCOPY  2000   TONSILLECTOMY Right 09/19/2019   Procedure: PARTIAL VS TOTAL TONSILLECTOMY;  Surgeon: Milissa Hamming, MD;  Location: [facility];  Service: ENT;  Laterality: Right;   TUBAL LIGATION      FAMILY HISTORY:  We obtained a detailed, 4-generation family history.  Significant diagnoses are listed  below: Family History  Problem Relation Age of Onset   Thyroid  cancer Mother        dx 29s; currently 55   Hypertension Mother    Diabetes Mother    Alzheimer's disease Father        deceased 49   Stroke Father    Drug abuse Sister        deceased 74   Diabetes Brother    Hypertension Brother    Diabetes Brother    Hypertension Brother    Alcohol abuse Brother    Schizophrenia Brother    Heart attack Maternal Grandfather    Stomach cancer Paternal Grandfather    Breast cancer Maternal Aunt 46       deceased 72s   Uterine cancer Maternal Aunt    Ovarian cancer Maternal Aunt    Stomach cancer Paternal Aunt        age at dx unknown   Stomach cancer Paternal Uncle        age at dx unknown   Breast cancer Other        dx 30s   Ovarian cancer Other        dx 53s   Ms. Krzyzanowski has 1 son and 2 daughters. She has 1 niece who had breast cancer in her 30s, ovarian in her 28s and has not had genetic testing.   Ms. Wold's mother has had thyroid  cancer in her 69s. Three maternal aunts have had cancer - ovarian, uterine and breast.   Ms. Toren's father passed of Alzheimer's at 45. A paternal aunt, uncle and grandfather all had stomach cancer.  Ms. Viscuso is unaware of previous family history of genetic testing for hereditary cancer risks. There is no reported Ashkenazi Jewish ancestry. There is no known consanguinity.    GENETIC COUNSELING ASSESSMENT: Ms. Hightower is a 63 y.o. female with a personal and family history of cancer and negative previous genetic testing. We, therefore, discussed and recommended the following at today's visit.   DISCUSSION:We discussed that approximately 10% of breast cancer is hereditary. Most cases of hereditary breast cancer are associated with BRCA1/BRCA2 genes, although there are other genes associated with hereditary cancer as well. Cancers and risks are gene specific. We discussed that testing is beneficial for several reasons including knowing  about cancer risks, identifying potential screening and risk-reduction options that may be appropriate, and to understand if other family members could be at risk for cancer and allow them to undergo genetic testing. She underwent genetic testing in 2019. While there is updated genetic testing we can offer Ms. Haberl, the testing she had in 2019 was comprehensive. The current updated testing would likely not be of benefit to her but this could change in the future. We reviewed her testing from 2019.   GENETIC TEST RESULTS:  The [facility] Multi-Cancer Panel found no pathogenic mutations.   The test report has been scanned into EPIC and is located under the Molecular Pathology section of the Results Review tab.  A portion of the result report is  included below for reference. Genetic testing reported out on 04/28/2018.      Even though a pathogenic variant was not identified, possible explanations for the cancer in the family may include: There may be no hereditary risk for cancer in the family. The cancers in Ms. Mian and/or her family may be sporadic/familial or due to other genetic and environmental factors. There may be a gene mutation in one of these genes that current testing methods cannot detect but that chance is small. There could be another gene that has not yet been discovered, or that we have not yet tested, that is responsible for the cancer diagnoses in the family.  It is also possible there is a hereditary cause for the cancer in the family that Ms. Barlowe did not inherit.  Therefore, it is important to remain in touch with cancer genetics in the future so that we can continue to offer Ms. Kanno the most up to date genetic testing.   ADDITIONAL GENETIC TESTING:  We discussed with Ms. Berling that her genetic testing was fairly extensive.  If there are additional relevant genes identified to increase cancer risk that can be analyzed in the future, we would be happy to discuss and  coordinate this testing at that time.    CANCER SCREENING RECOMMENDATIONS:  Ms. Weimann's test result is considered negative (normal).  This means that we have not identified a hereditary cause for her personal and family history of cancer at this time.   An individual's cancer risk and medical management are not determined by genetic test results alone. Overall cancer risk assessment incorporates additional factors, including personal medical history, family history, and any available genetic information that may result in a personalized plan for cancer prevention and surveillance. Therefore, it is recommended she continue to follow the cancer management and screening guidelines provided by her primary healthcare provider.  RECOMMENDATIONS FOR FAMILY MEMBERS:   Since she did not inherit a identifiable mutation in a cancer predisposition gene included on this panel, her children could not have inherited a known mutation from her in one of these genes. Individuals in this family might be at some increased risk of developing cancer, over the general population risk, due to the family history of cancer.  Individuals in the family should notify their providers of the family history of cancer. We recommend women in this family have a yearly mammogram beginning at age 33, or 103 years younger than the earliest onset of cancer, an annual clinical breast exam, and perform monthly breast self-exams.  Family members should have colonoscopies by at age 42, or earlier, as recommended by their providers. Other members of the family may still carry a pathogenic variant in one of these genes that Ms. Slagel did not inherit. Based on the family history, we recommend her niece who had breast and ovarian cancer and any other maternal/paternal relatives with cancer have genetic counseling and testing. Ms. Pescador will let us  know if we can be of any assistance in coordinating genetic counseling and/or testing for this family  member.    FOLLOW-UP:  Lastly, we discussed with Ms. Gueye that cancer genetics is a rapidly advancing field and it is possible that new genetic tests will be appropriate for her and/or her family members in the future. We encouraged her to remain in contact with cancer genetics on an annual basis so we can update her personal and family histories and let her know of advances in cancer genetics that may benefit this  family.   Our contact number was provided. Ms. Plucinski's questions were answered to her satisfaction, and she knows she is welcome to call us  at anytime with additional questions or concerns.   Dena Cary, MS, [facility] Genetic Counselor [email or web address] Phone: [phone]  20 minutes were spent on the date of the encounter in service to the patient including preparation, face-to-face consultation, documentation and care coordination. Dr. Delinda was available for discussion regarding this case.   _______________________________________________________________________ For Office Staff:  Number of people involved in session: 1 Was an Intern/ student involved with case: no

## 2024-02-13 ENCOUNTER — Other Ambulatory Visit: Payer: Self-pay | Admitting: Otolaryngology

## 2024-02-20 ENCOUNTER — Other Ambulatory Visit: Payer: Self-pay | Admitting: Otolaryngology

## 2024-02-21 ENCOUNTER — Encounter
Admission: RE | Admit: 2024-02-21 | Discharge: 2024-02-21 | Disposition: A | Discharge status: Home / Self Care | Source: Ambulatory Visit | Attending: Otolaryngology | Admitting: Otolaryngology

## 2024-02-21 ENCOUNTER — Other Ambulatory Visit: Payer: Self-pay

## 2024-02-21 VITALS — Ht 65.0 in | Wt 173.0 lb

## 2024-02-21 DIAGNOSIS — I10 Essential (primary) hypertension: Secondary | ICD-10-CM

## 2024-02-21 DIAGNOSIS — Z01810 Encounter for preprocedural cardiovascular examination: Secondary | ICD-10-CM

## 2024-02-21 HISTORY — DX: Prediabetes: R73.03

## 2024-02-21 NOTE — Patient Instructions (Addendum)
 Your procedure is scheduled on:  TUESDAY SEPTEMBER 23  Report to the Registration Desk on the 1st floor of the [facility] To find out your arrival time, please call [phone] between 1PM - 3PM on:  MONDAY SEPTEMBER 22 If your arrival time is 6:00 am, do not arrive before that time as the [facility] entrance doors do not open until 6:00 am.  REMEMBER: Instructions that are not followed completely may result in serious medical risk, up to and including death; or upon the discretion of your surgeon and anesthesiologist your surgery may need to be rescheduled.  Do not eat food after midnight the night before surgery.  No gum chewing or hard candies.  One week prior to surgery: Stop Anti-inflammatories (NSAIDS) such as Advil, Aleve, Ibuprofen, Motrin, Naproxen, Naprosyn and Aspirin based products such as Excedrin, Goody's Powder, BC Powder. Stop ANY OVER THE COUNTER supplements until after surgery. fluticasone  (FLONASE )  Multiple Vitamins-Minerals (MULTIVITAMIN WITH MINERALS )  VITAMIN   You may however, continue to take Tylenol  if needed for pain up until the day of surgery.   Continue taking all of your other prescription medications up until the day of surgery.  ON THE DAY OF SURGERY ONLY TAKE THESE MEDICATIONS WITH SIPS OF WATER :  diltiazem  (CARDIZEM  CD)   Do not use any recreational drugs for at least a week (preferably 2 weeks) before your surgery.  Please be advised that the combination of cocaine and anesthesia may have negative outcomes, up to and including death. If you test positive for cocaine, your surgery will be cancelled.  On the morning of surgery brush your teeth with toothpaste and water , you may rinse your mouth with mouthwash if you wish. Do not swallow any toothpaste or mouthwash.  Do not wear jewelry, make-up, hairpins, clips or nail polish.  For welded (permanent) jewelry: bracelets, anklets, waist bands, etc.  Please have this removed prior to  surgery.  If it is not removed, there is a chance that hospital personnel will need to cut it off on the day of surgery.  Do not wear lotions, powders, or perfumes.   Contact lenses, hearing aids and dentures may not be worn into surgery.  Do not bring valuables to the hospital. [facility] is not responsible for any missing/lost belongings or valuables.   Notify your doctor if there is any change in your medical condition (cold, fever, infection).  Wear comfortable clothing (specific to your surgery type) to the hospital.  After surgery, you can help prevent lung complications by doing breathing exercises.  Take deep breaths and cough every 1-2 hours.  If you are being discharged the day of surgery, you will not be allowed to drive home. You will need a responsible individual to drive you home and stay with you for 24 hours after surgery.   If you are taking public transportation, you will need to have a responsible individual with you.  Please call the Pre-admissions Testing Dept. at [phone] if you have any questions about these instructions.  Surgery Visitation Policy:  Patients having surgery or a procedure may have two visitors.  Children under the age of 41 must have an adult with them who is not the patient.   Merchandiser, retail to address health-related social needs:  [email or web address]

## 2024-02-22 ENCOUNTER — Encounter
Admission: RE | Admit: 2024-02-22 | Discharge: 2024-02-22 | Disposition: A | Discharge status: Home / Self Care | Source: Ambulatory Visit | Attending: Otolaryngology | Admitting: Otolaryngology

## 2024-02-22 ENCOUNTER — Other Ambulatory Visit

## 2024-02-22 DIAGNOSIS — Z01810 Encounter for preprocedural cardiovascular examination: Secondary | ICD-10-CM | POA: Diagnosis present

## 2024-02-22 DIAGNOSIS — I10 Essential (primary) hypertension: Secondary | ICD-10-CM | POA: Diagnosis not present

## 2024-02-28 ENCOUNTER — Ambulatory Visit: Admitting: Certified Registered"

## 2024-02-28 ENCOUNTER — Encounter: Payer: Self-pay | Admitting: Otolaryngology

## 2024-02-28 ENCOUNTER — Encounter
Admission: RE | Disposition: A | Discharge status: Home / Self Care | Payer: Self-pay | Source: Home / Self Care | Attending: Otolaryngology

## 2024-02-28 ENCOUNTER — Other Ambulatory Visit: Payer: Self-pay

## 2024-02-28 ENCOUNTER — Ambulatory Visit
Admission: RE | Admit: 2024-02-28 | Discharge: 2024-02-28 | Disposition: A | Discharge status: Home / Self Care | Attending: Otolaryngology | Admitting: Otolaryngology

## 2024-02-28 DIAGNOSIS — J387 Other diseases of larynx: Secondary | ICD-10-CM | POA: Diagnosis not present

## 2024-02-28 DIAGNOSIS — J358 Other chronic diseases of tonsils and adenoids: Secondary | ICD-10-CM | POA: Insufficient documentation

## 2024-02-28 DIAGNOSIS — D380 Neoplasm of uncertain behavior of larynx: Secondary | ICD-10-CM | POA: Diagnosis present

## 2024-02-28 DIAGNOSIS — Z79899 Other long term (current) drug therapy: Secondary | ICD-10-CM | POA: Diagnosis not present

## 2024-02-28 DIAGNOSIS — R490 Dysphonia: Secondary | ICD-10-CM | POA: Insufficient documentation

## 2024-02-28 HISTORY — PX: MICROLARYNGOSCOPY WITH LASER: SHX5972

## 2024-02-28 SURGERY — MICROLARYNGOSCOPY, WITH PROCEDURE USING LASER
Anesthesia: General | Site: Throat | Laterality: Left

## 2024-02-28 MED ORDER — ONDANSETRON HCL 4 MG/2ML IJ SOLN
INTRAMUSCULAR | Status: DC | PRN
  Administered 2024-02-28: 4 mg via INTRAVENOUS

## 2024-02-28 MED ORDER — CHLORHEXIDINE GLUCONATE 0.12 % MT SOLN
15.0000 mL | Freq: Once | OROMUCOSAL | Status: AC
  Administered 2024-02-28: 15 mL via OROMUCOSAL

## 2024-02-28 MED ORDER — DEXMEDETOMIDINE HCL IN NACL 80 MCG/20ML IV SOLN
INTRAVENOUS | Status: DC | PRN
  Administered 2024-02-28: 8 ug via INTRAVENOUS
  Administered 2024-02-28: 12 ug via INTRAVENOUS

## 2024-02-28 MED ORDER — OXYMETAZOLINE HCL 0.05 % NA SOLN
NASAL | Status: AC
  Filled 2024-02-28: qty 30

## 2024-02-28 MED ORDER — FENTANYL CITRATE (PF) 100 MCG/2ML IJ SOLN
INTRAMUSCULAR | Status: AC
  Filled 2024-02-28: qty 2

## 2024-02-28 MED ORDER — EPINEPHRINE PF 1 MG/ML IJ SOLN
INTRAMUSCULAR | Status: AC
  Filled 2024-02-28: qty 1

## 2024-02-28 MED ORDER — MIDAZOLAM HCL 2 MG/2ML IJ SOLN
INTRAMUSCULAR | Status: DC | PRN
  Administered 2024-02-28: 2 mg via INTRAVENOUS

## 2024-02-28 MED ORDER — MIDAZOLAM HCL 2 MG/2ML IJ SOLN
INTRAMUSCULAR | Status: AC
  Filled 2024-02-28: qty 2

## 2024-02-28 MED ORDER — FENTANYL CITRATE (PF) 100 MCG/2ML IJ SOLN: 25.0000 ug | INTRAMUSCULAR | Status: DC | PRN

## 2024-02-28 MED ORDER — LACTATED RINGERS IV SOLN: INTRAVENOUS | Status: DC

## 2024-02-28 MED ORDER — SUGAMMADEX SODIUM 200 MG/2ML IV SOLN
INTRAVENOUS | Status: DC | PRN
  Administered 2024-02-28: 200 mg via INTRAVENOUS

## 2024-02-28 MED ORDER — GLYCOPYRROLATE 0.2 MG/ML IJ SOLN
INTRAMUSCULAR | Status: DC | PRN
  Administered 2024-02-28: .2 mg via INTRAVENOUS

## 2024-02-28 MED ORDER — ORAL CARE MOUTH RINSE: 15.0000 mL | Freq: Once | OROMUCOSAL | Status: AC

## 2024-02-28 MED ORDER — CHLORHEXIDINE GLUCONATE 0.12 % MT SOLN
OROMUCOSAL | Status: AC
Start: 2024-02-28 — End: 2024-02-28
  Filled 2024-02-28: qty 15

## 2024-02-28 MED ORDER — PROPOFOL 10 MG/ML IV BOLUS
INTRAVENOUS | Status: DC | PRN
  Administered 2024-02-28: 100 mg via INTRAVENOUS

## 2024-02-28 MED ORDER — ROCURONIUM BROMIDE 100 MG/10ML IV SOLN
INTRAVENOUS | Status: DC | PRN
  Administered 2024-02-28: 50 mg via INTRAVENOUS

## 2024-02-28 MED ORDER — FENTANYL CITRATE (PF) 100 MCG/2ML IJ SOLN
INTRAMUSCULAR | Status: DC | PRN
  Administered 2024-02-28: 100 ug via INTRAVENOUS

## 2024-02-28 MED ORDER — DEXAMETHASONE SODIUM PHOSPHATE 10 MG/ML IJ SOLN
INTRAMUSCULAR | Status: DC | PRN
  Administered 2024-02-28: 10 mg via INTRAVENOUS

## 2024-02-28 MED ORDER — LIDOCAINE HCL (CARDIAC) PF 100 MG/5ML IV SOSY
PREFILLED_SYRINGE | INTRAVENOUS | Status: DC | PRN
  Administered 2024-02-28: 100 mg via INTRAVENOUS

## 2024-02-28 MED ORDER — EPINEPHRINE (ANAPHYLAXIS) 30 MG/30ML IJ SOLN
INTRAMUSCULAR | Status: DC | PRN
  Administered 2024-02-28: 1 mg via ENDOTRACHEOPULMONARY

## 2024-02-28 MED ORDER — ONDANSETRON HCL 4 MG PO TABS: 4.0000 mg | ORAL_TABLET | Freq: Three times a day (TID) | ORAL | 0 refills | Status: AC | PRN

## 2024-02-28 MED ORDER — DROPERIDOL 2.5 MG/ML IJ SOLN: 0.6250 mg | Freq: Once | INTRAMUSCULAR | Status: DC | PRN

## 2024-02-28 MED ORDER — LIDOCAINE HCL (PF) 4 % IJ SOLN
INTRAMUSCULAR | Status: DC | PRN
  Administered 2024-02-28: 4 mL

## 2024-02-28 SURGICAL SUPPLY — 16 items
BASIN GRAD PLASTIC 32OZ STRL (MISCELLANEOUS) ×1 IMPLANT
COVER MAYO STAND STRL (DRAPES) ×1 IMPLANT
CUP MEDICINE 2OZ PLAST GRAD ST (MISCELLANEOUS) ×1 IMPLANT
DRAPE TABLE BACK 80X90 (DRAPES) ×1 IMPLANT
DRSG TELFA 3X4 N-ADH STERILE (GAUZE/BANDAGES/DRESSINGS) ×1 IMPLANT
GAUZE 4X4 16PLY ~~LOC~~+RFID DBL (SPONGE) ×1 IMPLANT
GLOVE BIO SURGEON STRL SZ7.5 (GLOVE) ×1 IMPLANT
GOWN STRL REUS W/ TWL LRG LVL3 (GOWN DISPOSABLE) ×2 IMPLANT
MANIFOLD NEPTUNE II (INSTRUMENTS) ×1 IMPLANT
NDL SAFETY ECLIPSE 18X1.5 (NEEDLE) ×1 IMPLANT
PATTIES SURGICAL .5 X.5 (GAUZE/BANDAGES/DRESSINGS) ×1 IMPLANT
SOLUTION ANTFG W/FOAM PAD STRL (MISCELLANEOUS) ×1 IMPLANT
TOWEL OR 17X26 4PK STRL BLUE (TOWEL DISPOSABLE) ×1 IMPLANT
TRAP FLUID SMOKE EVACUATOR (MISCELLANEOUS) ×1 IMPLANT
TUBING CONNECTING 10 (TUBING) ×1 IMPLANT
WATER STERILE IRR 500ML POUR (IV SOLUTION) ×1 IMPLANT

## 2024-02-28 NOTE — Anesthesia Procedure Notes (Signed)
 Procedure Name: Intubation Date/Time: 02/28/2024 8:39 AM  Performed by: Norleen Alberta HERO., CRNAPre-anesthesia Checklist: Patient identified, Patient being monitored, Timeout performed, Emergency Drugs available and Suction available Patient Re-evaluated:Patient Re-evaluated prior to induction Oxygen Delivery Method: Circle system utilized Preoxygenation: Pre-oxygenation with 100% oxygen Induction Type: IV induction Ventilation: Mask ventilation without difficulty Laryngoscope Size: 3 and McGrath Grade View: Grade I Tube type: Oral Tube size: 6.0 (MLT) mm Number of attempts: 1 Airway Equipment and Method: Stylet Placement Confirmation: ETT inserted through vocal cords under direct vision, positive ETCO2 and breath sounds checked- equal and bilateral Secured at: 21 cm Tube secured with: Tape Dental Injury: Teeth and Oropharynx as per pre-operative assessment

## 2024-02-28 NOTE — H&P (Signed)
..  History and Physical reviewed and patient seen and examined.

## 2024-02-28 NOTE — Transfer of Care (Signed)
 Immediate Anesthesia Transfer of Care Note  Patient: Tiffany Mathews  Procedure(s) Performed: MICROLARYNGOSCOPY, WITH PROCEDURE USING LASER (Left: Throat)  Patient Location: PACU  Anesthesia Type:General  Level of Consciousness: awake, alert , oriented, and patient cooperative  Airway & Oxygen Therapy: Patient Spontanous Breathing and Patient connected to face mask oxygen  Post-op Assessment: Report given to RN and Post -op Vital signs reviewed and stable  Post vital signs: stable  Last Vitals:  Vitals Value Taken Time  BP 116/65 02/28/24 09:19  Temp    Pulse 82 02/28/24 09:21  Resp 18 02/28/24 09:21  SpO2 100 % 02/28/24 09:21  Vitals shown include unfiled device data.  Last Pain:  Vitals:   02/28/24 0751  TempSrc: Temporal  PainSc: 0-No pain         Complications: No notable events documented.

## 2024-02-28 NOTE — Op Note (Signed)
....  02/28/2024  9:16 AM    Rubin Notice  [id number]   Pre-Op Dx:  Left vocal fold lesion  Post-op Dx: same  Proc: Suspension Microlaryngoscopy with microflap excision of left vocal fold lesion  Surg:  Carolee Mellie Buccellato  Anes:  GOT  EBL:  <31ml  Comp:  none  Findings:  small cystic structure of dorsal aspect of left anterior vocal fold.  This was ruptured and lining removed  Procedure: After the patient was identified in holding and the history and physical and consent was reviewed, the patient was taken to the operating room and placed in a supine position. General endotracheal anesthesia was induced in the normal fashion with MLT.  At this time, the patient was rotated 90 degrees and a shoulder roll was placed as well as well as a mouth guard.   At this time,Dedo laryngoscope was inserted into the patient's oral cavity. Visualization of the oral cavity, oropharynx, pharynx and larynx was made.  This demonstrated a subtle but persistent left dorsal cystic mass. A magnified zero degree Hopkin's rod was used to evaluate the mass. Topical epinephrine  on pledgets were placed against the lesion for approximately one minute. At this time, the Dedo laryngoscope was suspended from the [facility] stand and the operating microscope was brought onto the field.   Under high power magnification, a micro cup forceps was used to gently grasp the mass and retract it medially. A sickle knife was used to gently incise the mucosa of the dorsal aspect of the patient's left vocal fold lateral to the mass.. Using a sickle knife under high power magnification the cystic structure was gently circumferentially dissected.  This ended up rupturing the small cyst and mucoid fluid emanated from it.   A small rent was made in the mass and turbid fluid was suctioned out. Cup forceps were used to remove the minute mucosa of the cyst lining.  Hemostasis was continued.   The patient's entire airway was next evaluated  for hemostasis and meticulous suctioning was performed. 0.25ccs of 4% lidocaine  was sprayed onto the epiglottis and larynx.  The patient was released from suspension and the mouth gag and laryngoscope was removed from the patient's oral cavity without injury to teeth, lips, or gums.   Dispo:   To PACU in good condition  Plan:  Soft Diet, follow up in 1 week for repeat evaluation and review of pathology.  Carolee Athalene Kolle  02/28/2024 9:16 AM

## 2024-02-28 NOTE — Anesthesia Preprocedure Evaluation (Signed)
 Anesthesia Evaluation  Patient identified by MRN, date of birth, ID band Patient awake    Reviewed: Allergy & Precautions, NPO status , Patient's Chart, lab work & pertinent test results  History of Anesthesia Complications Negative for: history of anesthetic complications  Airway Mallampati: III  TM Distance: <3 FB Neck ROM: full    Dental  (+) Chipped, Poor Dentition, Dental Advidsory Given   Pulmonary neg shortness of breath, sleep apnea , neg COPD, neg recent URI, Patient abstained from smoking., former smoker   Pulmonary exam normal        Cardiovascular Exercise Tolerance: Good hypertension, (-) angina (-) Past MI and (-) Cardiac Stents Normal cardiovascular exam(-) dysrhythmias (-) Valvular Problems/Murmurs     Neuro/Psych neg Seizures PSYCHIATRIC DISORDERS Anxiety Depression Bipolar Disorder    Neuromuscular disease    GI/Hepatic Neg liver ROS, hiatal hernia,GERD  Controlled,,  Endo/Other  negative endocrine ROS    Renal/GU negative Renal ROS  negative genitourinary   Musculoskeletal   Abdominal   Peds  Hematology negative hematology ROS (+)   Anesthesia Other Findings Past Medical History: No date: Anxiety No date: Arthritis No date: Bipolar 1 disorder (HCC) No date: Breast cancer (HCC) No date: Depression No date: Fibromyalgia No date: GERD (gastroesophageal reflux disease) No date: Hidradenitis suppurativa No date: History of hiatal hernia No date: Hydradenitis No date: Hyperlipidemia No date: Hypertension No date: Multiple thyroid  nodules     Comment:  benign No date: Sleep apnea     Comment:  No CPAP  Past Surgical History: 04/2011: ABLATION 2009: BREAST BIOPSY; Right     Comment:  INVASIVE MAMMARY CARCINOMA, GRADE 1 04/19/2018: BREAST LUMPECTOMY; Left     Comment:  INVASIVE MAMMARY CARCINOMA, GRADE 1 No date: CESAREAN SECTION     Comment:  x3 No date: CHOLECYSTECTOMY 03/09/2019:  COLONOSCOPY WITH PROPOFOL ; N/A     Comment:  Procedure: COLONOSCOPY WITH PROPOFOL ;  Surgeon: Jinny Carmine, MD;  Location: [facility];  Service:               Endoscopy;  Laterality: N/A;  sleep apnea No date: GALLBLADDER SURGERY 05/27/2016: LYSIS OF ADHESION     Comment:  Procedure: LYSIS OF ADHESION;  Surgeon: Carlin Pastel,              MD;  Location: [facility];  Service: General;; 04/19/2018: PARTIAL MASTECTOMY WITH NEEDLE LOCALIZATION; Left     Comment:  Procedure: PARTIAL MASTECTOMY WITH NEEDLE LOCALIZATION;               Surgeon: Rodolph Romano, MD;  Location: [facility];               Service: General;  Laterality: Left; 05/27/2016: ROBOTIC ASSISTED LAPAROSCOPIC CHOLECYSTECTOMY; N/A     Comment:  Procedure: ROBOTIC ASSISTED LAPAROSCOPIC               CHOLECYSTECTOMY;  Surgeon: Carlin Pastel, MD;                Location: [facility];  Service: General;  Laterality: N/A; 04/19/2018: SENTINEL NODE BIOPSY; Left     Comment:  Procedure: SENTINEL NODE BIOPSY;  Surgeon: Rodolph Romano, MD;  Location: [facility];  Service: General;                Laterality: Left; 2000: SHOULDER ARTHROSCOPY 09/19/2019: TONSILLECTOMY; Right  Comment:  Procedure: PARTIAL VS TOTAL TONSILLECTOMY;  Surgeon:               Milissa Hamming, MD;  Location: [facility];                Service: ENT;  Laterality: Right; No date: TUBAL LIGATION  BMI    Body Mass Index: 27.92 kg/m      Reproductive/Obstetrics negative OB ROS                              Anesthesia Physical Anesthesia Plan  ASA: 2  Anesthesia Plan: General   Post-op Pain Management:    Induction: Intravenous  PONV Risk Score and Plan: 3 and Ondansetron , Dexamethasone , Midazolam  and Treatment may vary due to age or medical condition  Airway Management Planned: Oral ETT  Additional Equipment:   Intra-op Plan:   Post-operative Plan: Extubation in  OR  Informed Consent: I have reviewed the patients History and Physical, chart, labs and discussed the procedure including the risks, benefits and alternatives for the proposed anesthesia with the patient or authorized representative who has indicated his/her understanding and acceptance.     Dental Advisory Given  Plan Discussed with: Anesthesiologist, CRNA and Surgeon  Anesthesia Plan Comments: (Patient consented for risks of anesthesia including but not limited to:  - adverse reactions to medications - risk of airway placement if required - damage to eyes, teeth, lips or other oral mucosa - nerve damage due to positioning  - sore throat or hoarseness - Damage to heart, brain, nerves, lungs, other parts of body or loss of life  Patient voiced understanding and assent.)        Anesthesia Quick Evaluation

## 2024-02-29 ENCOUNTER — Encounter: Payer: Self-pay | Admitting: Otolaryngology

## 2024-02-29 LAB — SURGICAL PATHOLOGY

## 2024-03-07 NOTE — Anesthesia Postprocedure Evaluation (Signed)
 Anesthesia Post Note  Patient: Tiffany Mathews  Procedure(s) Performed: MICROLARYNGOSCOPY, WITH PROCEDURE USING LASER (Left: Throat)  Patient location during evaluation: PACU Anesthesia Type: General Level of consciousness: awake and alert Pain management: pain level controlled Vital Signs Assessment: post-procedure vital signs reviewed and stable Respiratory status: spontaneous breathing, nonlabored ventilation, respiratory function stable and patient connected to nasal cannula oxygen Cardiovascular status: blood pressure returned to baseline and stable Postop Assessment: no apparent nausea or vomiting Anesthetic complications: no   There were no known notable events for this encounter.   Last Vitals:  Vitals:   02/28/24 1000 02/28/24 1012  BP: 119/68 132/74  Pulse: 65 60  Resp: 16 16  Temp: (!) 36.3 C (!) 35.7 C  SpO2: 100% 100%    Last Pain:  Vitals:   02/28/24 1012  TempSrc: Temporal  PainSc: 0-No pain                 Prentice Murphy

## 2024-03-08 ENCOUNTER — Other Ambulatory Visit: Payer: Self-pay | Admitting: General Surgery

## 2024-03-08 DIAGNOSIS — Z1231 Encounter for screening mammogram for malignant neoplasm of breast: Secondary | ICD-10-CM

## 2024-03-20 ENCOUNTER — Ambulatory Visit (INDEPENDENT_AMBULATORY_CARE_PROVIDER_SITE_OTHER)

## 2024-03-20 VITALS — BP 126/80 | Ht 64.25 in | Wt 172.8 lb

## 2024-03-20 DIAGNOSIS — Z0000 Encounter for general adult medical examination without abnormal findings: Secondary | ICD-10-CM | POA: Diagnosis not present

## 2024-03-20 NOTE — Patient Instructions (Signed)
 Ms. Tiffany Mathews,  Thank you for taking the time for your Medicare Wellness Visit. I appreciate your continued commitment to your health goals. Please review the care plan we discussed, and feel free to reach out if I can assist you further.  Medicare recommends these wellness visits once per year to help you and your care team stay ahead of potential health issues. These visits are designed to focus on prevention, allowing your provider to concentrate on managing your acute and chronic conditions during your regular appointments.  Please note that Annual Wellness Visits do not include a physical exam. Some assessments may be limited, especially if the visit was conducted virtually. If needed, we may recommend a separate in-person follow-up with your provider.  Ongoing Care Seeing your primary care provider every 3 to 6 months helps us  monitor your health and provide consistent, personalized care.   Referrals If a referral was made during today's visit and you haven't received any updates within two weeks, please contact the referred provider directly to check on the status.  Recommended Screenings:  Health Maintenance  Topic Date Due   Zoster (Shingles) Vaccine (1 of 2) Never done   COVID-19 Vaccine (4 - 2025-26 season) 02/06/2024   Colon Cancer Screening  03/08/2024   Breast Cancer Screening  04/18/2024   Flu Shot  09/04/2024*   Medicare Annual Wellness Visit  03/20/2025   Pap with HPV screening  12/08/2026   DTaP/Tdap/Td vaccine (2 - Td or Tdap) 04/15/2031   Pneumococcal Vaccine for age over 41  Completed   Hepatitis C Screening  Completed   HIV Screening  Completed   Hepatitis B Vaccine  Aged Out   HPV Vaccine  Aged Out   Meningitis B Vaccine  Aged Out  *Topic was postponed. The date shown is not the original due date.       02/28/2024    7:46 AM  Advanced Directives  Does Patient Have a Medical Advance Directive? No  Would patient like information on creating a medical advance  directive? No - Patient declined   Advance Care Planning is important because it: Ensures you receive medical care that aligns with your values, goals, and preferences. Provides guidance to your family and loved ones, reducing the emotional burden of decision-making during critical moments.  Vision: Annual vision screenings are recommended for early detection of glaucoma, cataracts, and diabetic retinopathy. These exams can also reveal signs of chronic conditions such as diabetes and high blood pressure.  Dental: Annual dental screenings help detect early signs of oral cancer, gum disease, and other conditions linked to overall health, including heart disease and diabetes.

## 2024-03-20 NOTE — Progress Notes (Signed)
 Subjective:   Tiffany Mathews is a 63 y.o. who presents for a Medicare Wellness preventive visit.  As a reminder, Annual Wellness Visits don't include a physical exam, and some assessments may be limited, especially if this visit is performed virtually. We may recommend an in-person follow-up visit with your provider if needed.  Visit Complete: In person  Persons Participating in Visit: Patient.  AWV Questionnaire: Yes: Patient Medicare AWV questionnaire was completed by the patient on 03/19/24; I have confirmed that all information answered by patient is correct and no changes since this date.  Cardiac Risk Factors include: advanced age (>29men, >65 women);dyslipidemia;hypertension     Objective:    Today's Vitals   03/19/24 1206 03/20/24 1008  BP:  126/80  Weight:  172 lb 12.8 oz (78.4 kg)  Height:  5' 4.25 (1.632 m)  PainSc: 9     Body mass index is 29.43 kg/m.     03/20/2024   10:36 AM 02/28/2024    7:46 AM 02/21/2024   10:10 AM 05/11/2023    9:36 AM 04/01/2023    9:43 AM 03/07/2023    8:29 AM 04/14/2022   11:11 AM  Advanced Directives  Does Patient Have a Medical Advance Directive? No No No No No No No  Would patient like information on creating a medical advance directive? No - Patient declined No - Patient declined No - Patient declined No - Patient declined No - Patient declined No - Patient declined No - Patient declined    Current Medications (verified) Outpatient Encounter Medications as of 03/20/2024  Medication Sig   aspirin EC 81 MG tablet Take 81 mg by mouth daily.    atorvastatin  (LIPITOR) 40 MG tablet Take 1 tablet by mouth once daily   clonazePAM  (KLONOPIN ) 0.5 MG tablet Take 0.5 mg by mouth 2 (two) times daily as needed.   diltiazem  (CARDIZEM  CD) 180 MG 24 hr capsule Take 1 capsule (180 mg total) by mouth daily.   fluticasone  (FLONASE ) 50 MCG/ACT nasal spray Place 1 spray into both nostrils 2 (two) times daily.   gabapentin  (NEURONTIN ) 300 MG capsule  Take 300 mg by mouth 3 (three) times daily as needed.   losartan  (COZAAR ) 100 MG tablet Take 1 tablet (100 mg total) by mouth daily.   Multiple Vitamins-Minerals (MULTIVITAMIN WITH MINERALS) tablet Take 1 tablet by mouth daily.   traZODone  (DESYREL ) 100 MG tablet Take 100 mg by mouth at bedtime as needed (sleep).    venlafaxine  XR (EFFEXOR -XR) 75 MG 24 hr capsule Take 1 capsule by mouth daily.   VITAMIN D PO Take by mouth daily.   ziprasidone  (GEODON ) 40 MG capsule Take 40 mg by mouth at bedtime as needed.   ondansetron  (ZOFRAN ) 4 MG tablet Take 1 tablet (4 mg total) by mouth every 8 (eight) hours as needed for nausea or vomiting.   No facility-administered encounter medications on file as of 03/20/2024.    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   Multiple thyroid   nodules    benign   Polyarthralgia 12/25/2015   Pre-diabetes    Sleep apnea    No CPAP   Past Surgical History:  Procedure Laterality Date   ABLATION  04/2011   BREAST BIOPSY Right 2009   INVASIVE MAMMARY CARCINOMA, GRADE 1   BREAST LUMPECTOMY Left 04/19/2018   INVASIVE MAMMARY CARCINOMA, GRADE 1   CESAREAN SECTION     x3   CHOLECYSTECTOMY     COLONOSCOPY WITH PROPOFOL  N/A 03/09/2019   Procedure: COLONOSCOPY WITH PROPOFOL ;  Surgeon: Jinny Carmine, MD;  Location[facility];  Service: Endoscopy;  Laterality: N/A;  sleep apnea   ESOPHAGOGASTRODUODENOSCOPY (EGD) WITH PROPOFOL  N/A 04/01/2023   Procedure: ESOPHAGOGASTRODUODENOSCOPY (EGD) WITH  PROPOFOL ;  Surgeon: Jinny Carmine, MD;  Location: [facility];  Service: Endoscopy;  Laterality: N/A;   GALLBLADDER SURGERY     LYSIS OF ADHESION  05/27/2016   Procedure: LYSIS OF ADHESION;  Surgeon: Carlin Pastel, MD;  Location: [facility];  Service: General;;   MICROLARYNGOSCOPY WITH LASER Left 02/28/2024   Procedure: MICROLARYNGOSCOPY, WITH PROCEDURE USING LASER;  Surgeon: Milissa Hamming, MD;  Location: [facility];  Service: ENT;  Laterality: Left;  SUSPENSION LARYNGOSCOPY WITH MICROFLAP EXCISION LEFT VOCAL CORD LESION   PARTIAL MASTECTOMY WITH NEEDLE LOCALIZATION Left 04/19/2018   Procedure: PARTIAL MASTECTOMY WITH NEEDLE LOCALIZATION;  Surgeon: Rodolph Romano, MD;  Location: [facility];  Service: General;  Laterality: Left;   ROBOTIC ASSISTED LAPAROSCOPIC CHOLECYSTECTOMY N/A 05/27/2016   Procedure: ROBOTIC ASSISTED LAPAROSCOPIC CHOLECYSTECTOMY;  Surgeon: Carlin Pastel, MD;  Location: [facility];  Service: General;  Laterality: N/A;   SENTINEL NODE BIOPSY Left 04/19/2018   Procedure: SENTINEL NODE BIOPSY;  Surgeon: Rodolph Romano, MD;  Location: [facility];  Service: General;  Laterality: Left;   SHOULDER ARTHROSCOPY  2000   TONSILLECTOMY Right 09/19/2019   Procedure: PARTIAL VS TOTAL TONSILLECTOMY;  Surgeon: Milissa Hamming, MD;  Location: [facility];  Service: ENT;  Laterality: Right;   TUBAL LIGATION     Family History  Problem Relation Age of Onset   Thyroid  cancer Mother        dx 35s; currently 81   Hypertension Mother    Diabetes Mother    Arthritis Mother    Cancer Mother    Alzheimer's disease Father        deceased 23   Stroke Father    Varicose Veins Father    Drug abuse Sister        deceased 59   Early death Sister    Diabetes Brother    Hypertension Brother    Diabetes Brother    Hypertension Brother    Alcohol abuse Brother    Diabetes Brother    Schizophrenia Brother    Heart attack Maternal Grandfather    Stomach cancer Paternal  Grandfather    Breast cancer Maternal Aunt 45       deceased 32s   Uterine cancer Maternal Aunt    Ovarian cancer Maternal Aunt    Stomach cancer Paternal Aunt        age at dx unknown   Stomach cancer Paternal Uncle        age at dx unknown   Breast cancer Other        dx 30s   Ovarian cancer Other        dx 2s   ADD / ADHD Daughter    Anxiety disorder Daughter    Social History   Socioeconomic History   Marital status: Divorced  Spouse name: Not on file   Number of children: Not on file   Years of education: Not on file   Highest education level: 12th grade  Occupational History   Not on file  Tobacco Use   Smoking status: Former    Current packs/day: 0.50    Average packs/day: 0.5 packs/day for 32.8 years (16.4 ttl pk-yrs)    Types: Cigarettes    Start date: 64   Smokeless tobacco: Never   Tobacco comments:    started around 1993  Vaping Use   Vaping status: Former  Substance and Sexual Activity   Alcohol use: No   Drug use: No   Sexual activity: Not Currently    Birth control/protection: Abstinence  Other Topics Concern   Not on file  Social History Narrative   Not on file   Social Drivers of Health   Financial Resource Strain: Medium Risk (03/19/2024)   Overall Financial Resource Strain (CARDIA)    Difficulty of Paying Living Expenses: Somewhat hard  Food Insecurity: No Food Insecurity (03/19/2024)   Hunger Vital Sign    Worried About Running Out of Food in the Last Year: Never true    Ran Out of Food in the Last Year: Never true  Transportation Needs: No Transportation Needs (03/19/2024)   PRAPARE - Administrator, Civil Service (Medical): No    Lack of Transportation (Non-Medical): No  Physical Activity: Insufficiently Active (03/19/2024)   Exercise Vital Sign    Days of Exercise per Week: 3 days    Minutes of Exercise per Session: 20 min  Stress: Stress Concern Present (03/19/2024)   [employer] of Occupational Health -  Occupational Stress Questionnaire    Feeling of Stress: Rather much  Social Connections: Socially Isolated (03/19/2024)   Social Connection and Isolation Panel    Frequency of Communication with Friends and Family: More than three times a week    Frequency of Social Gatherings with Friends and Family: Patient declined    Attends Religious Services: Never    Database administrator or Organizations: No    Attends Engineer, structural: Not on file    Marital Status: Divorced    Tobacco Counseling Counseling given: Not Answered Tobacco comments: started around 1993    Clinical Intake:  Pre-visit preparation completed: Yes  Pain : 0-10 Pain Score: 9  Pain Type: Chronic pain Pain Location: Back (neck) Pain Orientation: Upper Pain Descriptors / Indicators: Aching Pain Onset: More than a month ago Pain Frequency: Constant Pain Relieving Factors: heat, arthritic creams, advil  Pain Relieving Factors: heat, arthritic creams, advil  BMI - recorded: 29.43 Nutritional Status: BMI 25 -29 Overweight Nutritional Risks: None Diabetes: No  Lab Results  Component Value Date   HGBA1C 6.3 01/13/2024   HGBA1C 5.9 02/02/2023   HGBA1C 5.4 04/14/2021     How often do you need to have someone help you when you read instructions, pamphlets, or other written materials from your doctor or pharmacy?: 1 - Never  Interpreter Needed?: No  Comments: lives with son, daughter and grands Information entered by :: B.Nakshatra Klose,LPN   Activities of Daily Living     03/19/2024   12:06 PM 02/28/2024    7:52 AM  In your present state of health, do you have any difficulty performing the following activities:  Hearing? 1 0  Vision? 0 0  Comment  wears glasses  Difficulty concentrating or making decisions? 1 0  Walking or climbing stairs? 0   Dressing  or bathing? 0   Doing errands, shopping? 1   Preparing Food and eating ? Y   Using the Toilet? N   In the past six months, have you  accidently leaked urine? N   Do you have problems with loss of bowel control? N   Managing your Medications? N   Managing your Finances? N   Housekeeping or managing your Housekeeping? Y     Patient Care Team: Vincente Shivers, NP as PCP - General (General Practice) Lenn Aran, MD as Radiation Oncologist (Radiation Oncology) Melanee Annah BROCKS, MD as Consulting Physician (Oncology)  I have updated your Care Teams any recent Medical Services you may have received from other providers in the past year.     Assessment:   This is a routine wellness examination for Tiffany Mathews.  Hearing/Vision screen Hearing Screening - Comments:: Patient denies any hearing difficulties.   Vision Screening - Comments:: Pt says their vision is good with glasses [facility]   Goals Addressed             This Visit's Progress    Patient Stated       Pt says she would like to lose 20lbs by healthy eating, increasing water  intake, walking a little more       Depression Screen     03/20/2024   10:21 AM 01/13/2024    9:15 AM 11/18/2023   10:31 AM 04/09/2021    9:35 AM 04/09/2021    9:13 AM  PHQ 2/9 Scores  PHQ - 2 Score 2 1 1  0 0  PHQ- 9 Score 3 6       Fall Risk     03/19/2024   12:06 PM 01/13/2024    9:15 AM 11/18/2023   10:30 AM  Fall Risk   Falls in the past year? 0 0   Number falls in past yr: 0 0 0  Injury with Fall? 0  0  Risk for fall due to : No Fall Risks No Fall Risks No Fall Risks  Follow up Education provided;Falls prevention discussed  Falls evaluation completed    MEDICARE RISK AT HOME:  Medicare Risk at Home Any stairs in or around the home?: (Patient-Rptd) Yes If so, are there any without handrails?: (Patient-Rptd) No Home free of loose throw rugs in walkways, pet beds, electrical cords, etc?: (Patient-Rptd) Yes Adequate lighting in your home to reduce risk of falls?: (Patient-Rptd) Yes Life alert?: (Patient-Rptd) No Use of a cane, walker or w/c?: (Patient-Rptd)  No Grab bars in the bathroom?: (Patient-Rptd) No Shower chair or bench in shower?: (Patient-Rptd) No Elevated toilet seat or a handicapped toilet?: (Patient-Rptd) No  TIMED UP AND GO:  Was the test performed?  Yes  Length of time to ambulate 10 feet: 13 sec Gait steady and fast without use of assistive device  Cognitive Function: 6CIT completed    04/09/2021    9:37 AM  MMSE - Mini Mental State Exam  Not completed: Unable to complete        03/20/2024   10:38 AM 04/09/2021    9:37 AM  6CIT Screen  What Year? 0 points   What month? 0 points   What time? 0 points 0 points  Count back from 20 0 points 0 points  Months in reverse 0 points 0 points  Repeat phrase 0 points 0 points  Total Score 0 points     Immunizations Immunization History  Administered Date(s) Administered   Influenza,inj,Quad PF,6+ Mos 05/05/2020, 04/14/2021  Moderna Sars-Covid-2 Vaccination 09/06/2019, 10/29/2019, 05/09/2020   PNEUMOCOCCAL CONJUGATE-20 01/13/2024   Pneumococcal Polysaccharide-23 05/15/2014   Tdap 04/14/2021    Screening Tests Health Maintenance  Topic Date Due   Zoster Vaccines- Shingrix (1 of 2) Never done   COVID-19 Vaccine (4 - 2025-26 season) 02/06/2024   Colonoscopy  03/08/2024   Mammogram  04/18/2024   Influenza Vaccine  09/04/2024 (Originally 01/06/2024)   Medicare Annual Wellness (AWV)  03/20/2025   Cervical Cancer Screening (HPV/Pap Cotest)  12/08/2026   DTaP/Tdap/Td (2 - Td or Tdap) 04/15/2031   Pneumococcal Vaccine: 50+ Years  Completed   Hepatitis C Screening  Completed   HIV Screening  Completed   Hepatitis B Vaccines 19-59 Average Risk  Aged Out   HPV VACCINES  Aged Out   Meningococcal B Vaccine  Aged Out    Health Maintenance Items Addressed: Pt will get Covid and Shingles from her pharmacy if desires to obtain Colonoscopy being scheduled  Additional Screening:  Vision Screening: Recommended annual ophthalmology exams for early detection of glaucoma and  other disorders of the eye. Is the patient up to date with their annual eye exam?  Yes  Who is the provider or what is the name of the office in which the patient attends annual eye exams? [facility]  Dental Screening: Recommended annual dental exams for proper oral hygiene  Community Resource Referral / Chronic Care Management: CRR required this visit?  No   CCM required this visit?  Appt scheduled with PCP   Plan:    I have personally reviewed and noted the following in the patient's chart:   Medical and social history Use of alcohol, tobacco or illicit drugs  Current medications and supplements including opioid prescriptions. Patient is not currently taking opioid prescriptions. Functional ability and status Nutritional status Physical activity Advanced directives List of other physicians Hospitalizations, surgeries, and ER visits in previous 12 months Vitals Screenings to include cognitive, depression, and falls Referrals and appointments  In addition, I have reviewed and discussed with patient certain preventive protocols, quality metrics, and best practice recommendations. A written personalized care plan for preventive services as well as general preventive health recommendations were provided to patient.   Erminio LITTIE Saris, LPN   [id number]   After Visit Summary: (In Person-Declined) Patient declined AVS at this time.  Notes: Nothing significant to report at this time.

## 2024-03-28 ENCOUNTER — Ambulatory Visit: Admitting: General Practice

## 2024-04-18 ENCOUNTER — Ambulatory Visit: Admitting: General Practice

## 2024-04-20 ENCOUNTER — Encounter

## 2024-04-26 ENCOUNTER — Other Ambulatory Visit: Payer: Self-pay | Admitting: General Practice

## 2024-04-26 DIAGNOSIS — I10 Essential (primary) hypertension: Secondary | ICD-10-CM

## 2024-05-24 ENCOUNTER — Inpatient Hospital Stay: Admission: RE | Admit: 2024-05-24 | Discharge: 2024-05-24 | Attending: General Surgery

## 2024-05-24 DIAGNOSIS — Z1231 Encounter for screening mammogram for malignant neoplasm of breast: Secondary | ICD-10-CM | POA: Diagnosis present

## 2024-07-18 ENCOUNTER — Ambulatory Visit: Admitting: General Practice

## 2025-03-21 ENCOUNTER — Ambulatory Visit

## 2025-03-22 ENCOUNTER — Ambulatory Visit
